# Patient Record
Sex: Male | Born: 1965 | Race: Black or African American | Hispanic: No | Smoking: Never smoker
Health system: Southern US, Community
[De-identification: ages and names within clinical notes are randomized; demographics above are authoritative.]

## PROBLEM LIST (undated history)

## (undated) DIAGNOSIS — E0590 Thyrotoxicosis, unspecified without thyrotoxic crisis or storm: Secondary | ICD-10-CM

## (undated) DIAGNOSIS — G63 Polyneuropathy in diseases classified elsewhere: Secondary | ICD-10-CM

## (undated) DIAGNOSIS — Z992 Dependence on renal dialysis: Secondary | ICD-10-CM

## (undated) DIAGNOSIS — E785 Hyperlipidemia, unspecified: Secondary | ICD-10-CM

## (undated) DIAGNOSIS — G4730 Sleep apnea, unspecified: Secondary | ICD-10-CM

## (undated) DIAGNOSIS — N186 End stage renal disease: Secondary | ICD-10-CM

## (undated) DIAGNOSIS — Z9884 Bariatric surgery status: Secondary | ICD-10-CM

## (undated) DIAGNOSIS — I10 Essential (primary) hypertension: Secondary | ICD-10-CM

## (undated) DIAGNOSIS — E349 Endocrine disorder, unspecified: Secondary | ICD-10-CM

## (undated) DIAGNOSIS — E079 Disorder of thyroid, unspecified: Secondary | ICD-10-CM

## (undated) HISTORY — PX: OTHER SURGICAL HISTORY: SHX169

---

## 2007-04-18 ENCOUNTER — Encounter: Admission: RE | Admit: 2007-04-18 | Discharge: 2007-04-18 | Payer: Self-pay | Admitting: Nephrology

## 2007-11-21 ENCOUNTER — Encounter: Admission: RE | Admit: 2007-11-21 | Discharge: 2007-11-21 | Payer: Self-pay | Admitting: Surgery

## 2009-03-12 HISTORY — PX: DIALYSIS FISTULA CREATION: SHX611

## 2009-05-30 ENCOUNTER — Ambulatory Visit: Payer: Self-pay | Admitting: Surgery

## 2009-06-10 ENCOUNTER — Ambulatory Visit (HOSPITAL_COMMUNITY): Admission: RE | Admit: 2009-06-10 | Discharge: 2009-06-10 | Payer: Self-pay | Admitting: Surgery

## 2009-06-10 ENCOUNTER — Ambulatory Visit: Payer: Self-pay | Admitting: Surgery

## 2009-07-11 ENCOUNTER — Ambulatory Visit: Payer: Self-pay | Admitting: Surgery

## 2009-10-10 ENCOUNTER — Ambulatory Visit: Payer: Self-pay | Admitting: Surgery

## 2009-10-29 ENCOUNTER — Encounter: Admission: RE | Admit: 2009-10-29 | Discharge: 2010-01-27 | Payer: Self-pay | Admitting: Surgery

## 2009-12-06 ENCOUNTER — Inpatient Hospital Stay (HOSPITAL_COMMUNITY)
Admission: RE | Admit: 2009-12-06 | Discharge: 2009-12-08 | Payer: Self-pay | Source: Home / Self Care | Admitting: Surgery

## 2009-12-06 HISTORY — PX: GASTRIC RESTRICTION SURGERY: SHX653

## 2009-12-07 ENCOUNTER — Encounter (INDEPENDENT_AMBULATORY_CARE_PROVIDER_SITE_OTHER): Payer: Self-pay | Admitting: Surgery

## 2009-12-07 ENCOUNTER — Ambulatory Visit: Payer: Self-pay | Admitting: Vascular Surgery

## 2009-12-20 ENCOUNTER — Encounter
Admission: RE | Admit: 2009-12-20 | Discharge: 2010-03-20 | Payer: Self-pay | Source: Home / Self Care | Attending: Surgery | Admitting: Surgery

## 2010-01-09 ENCOUNTER — Encounter: Payer: Self-pay | Admitting: Emergency Medicine

## 2010-01-09 ENCOUNTER — Inpatient Hospital Stay (HOSPITAL_COMMUNITY): Admission: EM | Admit: 2010-01-09 | Discharge: 2010-01-12 | Payer: Self-pay | Admitting: Internal Medicine

## 2010-01-23 ENCOUNTER — Emergency Department (HOSPITAL_COMMUNITY): Admission: EM | Admit: 2010-01-23 | Discharge: 2010-01-23 | Payer: Self-pay | Admitting: Emergency Medicine

## 2010-01-30 ENCOUNTER — Encounter
Admission: RE | Admit: 2010-01-30 | Discharge: 2010-04-11 | Payer: Self-pay | Source: Home / Self Care | Attending: Surgery | Admitting: Surgery

## 2010-02-09 ENCOUNTER — Encounter (HOSPITAL_COMMUNITY)
Admission: RE | Admit: 2010-02-09 | Discharge: 2010-04-11 | Payer: Self-pay | Source: Home / Self Care | Attending: Nephrology | Admitting: Nephrology

## 2010-02-16 ENCOUNTER — Encounter
Admission: RE | Admit: 2010-02-16 | Discharge: 2010-02-16 | Payer: Self-pay | Source: Home / Self Care | Attending: General Surgery | Admitting: General Surgery

## 2010-04-02 ENCOUNTER — Encounter: Payer: Self-pay | Admitting: Nephrology

## 2010-05-11 ENCOUNTER — Other Ambulatory Visit: Payer: Self-pay

## 2010-05-11 ENCOUNTER — Encounter (HOSPITAL_COMMUNITY): Payer: 59 | Attending: Nephrology

## 2010-05-11 DIAGNOSIS — D638 Anemia in other chronic diseases classified elsewhere: Secondary | ICD-10-CM | POA: Insufficient documentation

## 2010-05-11 DIAGNOSIS — N186 End stage renal disease: Secondary | ICD-10-CM

## 2010-05-11 DIAGNOSIS — N184 Chronic kidney disease, stage 4 (severe): Secondary | ICD-10-CM | POA: Insufficient documentation

## 2010-05-11 HISTORY — DX: Dependence on renal dialysis: N18.6

## 2010-05-23 LAB — CBC
MCH: 28.7 pg (ref 26.0–34.0)
MCHC: 33.7 g/dL (ref 30.0–36.0)
MCV: 85.2 fL (ref 78.0–100.0)
RBC: 3.48 MIL/uL — ABNORMAL LOW (ref 4.22–5.81)

## 2010-05-23 LAB — RENAL FUNCTION PANEL
Albumin: 2.9 g/dL — ABNORMAL LOW (ref 3.5–5.2)
Albumin: 3.2 g/dL — ABNORMAL LOW (ref 3.5–5.2)
BUN: 37 mg/dL — ABNORMAL HIGH (ref 6–23)
BUN: 39 mg/dL — ABNORMAL HIGH (ref 6–23)
CO2: 23 mEq/L (ref 19–32)
Creatinine, Ser: 5.27 mg/dL — ABNORMAL HIGH (ref 0.4–1.5)
GFR calc Af Amer: 14 mL/min — ABNORMAL LOW (ref >60)
GFR calc non Af Amer: 12 mL/min — ABNORMAL LOW (ref >60)
Glucose, Bld: 80 mg/dL (ref 70–99)
Phosphorus: 4.3 mg/dL (ref 2.3–4.6)
Potassium: 4 mEq/L (ref 3.5–5.1)
Potassium: 4.9 mEq/L (ref 3.5–5.1)

## 2010-05-23 LAB — COMPREHENSIVE METABOLIC PANEL
Alkaline Phosphatase: 68 U/L (ref 39–117)
Calcium: 8.1 mg/dL — ABNORMAL LOW (ref 8.4–10.5)
Chloride: 109 mEq/L (ref 96–112)
Creatinine, Ser: 5.48 mg/dL — ABNORMAL HIGH (ref 0.4–1.5)
GFR calc non Af Amer: 11 mL/min — ABNORMAL LOW (ref >60)
Glucose, Bld: 141 mg/dL — ABNORMAL HIGH (ref 70–99)
Total Bilirubin: 0.6 mg/dL (ref 0.3–1.2)
Total Protein: 8.3 g/dL (ref 6.0–8.3)

## 2010-05-23 LAB — DIFFERENTIAL
Eosinophils Absolute: 0 10*3/uL (ref 0.0–0.7)
Lymphs Abs: 0.6 10*3/uL — ABNORMAL LOW (ref 0.7–4.0)
Monocytes Relative: 3 % (ref 3–12)
Neutrophils Relative %: 91 % — ABNORMAL HIGH (ref 43–77)

## 2010-05-23 LAB — LIPASE, BLOOD
Lipase: 37 U/L (ref 11–59)
Lipase: 46 U/L (ref 11–59)

## 2010-05-24 LAB — BASIC METABOLIC PANEL
CO2: 19 mEq/L (ref 19–32)
Calcium: 7.9 mg/dL — ABNORMAL LOW (ref 8.4–10.5)
Creatinine, Ser: 5.78 mg/dL — ABNORMAL HIGH (ref 0.4–1.5)
GFR calc non Af Amer: 11 mL/min — ABNORMAL LOW (ref >60)

## 2010-05-24 LAB — CBC
MCHC: 33.9 g/dL (ref 30.0–36.0)
Platelets: 151 10*3/uL (ref 150–400)
RBC: 4.06 MIL/uL — ABNORMAL LOW (ref 4.22–5.81)
WBC: 7.7 10*3/uL (ref 4.0–10.5)

## 2010-05-24 LAB — GLUCOSE, CAPILLARY: Glucose-Capillary: 147 mg/dL — ABNORMAL HIGH (ref 70–99)

## 2010-05-24 LAB — COMPREHENSIVE METABOLIC PANEL
AST: 24 U/L (ref 0–37)
Alkaline Phosphatase: 89 U/L (ref 39–117)
BUN: 55 mg/dL — ABNORMAL HIGH (ref 6–23)
CO2: 17 mEq/L — ABNORMAL LOW (ref 19–32)
Chloride: 114 mEq/L — ABNORMAL HIGH (ref 96–112)
GFR calc non Af Amer: 9 mL/min — ABNORMAL LOW (ref >60)
Glucose, Bld: 151 mg/dL — ABNORMAL HIGH (ref 70–99)
Sodium: 142 mEq/L (ref 135–145)
Total Bilirubin: 0.6 mg/dL (ref 0.3–1.2)
Total Protein: 8.3 g/dL (ref 6.0–8.3)

## 2010-05-24 LAB — DIFFERENTIAL
Eosinophils Relative: 0 % (ref 0–5)
Monocytes Absolute: 0.1 10*3/uL (ref 0.1–1.0)
Monocytes Relative: 2 % — ABNORMAL LOW (ref 3–12)
Neutrophils Relative %: 93 % — ABNORMAL HIGH (ref 43–77)

## 2010-05-25 LAB — BASIC METABOLIC PANEL
BUN: 90 mg/dL — ABNORMAL HIGH (ref 6–23)
CO2: 15 mEq/L — ABNORMAL LOW (ref 19–32)
CO2: 17 mEq/L — ABNORMAL LOW (ref 19–32)
Calcium: 7.1 mg/dL — ABNORMAL LOW (ref 8.4–10.5)
Chloride: 114 mEq/L — ABNORMAL HIGH (ref 96–112)
GFR calc Af Amer: 11 mL/min — ABNORMAL LOW (ref >60)
GFR calc non Af Amer: 8 mL/min — ABNORMAL LOW (ref >60)
GFR calc non Af Amer: 9 mL/min — ABNORMAL LOW (ref >60)
Glucose, Bld: 185 mg/dL — ABNORMAL HIGH (ref 70–99)
Potassium: 5.3 mEq/L — ABNORMAL HIGH (ref 3.5–5.1)
Potassium: 5.8 mEq/L — ABNORMAL HIGH (ref 3.5–5.1)
Sodium: 138 mEq/L (ref 135–145)
Sodium: 141 mEq/L (ref 135–145)

## 2010-05-25 LAB — CBC
HCT: 31.1 % — ABNORMAL LOW (ref 39.0–52.0)
Hemoglobin: 10.3 g/dL — ABNORMAL LOW (ref 13.0–17.0)
MCH: 28.1 pg (ref 26.0–34.0)
MCH: 28.5 pg (ref 26.0–34.0)
MCHC: 34.4 g/dL (ref 30.0–36.0)
MCV: 82.9 fL (ref 78.0–100.0)
RBC: 3.67 MIL/uL — ABNORMAL LOW (ref 4.22–5.81)
WBC: 8.9 10*3/uL (ref 4.0–10.5)

## 2010-05-25 LAB — COMPREHENSIVE METABOLIC PANEL
BUN: 89 mg/dL — ABNORMAL HIGH (ref 6–23)
CO2: 19 mEq/L (ref 19–32)
Calcium: 7.6 mg/dL — ABNORMAL LOW (ref 8.4–10.5)
Creatinine, Ser: 6.94 mg/dL — ABNORMAL HIGH (ref 0.4–1.5)
GFR calc Af Amer: 11 mL/min — ABNORMAL LOW (ref >60)
Glucose, Bld: 91 mg/dL (ref 70–99)
Sodium: 139 mEq/L (ref 135–145)
Total Bilirubin: 0.8 mg/dL (ref 0.3–1.2)
Total Protein: 8.2 g/dL (ref 6.0–8.3)

## 2010-05-25 LAB — DIFFERENTIAL
Eosinophils Absolute: 0.1 10*3/uL (ref 0.0–0.7)
Eosinophils Relative: 1 % (ref 0–5)
Lymphocytes Relative: 20 % (ref 12–46)
Lymphs Abs: 1.4 10*3/uL (ref 0.7–4.0)
Lymphs Abs: 1.7 10*3/uL (ref 0.7–4.0)
Monocytes Absolute: 0.6 10*3/uL (ref 0.1–1.0)
Monocytes Absolute: 0.6 10*3/uL (ref 0.1–1.0)
Monocytes Relative: 7 % (ref 3–12)
Monocytes Relative: 7 % (ref 3–12)
Neutro Abs: 7.5 10*3/uL (ref 1.7–7.7)
Neutrophils Relative %: 77 % (ref 43–77)

## 2010-05-25 LAB — SURGICAL PCR SCREEN: Staphylococcus aureus: NEGATIVE

## 2010-05-26 ENCOUNTER — Other Ambulatory Visit: Payer: Self-pay | Admitting: Nephrology

## 2010-05-26 ENCOUNTER — Other Ambulatory Visit: Payer: Self-pay

## 2010-05-26 ENCOUNTER — Encounter (HOSPITAL_COMMUNITY): Payer: 59

## 2010-05-26 LAB — PHOSPHORUS: Phosphorus: 7.5 mg/dL — ABNORMAL HIGH (ref 2.3–4.6)

## 2010-05-26 LAB — COMPREHENSIVE METABOLIC PANEL
ALT: 25 U/L (ref 0–53)
Alkaline Phosphatase: 76 U/L (ref 39–117)
CO2: 21 mEq/L (ref 19–32)
GFR calc non Af Amer: 6 mL/min — ABNORMAL LOW (ref >60)
Glucose, Bld: 92 mg/dL (ref 70–99)
Potassium: 5.4 mEq/L — ABNORMAL HIGH (ref 3.5–5.1)
Sodium: 140 mEq/L (ref 135–145)
Total Bilirubin: 0.6 mg/dL (ref 0.3–1.2)

## 2010-05-31 LAB — POCT I-STAT 4, (NA,K, GLUC, HGB,HCT)
Glucose, Bld: 91 mg/dL (ref 70–99)
HCT: 37 % — ABNORMAL LOW (ref 39.0–52.0)
Hemoglobin: 12.6 g/dL — ABNORMAL LOW (ref 13.0–17.0)

## 2010-06-09 ENCOUNTER — Encounter (HOSPITAL_COMMUNITY): Payer: 59

## 2010-07-25 NOTE — Procedures (Signed)
CEPHALIC VEIN MAPPING   INDICATION:  Evaluation for AVF placement.   HISTORY:  Renal failure.   EXAM:   The right cephalic vein is compressible.   Diameter measurements range from 0.25 to 0.62 cm.   The right basilic vein is compressible.   Diameter measurements range from 0.30 to 0.69 cm.   The left cephalic vein is compressible.   Diameter measurements range from 0.26 to 0.41 cm.   The left basilic vein is compressible.   Diameter measurements range from 0.28 to 0.32 cm in the forearm only.   See attached worksheet for all measurements.   IMPRESSION:  Patient's bilateral cephalic and right basilic veins are of  acceptable diameter for use as a dialysis access site.   ___________________________________________  V. Charlena Cross, MD   CJ/MEDQ  D:  05/30/2009  T:  05/31/2009  Job:  [id number]

## 2010-07-25 NOTE — Procedures (Signed)
VASCULAR LAB EXAM   INDICATION:  Status post left arm AV fistula.   HISTORY:  Diabetes:  Cardiac:  Hypertension:   EXAM:  Left upper extremity AV fistula Duplex.   IMPRESSION:  1. Patent left radiocephalic arteriovenous graft fistula with an      increased velocity of 673 cm/sec noted at the anastomosis with no      focal internal narrowing noted.  2. Chronic partially occlusive fibrous thrombus noted in the left      proximal antecubital fossa level outflow vein with an increased      velocity of 415 cm/sec noted.  3. Patent branches noted in the left arm outflow veins, as described      on the attached worksheet.  4. Doppler velocities and waveforms of the left radial artery distal      to the anastomosis, with and without fistular compression, suggests      a possible significant fistular steal.  5. Additional depth, diameter, and velocity measurements are noted on      the attached worksheet.   ___________________________________________  V. Charlena Cross, MD   CH/MEDQ  D:  10/11/2009  T:  10/11/2009  Job:  [id number]

## 2010-07-25 NOTE — Assessment & Plan Note (Signed)
OFFICE VISIT   Randall [name], Randall [name]  DOB:  05/03/1965                                       07/11/2009  CHART#:[id number]   The patient comes back in today.  He is status post left radiocephalic  fistula placed on June 10, 2009.  He is doing well at this time.  He has  no complaints.  He has no complaints of steal syndrome.  He has no  numbness.  On examination the cephalic vein is readily palpable in the  forearm.  It has not quite fully matured.  He is due to see Dr. Caryn Section back  in 4 months.  I told that I would like to see him back in 2 months with  an ultrasound to evaluate his fistula to see if there is anything that  may need to be done for his fistula to reach full maturation.  Again I  will see him back in 2 months.     Jorge Ny, MD  Electronically Signed   VWB/MEDQ  D:  07/11/2009  T:  07/12/2009  Job:  [id number]   cc:   Dr. Caryn Section

## 2010-07-25 NOTE — Assessment & Plan Note (Signed)
OFFICE VISIT   ESPN, ZEMAN  DOB:  03-02-1966                                       05/30/2009  CHART#:[id number]   REASON FOR CONSULT:  Dialysis access.   REFERRING PHYSICIAN:  Dr. Caryn Section.   HISTORY:  This is a very pleasant 45 year old gentleman with chronic  kidney disease secondary to hypertension.  He comes in today to discuss  permanent access.  He is right-handed.   The patient is status post Roux-en-Y laparoscopic gastric bypass in 2002  and cholecystectomy in 2009.  He has been made inactive on the  transplant list secondary to his weight; therefore, he comes in today  for his first access.   REVIEW OF SYSTEMS:  Negative for chest pain, negative for shortness of  breath.  Positive for weight loss.  Current weight is 317.  All other  review of systems are negative.   PAST MEDICAL HISTORY:  Hypertension, hypercholesterolemia, chronic  kidney disease.   FAMILY HISTORY:  Negative for cardiovascular disease at an early age.   SOCIAL HISTORY:  He is married with 3 children.  He works as an Therapist, music.  Does not drink or smoke.   CURRENT MEDICATIONS:  Please see medical record.   ALLERGIES:  None.   PHYSICAL EXAMINATION:  Heart rate 90, blood pressure 164/107,  temperature is 97.4.  general:  Well-appearing in no distress.  HEENT:  Within normal limits.  Respirations are nonlabored.  Cardiovascular:  Regular rate and rhythm.  Abdomen:  Soft, obese. Musculoskeletal:  No  major deformities.  Neuro:  No focal weaknesses.  Skin:  Without rash.  Extremities:  He has palpable left radial pulse.   DIAGNOSTIC STUDIES:  Vein mapping was performed today.  He has an  adequate left cephalic vein.   PLAN:  The patient will be scheduled for a left radiocephalic fistula on  Friday, April 1.  The risks and benefits were discussed with the  patient, including the need for revisions, risk of steal syndrome, and  nonmaturity.  All of his  questions were answered today.     Jorge Ny, MD  Electronically Signed   VWB/MEDQ  D:  05/30/2009  T:  05/31/2009  Job:  [id number]   cc:   Dr. Caryn Section

## 2010-07-25 NOTE — Assessment & Plan Note (Signed)
OFFICE VISIT   KONSTANTINOS, CORDOBA  DOB:  Dec 10, 1965                                       10/10/2009  CHART#:[id number]   The patient comes back today for follow-up.  He is status post left  radiocephalic fistula placed on June 10, 2009.  He is not yet on  dialysis.  He had no complaints at this time.  He continues to be  medically managed for his hypertension and hypercholesterolemia.   REVIEW OF SYSTEMS:  GENERAL:  Positive for weight gain.  GI:  Positive for diarrhea.  GU:  Positive for kidney disease.  All other review systems are negative as documented in the encounter  form.   PHYSICAL EXAMINATION:  Vital signs:  Heart rate 82, blood pressure  131/83, temperature is 97.9.  General;  Well-appearing, in no distress.  Cardiovascular:  He has a palpable thrill in his left radiocephalic  fistula up to the antecubital crease.  No evidence of steal syndrome.  Respirations are nonlabored.  Skin:  Without rash.   DIAGNOSTICS:  I have ordered and reviewed his fistula study. This  reveals elevated velocities at the anastomosis.  However, no focal  narrowing is identified.  There is chronic partially occlusive fibrous  thrombus in the cephalic vein in the antecubital crease and the fistula  is approximately 5-6 mm deep.   ASSESSMENT/PLAN:  Status post left radiocephalic fistula.   PLAN:  By physical exam, I would expect this fistula to be able to be  used once he initiates dialysis.  The only concern I would have would be  its depth.  It is approximately 5-6 mm deep.  I can easily palpate a  thrill and so I do not anticipate having trouble with cannulation.  Should that be an issue we consider transposing it under4 the skin,  however, I would not recommend doing that until we start having access  limitations.  I have not scheduled the patient to come back to see me.  He can call me at any time.     Jorge Ny, MD  Electronically Signed   VWB/MEDQ  D:  10/10/2009  T:  10/11/2009  Job:  [id number]   cc:   Wilber Bihari. Caryn Section, M.D.

## 2010-08-15 ENCOUNTER — Encounter (INDEPENDENT_AMBULATORY_CARE_PROVIDER_SITE_OTHER): Payer: Self-pay | Admitting: Surgery

## 2010-09-15 ENCOUNTER — Encounter (INDEPENDENT_AMBULATORY_CARE_PROVIDER_SITE_OTHER): Payer: 59

## 2010-10-06 ENCOUNTER — Encounter (INDEPENDENT_AMBULATORY_CARE_PROVIDER_SITE_OTHER): Payer: Self-pay

## 2010-10-06 ENCOUNTER — Ambulatory Visit (INDEPENDENT_AMBULATORY_CARE_PROVIDER_SITE_OTHER): Payer: 59 | Admitting: Physician Assistant

## 2010-10-06 VITALS — BP 116/82 | Ht 71.0 in | Wt 248.8 lb

## 2010-10-06 DIAGNOSIS — Z4651 Encounter for fitting and adjustment of gastric lap band: Secondary | ICD-10-CM

## 2010-10-06 NOTE — Patient Instructions (Signed)
Take clear liquids for the next 48 hours. Thin protein shakes are ok to start on Saturday evening. Call us if you have persistent vomiting or regurgitation, night cough or reflux symptoms. Return as scheduled or sooner if you notice no changes in hunger/portion sizes.   

## 2010-10-06 NOTE — Progress Notes (Signed)
  HISTORY: Kort-Patrick Markgraf is a 45 y.o.male who received an AP-Large lap-band over gastric bypass in September 2011 by Dr. Daphine Deutscher. He comes in today having last been seen in early May and has since lost about 8 pounds which he regained over the past couple of weeks. He has noticed that his hunger has increased and portion sizes have increased as well and feels like an adjustment is needed. He denies persistent regurgitation or vomiting symptoms.  VITAL SIGNS: Filed Vitals:   10/06/10 1409  BP: 116/82    PHYSICAL EXAM: Physical exam reveals a very well-appearing 45 y.o.male in no apparent distress Neurologic: Awake, alert, oriented Psych: Bright affect, conversant Respiratory: Breathing even and unlabored. No stridor or wheezing Abdomen: Soft, nontender, nondistended to palpation. Incisions well-healed. No incisional hernias. Port easily palpated. Extremities: Atraumatic, good range of motion.  ASSESMENT: 45 y.o.  male  s/p AP-Large lap-band over gastric bypass. He could benefit from an adjustment today.  PLAN: The patient's port was accessed with a 20G Huber needle without difficulty. Clear fluid was aspirated and 1 mL saline was added to the port. The patient was able to swallow water without difficulty following the procedure and was instructed to take clear liquids for the next 24-48 hours and advance slowly as tolerated.

## 2010-11-02 ENCOUNTER — Encounter (INDEPENDENT_AMBULATORY_CARE_PROVIDER_SITE_OTHER): Payer: Self-pay | Admitting: Surgery

## 2010-11-03 ENCOUNTER — Ambulatory Visit (INDEPENDENT_AMBULATORY_CARE_PROVIDER_SITE_OTHER): Payer: 59

## 2010-11-19 ENCOUNTER — Emergency Department (HOSPITAL_COMMUNITY): Payer: 59

## 2010-11-19 ENCOUNTER — Inpatient Hospital Stay (HOSPITAL_COMMUNITY)
Admission: EM | Admit: 2010-11-19 | Discharge: 2010-11-22 | DRG: 391 | Disposition: A | Discharge status: Home / Self Care | Payer: 59 | Attending: General Surgery | Admitting: General Surgery

## 2010-11-19 DIAGNOSIS — D649 Anemia, unspecified: Secondary | ICD-10-CM | POA: Diagnosis present

## 2010-11-19 DIAGNOSIS — K219 Gastro-esophageal reflux disease without esophagitis: Secondary | ICD-10-CM | POA: Diagnosis present

## 2010-11-19 DIAGNOSIS — R1013 Epigastric pain: Principal | ICD-10-CM | POA: Diagnosis present

## 2010-11-19 DIAGNOSIS — Z9884 Bariatric surgery status: Secondary | ICD-10-CM

## 2010-11-19 DIAGNOSIS — R109 Unspecified abdominal pain: Secondary | ICD-10-CM

## 2010-11-19 DIAGNOSIS — N2581 Secondary hyperparathyroidism of renal origin: Secondary | ICD-10-CM | POA: Diagnosis present

## 2010-11-19 DIAGNOSIS — E669 Obesity, unspecified: Secondary | ICD-10-CM | POA: Diagnosis present

## 2010-11-19 DIAGNOSIS — I120 Hypertensive chronic kidney disease with stage 5 chronic kidney disease or end stage renal disease: Secondary | ICD-10-CM | POA: Diagnosis present

## 2010-11-19 DIAGNOSIS — N186 End stage renal disease: Secondary | ICD-10-CM | POA: Diagnosis present

## 2010-11-19 DIAGNOSIS — T503X5A Adverse effect of electrolytic, caloric and water-balance agents, initial encounter: Secondary | ICD-10-CM | POA: Diagnosis present

## 2010-11-19 LAB — BASIC METABOLIC PANEL
BUN: 61 mg/dL — ABNORMAL HIGH (ref 6–23)
Creatinine, Ser: 13.62 mg/dL — ABNORMAL HIGH (ref 0.50–1.35)
GFR calc Af Amer: 5 mL/min — ABNORMAL LOW (ref >60)
GFR calc non Af Amer: 4 mL/min — ABNORMAL LOW (ref >60)

## 2010-11-19 LAB — COMPREHENSIVE METABOLIC PANEL
Alkaline Phosphatase: 86 U/L (ref 39–117)
BUN: 52 mg/dL — ABNORMAL HIGH (ref 6–23)
Chloride: 92 mEq/L — ABNORMAL LOW (ref 96–112)
GFR calc Af Amer: 5 mL/min — ABNORMAL LOW (ref >60)
GFR calc non Af Amer: 4 mL/min — ABNORMAL LOW (ref >60)
Glucose, Bld: 141 mg/dL — ABNORMAL HIGH (ref 70–99)
Potassium: 4.5 mEq/L (ref 3.5–5.1)
Total Bilirubin: 0.3 mg/dL (ref 0.3–1.2)
Total Protein: 8.5 g/dL — ABNORMAL HIGH (ref 6.0–8.3)

## 2010-11-19 LAB — CBC
Hemoglobin: 13.6 g/dL (ref 13.0–17.0)
MCH: 31.6 pg (ref 26.0–34.0)
MCHC: 33.6 g/dL (ref 30.0–36.0)
MCHC: 33.9 g/dL (ref 30.0–36.0)
MCV: 93.1 fL (ref 78.0–100.0)
Platelets: 191 10*3/uL (ref 150–400)
RBC: 4.33 MIL/uL (ref 4.22–5.81)
RDW: 15.3 % (ref 11.5–15.5)

## 2010-11-19 LAB — LIPASE, BLOOD: Lipase: 31 U/L (ref 11–59)

## 2010-11-19 LAB — RENAL FUNCTION PANEL
Albumin: 4.1 g/dL (ref 3.5–5.2)
Calcium: 9.8 mg/dL (ref 8.4–10.5)
Creatinine, Ser: 13.39 mg/dL — ABNORMAL HIGH (ref 0.50–1.35)
GFR calc non Af Amer: 4 mL/min — ABNORMAL LOW (ref >60)

## 2010-11-19 LAB — POCT I-STAT TROPONIN I: Troponin i, poc: 0 ng/mL (ref 0.00–0.08)

## 2010-11-19 LAB — DIFFERENTIAL
Basophils Absolute: 0 10*3/uL (ref 0.0–0.1)
Basophils Relative: 0 % (ref 0–1)
Monocytes Absolute: 0.7 10*3/uL (ref 0.1–1.0)
Neutro Abs: 9.3 10*3/uL — ABNORMAL HIGH (ref 1.7–7.7)
Neutrophils Relative %: 72 % (ref 43–77)

## 2010-11-19 LAB — CK TOTAL AND CKMB (NOT AT ARMC): Total CK: 627 U/L — ABNORMAL HIGH (ref 7–232)

## 2010-11-19 MED ORDER — IOHEXOL 300 MG/ML  SOLN
100.0000 mL | Freq: Once | INTRAMUSCULAR | Status: AC | PRN
  Administered 2010-11-19: 100 mL via INTRAVENOUS

## 2010-11-19 NOTE — H&P (Signed)
Randall [name], Randall [name] NO.:  [id number]  MEDICAL RECORD NO.:  [id number]  LOCATION:  [facility]                         FACILITY:  [facility]  PHYSICIAN:  Juanetta Gosling, MDDATE OF BIRTH:  October 15, 1965  DATE OF ADMISSION:  11/19/2010 DATE OF DISCHARGE:                             HISTORY & PHYSICAL   CHIEF COMPLAINT:  Upper abdominal pain, nausea, and vomiting.  HISTORY OF PRESENT ILLNESS:  This is a 45 year old male who has a history of Roux-en-Y gastric bypass in 2001 followed by a laparoscopic adjustable band for failure of weight loss in 2011.  He had an episode about a month ago which he associates with taking his PhosLo where he had some difficulty taking some food and had some epigastric pain and some bloating.  This improved when he stopped his PhosLo.  However, his phosphorus increased and he then began on Wednesday having difficulty with taking any food in at that point.  He is not able to eat right now and the food always comes up.  The liquids are able to stay down, but he has some significant epigastric pain as well as some bloating.  He also has pain at rest at this point as well.  He has no fevers.  He does have some sweats with the episodes.  His last band adjustment he thinks was about 2 months ago.  He has got no diarrhea nor sick contacts.  He is passing flatus and having bowel movements as well.  PAST SURGICAL HISTORY:  Left Cimino fistula, Roux-en-Y gastric bypass, laparoscopic adjustable band, and lap chole.  PAST MEDICAL HISTORY:  End stage renal disease on Monday, Wednesday, and Friday, followed by Dr. Darrick Penna.  He says he has no other medical problems, although he has morbid obesity and hypertension as listed in his old medical problems as well.  SOCIAL HISTORY:  Nonsmoker.  Does not drink alcohol.  DRUG ALLERGIES:  None known.  MEDICATIONS:  PhosLo.  REVIEW OF SYSTEMS:  Otherwise negative.  PHYSICAL EXAMINATION:  VITAL SIGNS:   Temperature 98.4, heart rate 96, respiratory rate 20, and blood pressure 131/105. GENERAL:  He is an ill-appearing male who is uncomfortable. NECK:  Supple without adenopathy. HEART:  Regular rate and rhythm. LUNGS:  Clear bilaterally. ABDOMEN:  Mildly tender in his upper abdomen.  He has not peritonitis. He has well-healed incisions throughout with a palpable port.  LABORATORY EVALUATION:  It shows him to have a normal troponin, lipase is 31.  CMP significant for chloride of 92, glucose of 141, BUN 52, creatinine 12.62.  His liver function tests are otherwise within normal limits.  His PT and INR are normal.  CBC shows a mildly elevated white blood cell count of 12.8 with no left shift, hematocrit 40.5, and platelets of 187.  A CT scan of his abdomen and pelvis today shows some mild prominence of his intrahepatic biliary ducts and some mild soft tissue stranding around this through the mesentery and his iliac trunk with a mildly prominent 0.9-cm node there as well.  He otherwise has evidence of his gastric band in his bypass as well.  ASSESSMENT:  Upper abdominal pain, nausea, and vomiting.  PLAN:  I discussed this with his surgeon, Dr.  Daphine Deutscher.  I am going to plan on extracting some fluid from his band, obtaining an upper GI after that and we will plan on admitting him as well for further evaluation and entirely sure of the etiology of his symptoms.  I have also notified the Renal Service for his dialysis tomorrow.     Juanetta Gosling, MD     MCW/MEDQ  D:  11/19/2010  T:  11/19/2010  Job:  [id number]  cc:   [place] Daphine Deutscher, MD Dr. Darrick Penna  Electronically Signed by Emelia Loron MD on 11/19/2010 02:07:47 PM

## 2010-11-20 ENCOUNTER — Inpatient Hospital Stay (HOSPITAL_COMMUNITY): Payer: 59

## 2010-11-20 LAB — RENAL FUNCTION PANEL
Albumin: 3.8 g/dL (ref 3.5–5.2)
BUN: 68 mg/dL — ABNORMAL HIGH (ref 6–23)
Calcium: 9.2 mg/dL (ref 8.4–10.5)
Phosphorus: 8.4 mg/dL — ABNORMAL HIGH (ref 2.3–4.6)
Potassium: 4.6 mEq/L (ref 3.5–5.1)
Sodium: 136 mEq/L (ref 135–145)

## 2010-11-20 LAB — CBC
HCT: 37.4 % — ABNORMAL LOW (ref 39.0–52.0)
MCH: 30.7 pg (ref 26.0–34.0)
MCHC: 32.6 g/dL (ref 30.0–36.0)
RDW: 15.4 % (ref 11.5–15.5)

## 2010-11-21 ENCOUNTER — Inpatient Hospital Stay (HOSPITAL_COMMUNITY): Payer: 59

## 2010-11-21 LAB — BASIC METABOLIC PANEL
BUN: 40 mg/dL — ABNORMAL HIGH (ref 6–23)
Calcium: 8.9 mg/dL (ref 8.4–10.5)
Chloride: 91 mEq/L — ABNORMAL LOW (ref 96–112)
Creatinine, Ser: 11.37 mg/dL — ABNORMAL HIGH (ref 0.50–1.35)
GFR calc Af Amer: 6 mL/min — ABNORMAL LOW (ref >60)
GFR calc non Af Amer: 5 mL/min — ABNORMAL LOW (ref >60)

## 2010-11-21 LAB — CBC
MCHC: 32.9 g/dL (ref 30.0–36.0)
MCV: 93.6 fL (ref 78.0–100.0)
Platelets: 208 10*3/uL (ref 150–400)
RDW: 15.4 % (ref 11.5–15.5)
WBC: 8.3 10*3/uL (ref 4.0–10.5)

## 2010-11-21 LAB — MAGNESIUM: Magnesium: 2.3 mg/dL (ref 1.5–2.5)

## 2010-11-22 ENCOUNTER — Inpatient Hospital Stay (HOSPITAL_COMMUNITY): Payer: 59

## 2010-11-22 LAB — RENAL FUNCTION PANEL
Calcium: 8.4 mg/dL (ref 8.4–10.5)
Creatinine, Ser: 15.11 mg/dL — ABNORMAL HIGH (ref 0.50–1.35)
GFR calc Af Amer: 4 mL/min — ABNORMAL LOW (ref >60)
GFR calc non Af Amer: 4 mL/min — ABNORMAL LOW (ref >60)
Phosphorus: 7.1 mg/dL — ABNORMAL HIGH (ref 2.3–4.6)
Sodium: 133 mEq/L — ABNORMAL LOW (ref 135–145)

## 2010-11-22 LAB — CBC
MCH: 31.2 pg (ref 26.0–34.0)
MCHC: 33.9 g/dL (ref 30.0–36.0)
MCV: 92 fL (ref 78.0–100.0)
Platelets: 181 10*3/uL (ref 150–400)
RDW: 15.1 % (ref 11.5–15.5)

## 2010-11-27 ENCOUNTER — Other Ambulatory Visit (HOSPITAL_COMMUNITY): Payer: Self-pay | Admitting: Nephrology

## 2010-11-27 DIAGNOSIS — Z01810 Encounter for preprocedural cardiovascular examination: Secondary | ICD-10-CM

## 2010-11-28 ENCOUNTER — Ambulatory Visit (HOSPITAL_COMMUNITY): Payer: 59 | Attending: Nephrology | Admitting: Radiology

## 2010-11-28 VITALS — Ht 71.0 in | Wt 250.0 lb

## 2010-11-28 DIAGNOSIS — Z01810 Encounter for preprocedural cardiovascular examination: Secondary | ICD-10-CM | POA: Insufficient documentation

## 2010-11-28 DIAGNOSIS — R0602 Shortness of breath: Secondary | ICD-10-CM

## 2010-11-28 MED ORDER — TECHNETIUM TC 99M TETROFOSMIN IV KIT
11.0000 | PACK | Freq: Once | INTRAVENOUS | Status: AC | PRN
  Administered 2010-11-28: 11 via INTRAVENOUS

## 2010-11-28 MED ORDER — TECHNETIUM TC 99M TETROFOSMIN IV KIT
33.0000 | PACK | Freq: Once | INTRAVENOUS | Status: AC | PRN
  Administered 2010-11-28: 33 via INTRAVENOUS

## 2010-11-28 NOTE — Progress Notes (Signed)
[facility] SITE 3 NUCLEAR MED [address] [place] [address] [phone]  Cardiology Nuclear Med Study  Randall [name] is a 45 y.o. male [id number] May 03, 1965   Nuclear Med Background Indication for Stress Test:  Evaluation for Ischemia, Surgical Clearance: pending transplant, and Post Hospital: 11/22/10 Nausea, Vomiting, Epigastric pain, and Dysphagia History:  No previous documented CAD and '10 Myocardial Perfusion Study: NL per patient, done at [facility], ESRD with Dialysis,and  Gastric Bypass  Cardiac Risk Factors: Hypertension  Symptoms: DOE, Dizziness and Light-Headedness after dialysis due to hypotension   Nuclear Pre-Procedure Caffeine/Decaff Intake:  None NPO After: 8:30pm   Lungs:  Clear IV 0.9% NS with Angio Cath:  20g  IV Site: R Antecubital  IV Started by:  Stanton Kidney, EMT-P  Chest Size (in):  52 Cup Size: n/a  Height: 5\' 11"  (1.803 m)  Weight:  250 lb (113.399 kg)  BMI:  Body mass index is 34.87 kg/(m^2). Tech Comments:  n/a    Nuclear Med Study 1 or 2 day study: 1 day  Stress Test Type:  Stress  Reading MD: Kristeen Miss, MD  Order Authorizing Provider:  Beryle Lathe, MD  Resting Radionuclide: Technetium 82m Tetrofosmin  Resting Radionuclide Dose: 11.0 mCi   Stress Radionuclide:  Technetium 90m Tetrofosmin  Stress Radionuclide Dose: 33.0 mCi           Stress Protocol Rest HR: 85 Stress HR: 153  Rest BP: 98/73 Stress BP: 158/63  Exercise Time (min): 6:15 METS: 7.4   Predicted Max HR: 175 bpm % Max HR: 87.43 bpm Rate Pressure Product: 52841   Dose of Adenosine (mg):  n/a Dose of Lexiscan: n/a mg  Dose of Atropine (mg): n/a Dose of Dobutamine: n/a mcg/kg/min (at max HR)  Stress Test Technologist: Irean Hong, RN  Nuclear Technologist:  Domenic Polite, CNMT     Rest Procedure:  Myocardial perfusion imaging was performed at rest 45 minutes following the intravenous administration of Technetium 67m Tetrofosmin. Rest ECG:  NSR  Stress Procedure:  The patient exercised for 6 minutes and 15 seconds, RPE=15.  The patient stopped due to DOE and Fatigue and denied any chest pain.  There were no significant ST-T wave changes. There was a drop in BP to 119/61 immediately post exercise. There were rare PAC,PVC, and Fusion PVC. Technetium 52m Tetrofosmin was injected at peak exercise and myocardial perfusion imaging was performed after a brief delay. Stress ECG: No significant change from baseline ECG  QPS Raw Data Images:  Normal; no motion artifact; normal heart/lung ratio. Stress Images:  Normal homogeneous uptake in all areas of the myocardium. Rest Images:  Normal homogeneous uptake in all areas of the myocardium. Subtraction (SDS):  No evidence of ischemia. Transient Ischemic Dilatation (Normal <1.22):  0.94 Lung/Heart Ratio (Normal <0.45):  0.34  Quantitative Gated Spect Images QGS EDV:  90 ml QGS ESV:  41 ml QGS cine images:  NL LV Function; NL Wall Motion QGS EF: 54%  Impression Exercise Capacity:  Fair exercise capacity. BP Response:  Normal blood pressure response. Clinical Symptoms:  No chest pain. ECG Impression:  No significant ST segment change suggestive of ischemia. Comparison with Prior Nuclear Study: No images to compare  Overall Impression:  Normal stress nuclear study.  No evidence of ischemia.  Normal LV function.    Vesta Mixer, Montez Hageman., MD, [place]

## 2010-11-29 ENCOUNTER — Encounter (INDEPENDENT_AMBULATORY_CARE_PROVIDER_SITE_OTHER): Payer: 59 | Admitting: Surgery

## 2010-11-29 ENCOUNTER — Encounter (INDEPENDENT_AMBULATORY_CARE_PROVIDER_SITE_OTHER): Payer: 59

## 2010-12-11 NOTE — Discharge Summary (Signed)
Randall [name], Randall [name]        ACCOUNT NO.:  [id number]  MEDICAL RECORD NO.:  [id number]  LOCATION:  5011                         FACILITY:  [facility]  PHYSICIAN:  Sharlet Salina T. Anneliese Leblond, M.D.DATE OF BIRTH:  1965/07/31  DATE OF ADMISSION:  11/19/2010 DATE OF DISCHARGE:  11/22/2010                              DISCHARGE SUMMARY   HISTORY OF PRESENT ILLNESS:  Randall [name] is a 45 year old African American gentleman with a history of Roux-en-Y gastric bypass followed by lap adjustable bands in 2011.  He is an end-stage renal disease patient on chronic hemodialysis.  He started having some symptoms of difficulty swallowing solid foods and having some bloating.  He states that this has happened before when taking his PhosLo binder and therefore he discontinued it.  He had actually had been symptom free. However, his lab draws as an outpatient showed that his phosphorus was elevated and therefore he started taking the PhosLo again.  He reported shortly after that point he started having the same symptoms of discomfort in his epigastric region associated with nausea, vomiting and bloating.  Some of the liquids were being able to stay down but he really could not tolerate any significant solid intake.  He denies any fever, chills.  He has had no change in his bowel movements.  Dr. Dwain Sarna saw the patient and felt the patient needed admission for observation.  He also removed approximately 2 mL of fluid from his lap adjustable band.  The patient was subsequently admitted.  SUMMARY OF HOSPITAL COURSE:  The patient was admitted on November 19, 2010.  He started feeling somewhat better in the first 24 hours after the band was removed and his PhosLo was discontinued.  The Nephrology Service was consulted to continue his dialysis management as an inpatient.  An upper GI study was ordered, however, this was delayed due to the  residual CT contrast been present in his colon that precluded them  from being able to visualize what they needed to see.  As we were waiting on this to be completed, the patient's symptoms significantly improved.  He was started on clear liquid diet and has tolerated that quite well.  The Nephrology Team has subsequently recommended discontinuation of his PhosLo and had started him on Tums chewable tablets 200 mg 4 tablets three times daily with meals, hopefully this will not be a factor in his onset of his symptoms.  His upper GI was finally completed and it did show mild-to-moderate restrictive evidence of his gastric band, however, there was only mildly delayed passage of contrast into the stomach.  This gastric pouch was noted to be small, the contrast flowing freely through the anastomosis of his prior gastric J without difficulty.  He does have a little bit of gastroesophageal reflux as well.  Therefore, on November 22, 2010, the patient's diet was advanced showing good toleration of his diet and was determined to be stable for discharge home with plans for followup with Dr. Daphine Deutscher as an outpatient.  DISCHARGE DIAGNOSES: 1. Nausea, vomiting, epigastric pain, possibly multifactorial either     secondary to gastric band being restrictive or his PhosLo binding     agents clumping at his anastomosis causing partial obstruction. 2. Obesity. 3.  End-stage renal disease - stable.  DISCHARGE MEDICATIONS:  The patient will be taking Tums 200 mg 4 tablets three times daily now, Protonix 40 mg daily, multivitamin once daily, and Tylenol p.r.n. pain.  He will discontinue use of his PhosLo at present time.  RECOMMENDATIONS:  The patient is advised to chew his foods well or maintain on a soft renal diet.  He was also asked to follow up with Dr. Daphine Deutscher in the coming weeks for any additional adjustments to his band and follow this.     Brayton El, PA-C   ______________________________ Lorne Skeens. Lisbeth Puller, M.D.    KB/MEDQ  D:  11/22/2010  T:   11/22/2010  Job:  [id number]  Electronically Signed by Brayton El  on 11/29/2010 03:21:21 PM Electronically Signed by Glenna Fellows M.D. on 12/11/2010 01:16:15 PM

## 2010-12-13 ENCOUNTER — Telehealth: Payer: Self-pay | Admitting: Cardiovascular Disease

## 2010-12-13 NOTE — Telephone Encounter (Signed)
Stress faxed to [facility] @ [phone]   12/13/10/km

## 2010-12-22 ENCOUNTER — Encounter (INDEPENDENT_AMBULATORY_CARE_PROVIDER_SITE_OTHER): Payer: 59

## 2010-12-22 ENCOUNTER — Encounter (INDEPENDENT_AMBULATORY_CARE_PROVIDER_SITE_OTHER): Payer: 59 | Admitting: Surgery

## 2010-12-29 ENCOUNTER — Encounter (INDEPENDENT_AMBULATORY_CARE_PROVIDER_SITE_OTHER): Payer: 59

## 2011-01-12 ENCOUNTER — Encounter (INDEPENDENT_AMBULATORY_CARE_PROVIDER_SITE_OTHER): Payer: Self-pay | Admitting: Surgery

## 2011-01-12 ENCOUNTER — Ambulatory Visit (INDEPENDENT_AMBULATORY_CARE_PROVIDER_SITE_OTHER): Payer: 59 | Admitting: Surgery

## 2011-01-12 ENCOUNTER — Emergency Department (HOSPITAL_COMMUNITY): Payer: 59

## 2011-01-12 ENCOUNTER — Emergency Department (HOSPITAL_COMMUNITY)
Admission: EM | Admit: 2011-01-12 | Discharge: 2011-01-12 | Disposition: A | Discharge status: Home / Self Care | Payer: 59 | Attending: Emergency Medicine | Admitting: Emergency Medicine

## 2011-01-12 VITALS — BP 182/118 | HR 72 | Temp 97.2°F | Resp 24 | Ht 71.0 in | Wt 261.2 lb

## 2011-01-12 DIAGNOSIS — R0989 Other specified symptoms and signs involving the circulatory and respiratory systems: Secondary | ICD-10-CM | POA: Insufficient documentation

## 2011-01-12 DIAGNOSIS — R1012 Left upper quadrant pain: Secondary | ICD-10-CM | POA: Insufficient documentation

## 2011-01-12 DIAGNOSIS — R197 Diarrhea, unspecified: Secondary | ICD-10-CM | POA: Insufficient documentation

## 2011-01-12 DIAGNOSIS — R112 Nausea with vomiting, unspecified: Secondary | ICD-10-CM | POA: Insufficient documentation

## 2011-01-12 DIAGNOSIS — I120 Hypertensive chronic kidney disease with stage 5 chronic kidney disease or end stage renal disease: Secondary | ICD-10-CM | POA: Insufficient documentation

## 2011-01-12 DIAGNOSIS — R509 Fever, unspecified: Secondary | ICD-10-CM | POA: Insufficient documentation

## 2011-01-12 DIAGNOSIS — Z992 Dependence on renal dialysis: Secondary | ICD-10-CM | POA: Insufficient documentation

## 2011-01-12 DIAGNOSIS — N186 End stage renal disease: Secondary | ICD-10-CM

## 2011-01-12 DIAGNOSIS — R0609 Other forms of dyspnea: Secondary | ICD-10-CM | POA: Insufficient documentation

## 2011-01-12 DIAGNOSIS — K2970 Gastritis, unspecified, without bleeding: Secondary | ICD-10-CM

## 2011-01-12 DIAGNOSIS — R0602 Shortness of breath: Secondary | ICD-10-CM | POA: Insufficient documentation

## 2011-01-12 LAB — COMPREHENSIVE METABOLIC PANEL
AST: 22 U/L (ref 0–37)
Albumin: 4.4 g/dL (ref 3.5–5.2)
BUN: 70 mg/dL — ABNORMAL HIGH (ref 6–23)
Chloride: 93 mEq/L — ABNORMAL LOW (ref 96–112)
Creatinine, Ser: 12.66 mg/dL — ABNORMAL HIGH (ref 0.50–1.35)
Potassium: 5.2 mEq/L — ABNORMAL HIGH (ref 3.5–5.1)
Total Protein: 8.9 g/dL — ABNORMAL HIGH (ref 6.0–8.3)

## 2011-01-12 LAB — CBC
HCT: 40.5 % (ref 39.0–52.0)
MCHC: 33.8 g/dL (ref 30.0–36.0)
MCV: 93.8 fL (ref 78.0–100.0)
Platelets: 185 10*3/uL (ref 150–400)
RDW: 14.1 % (ref 11.5–15.5)
WBC: 10.6 10*3/uL — ABNORMAL HIGH (ref 4.0–10.5)

## 2011-01-12 LAB — DIFFERENTIAL
Basophils Absolute: 0 10*3/uL (ref 0.0–0.1)
Eosinophils Absolute: 0 10*3/uL (ref 0.0–0.7)
Eosinophils Relative: 0 % (ref 0–5)
Lymphocytes Relative: 9 % — ABNORMAL LOW (ref 12–46)
Monocytes Absolute: 0.2 10*3/uL (ref 0.1–1.0)

## 2011-01-12 LAB — LIPASE, BLOOD: Lipase: 26 U/L (ref 11–59)

## 2011-01-12 MED ORDER — SUCRALFATE 1 GM/10ML PO SUSP: 1.0000 g | Freq: Four times a day (QID) | ORAL | Status: DC

## 2011-01-12 MED ORDER — SUCRALFATE 1 GM/10ML PO SUSP: 1.0000 g | Freq: Four times a day (QID) | ORAL | Status: AC

## 2011-01-12 MED ORDER — IOHEXOL 300 MG/ML  SOLN: 100.0000 mL | Freq: Once | INTRAMUSCULAR | Status: DC | PRN

## 2011-01-12 NOTE — Patient Instructions (Signed)
Go have dialysis this afternoon Take Carafate as directed If pain persists, then proceed to [facility] ER

## 2011-01-12 NOTE — Progress Notes (Signed)
Randall [name] comes in with a two-day history of abdominal pain after eating an AAA. He has done well and has lost enough weight to be a candidate for a kidney transplant. He is on that list now. This pain has had some dry heaves with accompanying it. His bowels have been moving as well.  I have taken to cc out of his band to give him a band vacation. That is all I can get out of the band.I am going to treat him for gastritis with Carafate and see him back in a couple weeks. I think that if his pain does not resolve in its likely he will want up behind back at [facility] hospital.Randall [name] procedure was done in September 2011. He is one year out and has lost 64 pounds since his band over bypass. He has been having some intermittent bouts of abdominal pain and these of the [place] was some nausea and retching. Today a one and took out 2 cc from his band. He is supposed to be having dialysis in about an hour. I think we may send him over for dialysis and they can give him some fluid. The meantime him and give him a prescription for Carafate and the likelihood that he has some element of gastritis. If this persists he'll need to go to come hospital for evaluation.

## 2011-01-23 ENCOUNTER — Encounter (INDEPENDENT_AMBULATORY_CARE_PROVIDER_SITE_OTHER): Payer: 59

## 2011-02-22 ENCOUNTER — Encounter (INDEPENDENT_AMBULATORY_CARE_PROVIDER_SITE_OTHER): Payer: 59 | Admitting: Surgery

## 2011-02-22 ENCOUNTER — Encounter (INDEPENDENT_AMBULATORY_CARE_PROVIDER_SITE_OTHER): Payer: 59

## 2011-03-22 ENCOUNTER — Encounter (INDEPENDENT_AMBULATORY_CARE_PROVIDER_SITE_OTHER): Payer: 59

## 2011-03-30 ENCOUNTER — Encounter (INDEPENDENT_AMBULATORY_CARE_PROVIDER_SITE_OTHER): Payer: 59 | Admitting: Surgery

## 2011-05-24 ENCOUNTER — Encounter (INDEPENDENT_AMBULATORY_CARE_PROVIDER_SITE_OTHER): Payer: Self-pay | Admitting: Surgery

## 2011-05-24 ENCOUNTER — Ambulatory Visit (INDEPENDENT_AMBULATORY_CARE_PROVIDER_SITE_OTHER): Payer: 59 | Admitting: Surgery

## 2011-05-24 ENCOUNTER — Telehealth (INDEPENDENT_AMBULATORY_CARE_PROVIDER_SITE_OTHER): Payer: Self-pay | Admitting: Surgery

## 2011-05-24 VITALS — BP 122/80 | HR 68 | Temp 97.4°F | Resp 18 | Ht 71.0 in | Wt 271.4 lb

## 2011-05-24 DIAGNOSIS — Z9884 Bariatric surgery status: Secondary | ICD-10-CM

## 2011-05-24 DIAGNOSIS — Z4651 Encounter for fitting and adjustment of gastric lap band: Secondary | ICD-10-CM

## 2011-05-24 HISTORY — DX: Bariatric surgery status: Z98.84

## 2011-05-24 NOTE — Progress Notes (Signed)
Haskel Khan 46 y.o.  Body mass index is 37.85 kg/(m^2).  Patient Active Problem List  Diagnoses  . ESRD (end stage renal disease) on dialysis    No Known Allergies  Past Surgical History  Procedure Date  . Gastric restriction surgery 12/06/09    lap band   [name]., MD, MD No diagnosis found.  On active transplant list.  Added 1.5 cc to his band today.  Return 2 months.   Matt B. Daphine Deutscher, MD, [facility] [phone] beeper [phone]  05/24/2011 11:08 AM

## 2011-05-24 NOTE — Patient Instructions (Signed)

## 2011-05-25 NOTE — Telephone Encounter (Signed)
Contacted the patient and scheduled him to see Dr Daphine Deutscher  07/06/11

## 2011-06-26 ENCOUNTER — Encounter (HOSPITAL_COMMUNITY): Payer: Self-pay | Admitting: Pharmacy Technician

## 2011-06-26 ENCOUNTER — Other Ambulatory Visit: Payer: Self-pay | Admitting: *Deleted

## 2011-07-04 ENCOUNTER — Other Ambulatory Visit: Payer: Self-pay | Admitting: *Deleted

## 2011-07-04 ENCOUNTER — Encounter: Payer: Self-pay | Admitting: *Deleted

## 2011-07-05 ENCOUNTER — Encounter (HOSPITAL_COMMUNITY)
Admission: RE | Disposition: A | Discharge status: Home / Self Care | Payer: Self-pay | Source: Ambulatory Visit | Attending: Vascular Surgery

## 2011-07-05 ENCOUNTER — Telehealth: Payer: Self-pay | Admitting: Vascular Surgery

## 2011-07-05 ENCOUNTER — Ambulatory Visit (HOSPITAL_COMMUNITY)
Admission: RE | Admit: 2011-07-05 | Discharge: 2011-07-05 | Disposition: A | Discharge status: Home / Self Care | Payer: 59 | Source: Ambulatory Visit | Attending: Vascular Surgery | Admitting: Vascular Surgery

## 2011-07-05 DIAGNOSIS — N189 Chronic kidney disease, unspecified: Secondary | ICD-10-CM | POA: Insufficient documentation

## 2011-07-05 DIAGNOSIS — T82598A Other mechanical complication of other cardiac and vascular devices and implants, initial encounter: Secondary | ICD-10-CM | POA: Insufficient documentation

## 2011-07-05 DIAGNOSIS — Y832 Surgical operation with anastomosis, bypass or graft as the cause of abnormal reaction of the patient, or of later complication, without mention of misadventure at the time of the procedure: Secondary | ICD-10-CM | POA: Insufficient documentation

## 2011-07-05 DIAGNOSIS — T82898A Other specified complication of vascular prosthetic devices, implants and grafts, initial encounter: Secondary | ICD-10-CM

## 2011-07-05 HISTORY — PX: SHUNTOGRAM: SHX5491

## 2011-07-05 LAB — POCT I-STAT, CHEM 8
Calcium, Ion: 0.91 mmol/L — ABNORMAL LOW (ref 1.12–1.32)
Creatinine, Ser: 8.2 mg/dL — ABNORMAL HIGH (ref 0.50–1.35)
Glucose, Bld: 84 mg/dL (ref 70–99)
HCT: 39 % (ref 39.0–52.0)
Hemoglobin: 13.3 g/dL (ref 13.0–17.0)

## 2011-07-05 SURGERY — ASSESSMENT, SHUNT FUNCTION, WITH CONTRAST RADIOGRAPHIC STUDY
Anesthesia: LOCAL

## 2011-07-05 MED ORDER — LIDOCAINE HCL (PF) 1 % IJ SOLN
INTRAMUSCULAR | Status: AC
  Filled 2011-07-05: qty 30

## 2011-07-05 MED ORDER — SODIUM CHLORIDE 0.9 % IJ SOLN: 3.0000 mL | INTRAMUSCULAR | Status: DC | PRN

## 2011-07-05 MED ORDER — HEPARIN (PORCINE) IN NACL 2-0.9 UNIT/ML-% IJ SOLN
INTRAMUSCULAR | Status: AC
  Filled 2011-07-05: qty 1000

## 2011-07-05 NOTE — Telephone Encounter (Signed)
Message copied by Fredrich Birks on Thu Jul 05, 2011 11:40 AM ------      Message from: Melene Plan      Created: Thu Jul 05, 2011 10:57 AM                   ----- Message -----         From: Fransisco Hertz, MD         Sent: 07/05/2011   9:54 AM           To: Almetta Lovely, RN            Randall [name]      [id number]      Jul 22, 1965            Procedure:       1. L RC AVF cann. W/ ultrasound      2.  L arm fistulogram            Follow-up: 4-6 wk w/ Dr. Darrick Penna

## 2011-07-05 NOTE — Telephone Encounter (Signed)
Patient notified of appointment via telephone call. Mailed letter to home address also regarding scheduled appointment.   

## 2011-07-05 NOTE — Op Note (Signed)
OPERATIVE NOTE   PROCEDURE: 1. left radiocephalic arteriovenous fistula cannulation under ultrasound guidance 2. left arm fistulogram  PRE-OPERATIVE DIAGNOSIS: Malfunctioning left arteriovenous fistula  POST-OPERATIVE DIAGNOSIS: same as above   SURGEON: Leonides Sake, MD  ANESTHESIA: local  ESTIMATED BLOOD LOSS: 5 cc  FINDING(S): 1. Widely patent fistula with pseudoaneurysmal degeneration near arterial anastomosis 2. Multiple branches off cephalic vein 3. Widely patent venous outflow 4. Basilic/brachial vein larger than upper arm cephalic vein  SPECIMEN(S):  None  CONTRAST: 50 cc  INDICATIONS: Randall [name] is a 46 y.o. male who  presents with malfunctioning left radiocephalic arteriovenous fistula.  The patient is scheduled for left arm fistulogram.  The patient is aware the risks include but are not limited to: bleeding, infection, thrombosis of the cannulated access, and possible anaphylactic reaction to the contrast.  The patient is aware of the risks of the procedure and elects to proceed forward.  DESCRIPTION: After full informed written consent was obtained, the patient was brought back to the angiography suite and placed supine upon the angiography table.  The patient was connected to monitoring equipment.  The left forearm was prepped and draped in the standard fashion for a left arm fistulogram.  Under ultrasound guidance, the left arm arteriovenous fistula was cannulated with a micropuncture needle.  The microwire was advanced into the fistula and the needle was exchanged for the a microsheath, which was lodged 2 cm into the access.  The wire was removed and the sheath was connected to the IV extension tubing.  Hand injections were completed to image the access from the antecubitum up to the level of axilla.  The central venous structures were also imaged by hand injections.  Based on the images, this patient will need: possible future ligation of radiocephalic fistula and  placement of brachiocephalic or basilic vein transposition.  Given the patient is already on the renal transplant list, I would continue use of radiocephalic arteriovenous fistula as long as possible.  A 4-0 Monocryl purse-string suture was sewn around the sheath.  The sheath was removed while tying down the suture.  A sterile bandage was applied to the puncture site.  COMPLICATIONS: none  CONDITION: stable  Leonides Sake, MD [facility] [place] Office: [phone] Pager: [phone]  07/05/2011 9:45 AM

## 2011-07-05 NOTE — H&P (Signed)
  [facility] [place]  Brief Access History and Physical  History of Present Illness  Randall [name] is a 46 y.o. male who presents with chief complaint: malfunctioning L arm access.  The patient presents today for L arm fistulogram, possible intervention.    Past Medical History  Diagnosis Date  . Chronic kidney disease     kidney failure  . Nausea   . Vomiting   . Stomach cramps   . Fever and chills     Past Surgical History  Procedure Date  . Gastric restriction surgery 12/06/09    lap band    History   Social History  . Marital Status: Married    Spouse Name: N/A    Number of Children: N/A  . Years of Education: N/A   Occupational History  . Not on file.   Social History Main Topics  . Smoking status: Never Smoker   . Smokeless tobacco: Never Used  . Alcohol Use: No  . Drug Use: No  . Sexually Active: Not on file   Other Topics Concern  . Not on file   Social History Narrative  . No narrative on file    No family history on file.  No current facility-administered medications on file prior to encounter.   Current Outpatient Prescriptions on File Prior to Encounter  Medication Sig Dispense Refill  . ezetimibe (ZETIA) 10 MG tablet Take 10 mg by mouth daily.      Marland Kitchen LEVOTHYROXINE SODIUM PO Take 1 tablet by mouth daily.        No Known Allergies  Review of Systems: Kidney Disease, As listed above, otherwise negative.  Physical Examination  Filed Vitals:   07/05/11 0737 07/05/11 0755  BP:  143/94  Pulse: 83   Temp: 97 F (36.1 C)   TempSrc: Oral   Resp: 20   Height: 5\' 11"  (1.803 m) 5\' 11"  (1.803 m)  Weight: 252 lb (114.306 kg) 252 lb (114.306 kg)  SpO2: 98%    Body mass index is 35.15 kg/(m^2).  General: A&O x 3, WDWN  Pulmonary: Sym exp, good air movt, CTAB, no rales, rhonchi, & wheezing  Cardiac: RRR, Nl S1, S2, no Murmurs, rubs or gallops  Gastrointestinal: soft, NTND, -G/R, - HSM, - masses, - CVAT  B  Musculoskeletal: M/S 5/5 throughout , Extremities without ischemic changes , L RC AVF with thrill and bruit  Laboratory See iStat  Medical Decision Making  Randall [name] is a 46 y.o. male who presents with: malfunctioning L arm access .   The patient is scheduled for: L arm fistulogram, possible intervention. I discussed with the patient the nature of angiographic procedures, especially the limited patencies of any endovascular intervention.  The patient is aware of that the risks of an angiographic procedure include but are not limited to: bleeding, infection, access site complications, renal failure, embolization, rupture of vessel, dissection, possible need for emergent surgical intervention, possible need for surgical procedures to treat the patient's pathology, and stroke and death.    The patient is aware of the risks and agrees to proceed.  Leonides Sake, MD [facility] [place] Office: [phone] Pager: [phone]  07/05/2011, 7:56 AM

## 2011-07-05 NOTE — Telephone Encounter (Signed)
Message copied by Fredrich Birks on Thu Jul 05, 2011 11:36 AM ------      Message from: Melene Plan      Created: Thu Jul 05, 2011 10:57 AM                   ----- Message -----         From: Fransisco Hertz, MD         Sent: 07/05/2011   9:54 AM           To: Almetta Lovely, RN            GALE HULSE      [id number]      January 28, 1966            Procedure:       1. L RC AVF cann. W/ ultrasound      2.  L arm fistulogram            Follow-up: 4-6 wk w/ Dr. Darrick Penna

## 2011-07-06 ENCOUNTER — Encounter (INDEPENDENT_AMBULATORY_CARE_PROVIDER_SITE_OTHER): Payer: Self-pay | Admitting: Surgery

## 2011-07-06 ENCOUNTER — Ambulatory Visit (INDEPENDENT_AMBULATORY_CARE_PROVIDER_SITE_OTHER): Payer: 59 | Admitting: Surgery

## 2011-07-06 VITALS — BP 112/78 | HR 90 | Temp 99.8°F | Resp 20 | Ht 71.0 in | Wt 267.0 lb

## 2011-07-06 DIAGNOSIS — Z4651 Encounter for fitting and adjustment of gastric lap band: Secondary | ICD-10-CM

## 2011-07-06 DIAGNOSIS — Z9884 Bariatric surgery status: Secondary | ICD-10-CM

## 2011-07-06 NOTE — Patient Instructions (Signed)

## 2011-07-06 NOTE — Progress Notes (Signed)
Randall [name] Body mass index is 37.24 kg/(m^2).  Having regurgitation:  no  Nocturnal reflux?  no  Amount of fill  +1 Awaiting kidney transplant.  Will follow every 6 weeks until transplant.

## 2011-08-01 ENCOUNTER — Encounter: Payer: Self-pay | Admitting: Vascular Surgery

## 2011-08-02 ENCOUNTER — Ambulatory Visit (INDEPENDENT_AMBULATORY_CARE_PROVIDER_SITE_OTHER): Payer: 59 | Admitting: Vascular Surgery

## 2011-08-02 ENCOUNTER — Encounter: Payer: Self-pay | Admitting: Vascular Surgery

## 2011-08-02 VITALS — BP 116/75 | HR 86 | Resp 20 | Ht 71.0 in | Wt 266.0 lb

## 2011-08-02 DIAGNOSIS — N186 End stage renal disease: Secondary | ICD-10-CM

## 2011-08-02 NOTE — Progress Notes (Signed)
[facility] [place]  Subjective  - F/U s/p fistulogram.  Patient reports no problems with dialysis.  No signs of steal, numbness,pain, or tingling.    Objective 116/75 86   20  Left arm fistulogram 07-05-2011 by Dr. Imogene Burn was reviewed with Dr. Purcell Nails):  1. Widely patent fistula with pseudoaneurysmal degeneration near arterial anastomosis 2. Multiple branches off cephalic vein 3. Widely patent venous outflow 4. Basilic/brachial vein larger than upper arm cephalic vein PE: Bilateral upper extremities V/V/M intact. Palpable fistula depth and circumference within 6mm rule. Good thrill felt at site of fistula    Assessment/Planning: Working dialysis fistula left forearm. No signs of steal Pending kidney transplant at [facility]. Follow up PRN.    [facility] 08/02/2011 1:58 PM -- Fistulogram reviewed. The patient's fistula is patent.  Currently dialyzing without difficulty.  There is really no way to revise the fistula.  If he continues to have problems will need new access hopefully will get transplant before it fails.  Fabienne Bruns, MD [facility] [place] Office: [phone] Pager: [phone]

## 2011-08-31 ENCOUNTER — Encounter (INDEPENDENT_AMBULATORY_CARE_PROVIDER_SITE_OTHER): Payer: 59 | Admitting: Surgery

## 2011-09-07 ENCOUNTER — Ambulatory Visit (INDEPENDENT_AMBULATORY_CARE_PROVIDER_SITE_OTHER): Payer: 59 | Admitting: Surgery

## 2011-09-07 ENCOUNTER — Encounter (INDEPENDENT_AMBULATORY_CARE_PROVIDER_SITE_OTHER): Payer: Self-pay | Admitting: Surgery

## 2011-09-07 VITALS — BP 70/44 | Ht 71.0 in | Wt 266.0 lb

## 2011-09-07 DIAGNOSIS — Z9884 Bariatric surgery status: Secondary | ICD-10-CM

## 2011-09-07 DIAGNOSIS — Z4651 Encounter for fitting and adjustment of gastric lap band: Secondary | ICD-10-CM

## 2011-09-07 NOTE — Patient Instructions (Signed)
1. Stay on liquids for the next 2 days as you adapt to your new fill volume.  Then resume your previous diet. 2. Decreasing your carbohydrate intake will hasten you weight loss.  Rely more on proteins for your meals.  Avoid condiments that contain sweets such as Honey Mustard and sugary salad dressings.   3. Stay in the "green zone".  If you are regurgitating with meals, having night time reflux, and find yourself eating soft comfort foods (mashed potatoes, potato chips)...realize that you are developing "maladaptive eating".  You will not lose weight this way and may regain weight.  The GREEN ZONE is eating smaller portions and not regurgitating.  Hence we may need to withdraw fluid from your band. 4. Build exercise into your daily routine.  Walking is the best way to start but do something every day if you can.    Thanks for your patience.  If you need further assistance after leaving the office, please call our office and speak with French Ana A.  [phone].  If you want to leave a message for Dr. Daphine Deutscher, please call his office phone at [phone].

## 2011-09-07 NOTE — Progress Notes (Signed)
Randall [name] 46 y.o.  Body mass index is 37.10 kg/(m^2).  Patient Active Problem List  Diagnosis  . ESRD (end stage renal disease) on dialysis  . 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 End stage renal disease    No Known Allergies  Past Surgical History  Procedure Date  . Gastric restriction surgery 12/06/09    lap band  . 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  He had dialysis this morning.  He really wanted to fill today contributing to his way down even more. His weight today is 266. I added 1 cc to his APL over bypass.  I don't think peritoneal dialysis is a good idea with a lap band in place. I would try to get his kidney first and then work on dealing with his antirejection drugs.  Plan to see him again in 2 months. Randall B. Randall Deutscher, MD, [facility] [phone] beeper [phone]  09/07/2011 2:52 PM

## 2011-09-27 ENCOUNTER — Encounter (INDEPENDENT_AMBULATORY_CARE_PROVIDER_SITE_OTHER): Payer: 59 | Admitting: General Surgery

## 2011-10-06 ENCOUNTER — Emergency Department (HOSPITAL_COMMUNITY): Payer: 59

## 2011-10-06 ENCOUNTER — Emergency Department (HOSPITAL_COMMUNITY)
Admission: EM | Admit: 2011-10-06 | Discharge: 2011-10-07 | Disposition: A | Discharge status: Home / Self Care | Payer: 59 | Attending: Emergency Medicine | Admitting: Emergency Medicine

## 2011-10-06 ENCOUNTER — Encounter (HOSPITAL_COMMUNITY): Payer: Self-pay | Admitting: *Deleted

## 2011-10-06 DIAGNOSIS — N189 Chronic kidney disease, unspecified: Secondary | ICD-10-CM | POA: Insufficient documentation

## 2011-10-06 DIAGNOSIS — S92403A Displaced unspecified fracture of unspecified great toe, initial encounter for closed fracture: Secondary | ICD-10-CM

## 2011-10-06 DIAGNOSIS — E119 Type 2 diabetes mellitus without complications: Secondary | ICD-10-CM | POA: Insufficient documentation

## 2011-10-06 DIAGNOSIS — W208XXA Other cause of strike by thrown, projected or falling object, initial encounter: Secondary | ICD-10-CM | POA: Insufficient documentation

## 2011-10-06 DIAGNOSIS — S91209A Unspecified open wound of unspecified toe(s) with damage to nail, initial encounter: Secondary | ICD-10-CM

## 2011-10-06 DIAGNOSIS — S92919A Unspecified fracture of unspecified toe(s), initial encounter for closed fracture: Secondary | ICD-10-CM | POA: Insufficient documentation

## 2011-10-06 NOTE — ED Notes (Signed)
Pt states that he was moving furniture and the dolly that had a table on it fell on his right great tow. Pt states that the nail is off and that he bandaged the foot and it bleed threw. Pt states DM and renal pt and wanted to get checked out. Pt ambulatory.

## 2011-10-07 MED ORDER — CLINDAMYCIN HCL 300 MG PO CAPS: 300.0000 mg | ORAL_CAPSULE | Freq: Four times a day (QID) | ORAL | Status: AC

## 2011-10-07 MED ORDER — HYDROCODONE-ACETAMINOPHEN 5-325 MG PO TABS: 1.0000 | ORAL_TABLET | Freq: Four times a day (QID) | ORAL | Status: AC | PRN

## 2011-10-07 MED ORDER — TETANUS-DIPHTH-ACELL PERTUSSIS 5-2.5-18.5 LF-MCG/0.5 IM SUSP
INTRAMUSCULAR | Status: AC
  Administered 2011-10-07: 0.5 mL via INTRAMUSCULAR
  Filled 2011-10-07: qty 0.5

## 2011-10-07 MED ORDER — HYDROMORPHONE HCL PF 2 MG/ML IJ SOLN
1.0000 mg | Freq: Once | INTRAMUSCULAR | Status: AC
  Administered 2011-10-07: 1 mg via INTRAMUSCULAR
  Filled 2011-10-07: qty 1

## 2011-10-07 MED ORDER — TETANUS-DIPHTH-ACELL PERTUSSIS 5-2.5-18.5 LF-MCG/0.5 IM SUSP
0.5000 mL | Freq: Once | INTRAMUSCULAR | Status: AC
  Administered 2011-10-07: 0.5 mL via INTRAMUSCULAR

## 2011-10-07 NOTE — ED Notes (Signed)
Called ortho for crutches; ortho on way to speak with PA.

## 2011-10-07 NOTE — ED Provider Notes (Signed)
History     CSN: [id number]  Arrival date & time 10/06/11  2200   First MD Initiated Contact with Patient 10/06/11 2207      22:39 PM HPI Patient reports a large object fell onto his right great toe just prior to arrival. Reports nail is partially avulsed reports throbbing pain. Denies numbness, tingling.  Patient is a 46 y.o. male presenting with foot injury.  Foot Injury  The incident occurred 1 to 2 hours ago. The incident occurred at work. Injury mechanism: crush. Pain location: right great toe. The quality of the pain is described as throbbing. The pain is moderate. The pain has been constant since onset. Pertinent negatives include no numbness, no inability to bear weight, no loss of motion, no muscle weakness, no loss of sensation and no tingling. He reports no foreign bodies present. Nothing aggravates the symptoms.    Past Medical History  Diagnosis Date  . Chronic kidney disease     kidney failure  . Nausea   . Vomiting   . Stomach cramps   . Fever and chills   . Diabetes mellitus     Past Surgical History  Procedure Date  . Gastric restriction surgery 12/06/09    lap band  . Dialysis fistula creation 2011    Family History  Problem Relation Age of Onset  . Hypertension Mother   . Diabetes Mother   . Hypertension Father   . Diabetes Father     History  Substance Use Topics  . Smoking status: Never Smoker   . Smokeless tobacco: Never Used  . Alcohol Use: No      Review of Systems  Musculoskeletal:       Toe injury  Neurological: Negative for tingling and numbness.  All other systems reviewed and are negative.    Allergies  Review of patient's allergies indicates no known allergies.  Home Medications   Current Outpatient Rx  Name Route Sig Dispense Refill  . ACETAMINOPHEN 500 MG PO TABS Oral Take 1,000 mg by mouth every 6 (six) hours as needed. For pain    . CALCIUM ACETATE 667 MG PO CAPS Oral Take 1,334 mg by mouth 3 (three) times daily with  meals.    Marland Kitchen CINACALCET HCL 60 MG PO TABS Oral Take 60 mg by mouth at bedtime.     Marland Kitchen EZETIMIBE 10 MG PO TABS Oral Take 10 mg by mouth at bedtime.     Marland Kitchen FOSRENOL 1000 MG PO CHEW Oral Chew 2,000-3,000 mg by mouth 3 (three) times daily with meals.     Marland Kitchen LEVOTHYROXINE SODIUM PO Oral Take 1 tablet by mouth at bedtime. Doesn't remember strength    . NAPROXEN SODIUM 220 MG PO TABS Oral Take 440 mg by mouth once.      BP 127/83  Temp 98.5 F (36.9 C) (Oral)  Resp 20  SpO2 97%  Physical Exam  Constitutional: He is oriented to person, place, and time. He appears well-developed and well-nourished.  HENT:  Head: Normocephalic and atraumatic.  Eyes: Pupils are equal, round, and reactive to light.  Musculoskeletal:       Right great toe: Partially avulsed toenail. Swelling. Tenderness to palpation. Hemostatic  Neurological: He is alert and oriented to person, place, and time.  Skin: Skin is warm and dry. No rash noted. No erythema. No pallor.  Psychiatric: He has a normal mood and affect. His behavior is normal.    ED Course  Procedures   Dg Foot Complete Right  10/06/2011  *RADIOLOGY REPORT*  Clinical Data: Right foot pain status post trauma.  RIGHT FOOT COMPLETE - 3+ VIEW  Comparison: None.  Findings: Comminuted fracture of the distal phalanx first digit. Overlying bandage obscures detailed evaluation however the fracture does appear extend into the interphalangeal joint.  Moderate degenerative changes of the first metatarsal phalangeal joint.  No dislocation.  Calcaneal enthesopathic changes. Loss of Boehler angle.  Moderate midfoot DJD.  IMPRESSION: Comminuted fracture of the distal phalanx first digit with interphalangeal joint extension.  Loss of Boehler angle of the calcaneus may be sequelae of prior trauma. Correlate clinically if concerned for acute calcaneal injury.  Original Report Authenticated By: Waneta Martins, M.D.     MDM   12:10 AM  spoke with Dr. Lestine Box. Recommends a  hard sole shoe and followup in office on Monday. States since laceration is underneath the nail and nailbed is not considered an open fracture. However I will place patient on clindamycin as prophylaxis.   Thomasene Lot, PA-C 10/07/11 [phone]

## 2011-10-07 NOTE — ED Notes (Signed)
Pt. Instructed to complete full dose on antibiotics. Pt. Instructed not to take acetaminophen in combination with Vicodin. Pt instructed he may take ibuprofen or Advil. Pt. Instructed to elevate right foot as much as possible.  Verbalized understanding of d/c teaching. Respirations even and unlabored. Vitals stable. NAD. Pt. D/c with boot in place and crutches in hand. A.O. X 4.

## 2011-10-07 NOTE — ED Provider Notes (Signed)
Medical screening examination/treatment/procedure(s) were performed by non-physician practitioner and as supervising physician I was immediately available for consultation/collaboration.  Olivia Mackie, MD 10/07/11 [phone]

## 2011-10-07 NOTE — ED Notes (Signed)
Post op boot applied and crutches given to patient by ortho tech.

## 2011-10-07 NOTE — ED Notes (Signed)
Ortho and PA at bedside 

## 2011-11-12 ENCOUNTER — Emergency Department (HOSPITAL_COMMUNITY)
Admission: EM | Admit: 2011-11-12 | Discharge: 2011-11-12 | Disposition: A | Discharge status: Home / Self Care | Payer: 59 | Attending: Emergency Medicine | Admitting: Emergency Medicine

## 2011-11-12 DIAGNOSIS — E119 Type 2 diabetes mellitus without complications: Secondary | ICD-10-CM | POA: Insufficient documentation

## 2011-11-12 DIAGNOSIS — N186 End stage renal disease: Secondary | ICD-10-CM | POA: Insufficient documentation

## 2011-11-12 DIAGNOSIS — T82898A Other specified complication of vascular prosthetic devices, implants and grafts, initial encounter: Secondary | ICD-10-CM | POA: Insufficient documentation

## 2011-11-12 DIAGNOSIS — Y849 Medical procedure, unspecified as the cause of abnormal reaction of the patient, or of later complication, without mention of misadventure at the time of the procedure: Secondary | ICD-10-CM | POA: Insufficient documentation

## 2011-11-12 DIAGNOSIS — Z9884 Bariatric surgery status: Secondary | ICD-10-CM | POA: Insufficient documentation

## 2011-11-12 LAB — POCT I-STAT, CHEM 8
BUN: 99 mg/dL — ABNORMAL HIGH (ref 6–23)
Calcium, Ion: 1 mmol/L — ABNORMAL LOW (ref 1.12–1.23)
Chloride: 102 meq/L (ref 96–112)
Creatinine, Ser: 18.1 mg/dL — ABNORMAL HIGH (ref 0.50–1.35)
Glucose, Bld: 127 mg/dL — ABNORMAL HIGH (ref 70–99)
HCT: 38 % — ABNORMAL LOW (ref 39.0–52.0)
Hemoglobin: 12.9 g/dL — ABNORMAL LOW (ref 13.0–17.0)
Potassium: 4.8 meq/L (ref 3.5–5.1)
Sodium: 137 meq/L (ref 135–145)
TCO2: 21 mmol/L (ref 0–100)

## 2011-11-12 NOTE — ED Notes (Addendum)
To ED for for eval after going for dialysis treatment today. Told, after 'they infiltrated my arm' twice, that he has a clot and needed to come to ED. States last dialysis was on Friday. Skin w/d, resp e/u. Appears in NAD. Left arm cap refill WNL.

## 2011-11-12 NOTE — ED Notes (Signed)
Patient with left arm pain from dialysis this am, patient states he could not go through with treatment due to his a/v fistula clogged, patient states he would like to get dialysis due to he not having treatment x 4 days.  Patient in NAD at this time.

## 2011-11-12 NOTE — ED Provider Notes (Signed)
History     CSN: [id number]  Arrival date & time 11/12/11  1105   First MD Initiated Contact with Patient 11/12/11 [phone]      Chief Complaint  Patient presents with  . Arm Pain    (Consider location/radiation/quality/duration/timing/severity/associated sxs/prior treatment) HPI Patient presents after hemodialysis nurse was unable to access his fistula after 2 attempts today. He is asymptomatic other than soreness at left forearm at site of fistula. Patient reports he's been without hemodialysis for 3 days. Denies shortness of breath. Reports that he is above his normal weight, but otherwise asymptomatic. Past Medical History  Diagnosis Date  . Chronic kidney disease     kidney failure  . Nausea   . Vomiting   . Stomach cramps   . Fever and chills   . Diabetes mellitus     Past Surgical History  Procedure Date  . Gastric restriction surgery 12/06/09    lap band  . Dialysis fistula creation 2011    Family History  Problem Relation Age of Onset  . Hypertension Mother   . Diabetes Mother   . Hypertension Father   . Diabetes Father     History  Substance Use Topics  . Smoking status: Never Smoker   . Smokeless tobacco: Never Used  . Alcohol Use: No      Review of Systems  Constitutional: Positive for unexpected weight change.  HENT: Negative.   Respiratory: Negative.   Cardiovascular: Negative.        Pain at left forearm at site of dialysis fistula  Gastrointestinal: Negative.   Musculoskeletal: Negative.   Skin: Negative.   Neurological: Negative.   Hematological: Negative.   Psychiatric/Behavioral: Negative.   All other systems reviewed and are negative.    Allergies  Review of patient's allergies indicates no known allergies.  Home Medications   Current Outpatient Rx  Name Route Sig Dispense Refill  . ACETAMINOPHEN 500 MG PO TABS Oral Take 1,000 mg by mouth every 6 (six) hours as needed. For pain    . CALCIUM ACETATE 667 MG PO CAPS Oral Take  1,334 mg by mouth 3 (three) times daily with meals.    Marland Kitchen CINACALCET HCL 60 MG PO TABS Oral Take 60 mg by mouth at bedtime.     Marland Kitchen EZETIMIBE 10 MG PO TABS Oral Take 10 mg by mouth at bedtime.     Marland Kitchen FOSRENOL 1000 MG PO CHEW Oral Chew 3,000 mg by mouth 3 (three) times daily with meals. 1 tablet with snacks      BP 145/78  Pulse 102  Temp 97.8 F (36.6 C) (Oral)  Resp 18  SpO2 100%  Physical Exam  Nursing note and vitals reviewed. Constitutional: He appears well-developed and well-nourished.  HENT:  Head: Normocephalic and atraumatic.  Eyes: Conjunctivae are normal. Pupils are equal, round, and reactive to light.  Neck: Neck supple. No tracheal deviation present. No thyromegaly present.  Cardiovascular: Normal rate and regular rhythm.   No murmur heard. Pulmonary/Chest: Effort normal and breath sounds normal.  Abdominal: Soft. Bowel sounds are normal. He exhibits no distension. There is no tenderness.  Musculoskeletal: Normal range of motion. He exhibits no edema and no tenderness.       Left forearm with dialysis fistula with good thrill distally, ProMod felt proximally. Proximal aspect of fistula mildly tender, no redness  Neurological: He is alert. Coordination normal.  Skin: Skin is warm and dry. No rash noted.  Psychiatric: He has a normal mood and affect.  ED Course  Procedures (including critical care time)  Labs Reviewed - No data to display No results found.   No diagnosis found.    MDM  Patient is are emergent dialysis as he has normal potassium no dyspnea and is asymptomatic. Spoke with Dr. Caryn Section plan patient is to call kidney Center tomorrow to arrange for dialysis following his fistula being declotted. It has been arranged for interventional radiology to declot his fistula tomorrow Diagnosis #1 dialysis fistula malfunction #2 ESRD      Doug Sou, MD 11/12/11 1743

## 2011-11-13 ENCOUNTER — Encounter (HOSPITAL_COMMUNITY): Payer: Self-pay

## 2011-11-13 ENCOUNTER — Other Ambulatory Visit (HOSPITAL_COMMUNITY): Payer: Self-pay | Admitting: Nephrology

## 2011-11-13 ENCOUNTER — Encounter (HOSPITAL_COMMUNITY): Payer: Self-pay | Admitting: Anesthesiology

## 2011-11-13 ENCOUNTER — Ambulatory Visit (HOSPITAL_COMMUNITY)
Admission: RE | Admit: 2011-11-13 | Discharge: 2011-11-13 | Disposition: A | Discharge status: Home / Self Care | Payer: 59 | Source: Ambulatory Visit | Attending: Nephrology | Admitting: Nephrology

## 2011-11-13 ENCOUNTER — Ambulatory Visit (HOSPITAL_COMMUNITY): Payer: 59

## 2011-11-13 ENCOUNTER — Ambulatory Visit (HOSPITAL_COMMUNITY): Payer: 59 | Admitting: Anesthesiology

## 2011-11-13 ENCOUNTER — Encounter (HOSPITAL_COMMUNITY)
Admission: RE | Disposition: A | Discharge status: Home / Self Care | Payer: Self-pay | Source: Ambulatory Visit | Attending: Vascular Surgery

## 2011-11-13 ENCOUNTER — Ambulatory Visit (HOSPITAL_COMMUNITY)
Admission: RE | Admit: 2011-11-13 | Discharge: 2011-11-13 | Disposition: A | Discharge status: Home / Self Care | Payer: 59 | Source: Ambulatory Visit | Attending: Vascular Surgery | Admitting: Vascular Surgery

## 2011-11-13 DIAGNOSIS — N186 End stage renal disease: Secondary | ICD-10-CM

## 2011-11-13 DIAGNOSIS — I129 Hypertensive chronic kidney disease with stage 1 through stage 4 chronic kidney disease, or unspecified chronic kidney disease: Secondary | ICD-10-CM | POA: Insufficient documentation

## 2011-11-13 DIAGNOSIS — E119 Type 2 diabetes mellitus without complications: Secondary | ICD-10-CM | POA: Insufficient documentation

## 2011-11-13 DIAGNOSIS — Z992 Dependence on renal dialysis: Secondary | ICD-10-CM

## 2011-11-13 DIAGNOSIS — N189 Chronic kidney disease, unspecified: Secondary | ICD-10-CM | POA: Insufficient documentation

## 2011-11-13 DIAGNOSIS — T82898A Other specified complication of vascular prosthetic devices, implants and grafts, initial encounter: Secondary | ICD-10-CM | POA: Insufficient documentation

## 2011-11-13 DIAGNOSIS — Y849 Medical procedure, unspecified as the cause of abnormal reaction of the patient, or of later complication, without mention of misadventure at the time of the procedure: Secondary | ICD-10-CM | POA: Insufficient documentation

## 2011-11-13 HISTORY — PX: INSERTION OF DIALYSIS CATHETER: SHX1324

## 2011-11-13 LAB — GLUCOSE, CAPILLARY
Glucose-Capillary: 61 mg/dL — ABNORMAL LOW (ref 70–99)
Glucose-Capillary: 68 mg/dL — ABNORMAL LOW (ref 70–99)

## 2011-11-13 LAB — POCT I-STAT 4, (NA,K, GLUC, HGB,HCT)
Glucose, Bld: 90 mg/dL (ref 70–99)
HCT: 34 % — ABNORMAL LOW (ref 39.0–52.0)

## 2011-11-13 SURGERY — INSERTION OF DIALYSIS CATHETER
Anesthesia: General | Site: Neck | Laterality: Right | Wound class: Clean

## 2011-11-13 MED ORDER — HEPARIN SODIUM (PORCINE) 1000 UNIT/ML IJ SOLN
INTRAMUSCULAR | Status: DC | PRN
  Administered 2011-11-13: 4.6 mL

## 2011-11-13 MED ORDER — FENTANYL CITRATE 0.05 MG/ML IJ SOLN: 50.0000 ug | INTRAMUSCULAR | Status: DC | PRN

## 2011-11-13 MED ORDER — OXYCODONE-ACETAMINOPHEN 7.5-325 MG PO TABS: 1.0000 | ORAL_TABLET | ORAL | Status: AC | PRN

## 2011-11-13 MED ORDER — PROMETHAZINE HCL 25 MG/ML IJ SOLN: 6.2500 mg | INTRAMUSCULAR | Status: DC | PRN

## 2011-11-13 MED ORDER — MIDAZOLAM HCL 5 MG/5ML IJ SOLN
INTRAMUSCULAR | Status: DC | PRN
  Administered 2011-11-13: 2 mg via INTRAVENOUS

## 2011-11-13 MED ORDER — PHENYLEPHRINE HCL 10 MG/ML IJ SOLN
INTRAMUSCULAR | Status: DC | PRN
  Administered 2011-11-13: 120 ug via INTRAVENOUS

## 2011-11-13 MED ORDER — HYDROMORPHONE HCL PF 1 MG/ML IJ SOLN
INTRAMUSCULAR | Status: AC
  Filled 2011-11-13: qty 1

## 2011-11-13 MED ORDER — LIDOCAINE HCL (CARDIAC) 20 MG/ML IV SOLN
INTRAVENOUS | Status: DC | PRN
  Administered 2011-11-13: 100 mg via INTRAVENOUS

## 2011-11-13 MED ORDER — SODIUM CHLORIDE 0.9 % IV SOLN
INTRAVENOUS | Status: DC
  Administered 2011-11-13: 13:00:00 via INTRAVENOUS

## 2011-11-13 MED ORDER — FENTANYL CITRATE 0.05 MG/ML IJ SOLN
INTRAMUSCULAR | Status: DC | PRN
  Administered 2011-11-13: 100 ug via INTRAVENOUS
  Administered 2011-11-13: 75 ug via INTRAVENOUS
  Administered 2011-11-13: 25 ug via INTRAVENOUS

## 2011-11-13 MED ORDER — HYDROMORPHONE HCL PF 1 MG/ML IJ SOLN
0.2500 mg | INTRAMUSCULAR | Status: DC | PRN
  Administered 2011-11-13 (×4): 0.5 mg via INTRAVENOUS

## 2011-11-13 MED ORDER — SODIUM CHLORIDE 0.9 % IR SOLN
Status: DC | PRN
  Administered 2011-11-13: 14:00:00

## 2011-11-13 MED ORDER — CEFAZOLIN SODIUM-DEXTROSE 2-3 GM-% IV SOLR
INTRAVENOUS | Status: DC | PRN
  Administered 2011-11-13: 2 g via INTRAVENOUS

## 2011-11-13 MED ORDER — IOHEXOL 300 MG/ML  SOLN
100.0000 mL | Freq: Once | INTRAMUSCULAR | Status: AC | PRN
  Administered 2011-11-13: 30 mL via INTRAVENOUS

## 2011-11-13 MED ORDER — ALTEPLASE 2 MG IJ SOLR
2.0000 mg | Freq: Once | INTRAMUSCULAR | Status: DC
  Filled 2011-11-13: qty 2

## 2011-11-13 MED ORDER — SODIUM CHLORIDE 0.9 % IV SOLN
INTRAVENOUS | Status: DC | PRN
  Administered 2011-11-13: 13:00:00 via INTRAVENOUS

## 2011-11-13 MED ORDER — ONDANSETRON HCL 4 MG/2ML IJ SOLN
INTRAMUSCULAR | Status: DC | PRN
  Administered 2011-11-13: 4 mg via INTRAVENOUS

## 2011-11-13 MED ORDER — DEXTROSE 5 % IV SOLN
INTRAVENOUS | Status: DC | PRN
  Administered 2011-11-13: 14:00:00 via INTRAVENOUS

## 2011-11-13 MED ORDER — 0.9 % SODIUM CHLORIDE (POUR BTL) OPTIME
TOPICAL | Status: DC | PRN
  Administered 2011-11-13: 1000 mL

## 2011-11-13 MED ORDER — MIDAZOLAM HCL 2 MG/2ML IJ SOLN: 1.0000 mg | INTRAMUSCULAR | Status: DC | PRN

## 2011-11-13 MED ORDER — PROPOFOL 10 MG/ML IV EMUL
INTRAVENOUS | Status: DC | PRN
  Administered 2011-11-13: 250 mg via INTRAVENOUS

## 2011-11-13 MED ORDER — HEPARIN SODIUM (PORCINE) 1000 UNIT/ML IJ SOLN
INTRAMUSCULAR | Status: AC
  Filled 2011-11-13: qty 1

## 2011-11-13 MED ORDER — CEFAZOLIN SODIUM 1-5 GM-% IV SOLN
INTRAVENOUS | Status: AC
  Filled 2011-11-13: qty 50

## 2011-11-13 SURGICAL SUPPLY — 44 items
BAG BANDED W/RUBBER/TAPE 36X54 (MISCELLANEOUS) ×2 IMPLANT
BAG DECANTER FOR FLEXI CONT (MISCELLANEOUS) ×2 IMPLANT
CATH CANNON HEMO 15F 50CM (CATHETERS) IMPLANT
CATH CANNON HEMO 15FR 19 (HEMODIALYSIS SUPPLIES) IMPLANT
CATH CANNON HEMO 15FR 23CM (HEMODIALYSIS SUPPLIES) ×2 IMPLANT
CATH CANNON HEMO 15FR 31CM (HEMODIALYSIS SUPPLIES) IMPLANT
CATH CANNON HEMO 15FR 32CM (HEMODIALYSIS SUPPLIES) IMPLANT
CHLORAPREP W/TINT 26ML (MISCELLANEOUS) ×2 IMPLANT
CLOTH BEACON ORANGE TIMEOUT ST (SAFETY) ×2 IMPLANT
COVER DOME SNAP 22 D (MISCELLANEOUS) ×2 IMPLANT
COVER PROBE W GEL 5X96 (DRAPES) ×2 IMPLANT
COVER SURGICAL LIGHT HANDLE (MISCELLANEOUS) IMPLANT
DECANTER SPIKE VIAL GLASS SM (MISCELLANEOUS) IMPLANT
DERMABOND ADHESIVE PROPEN (GAUZE/BANDAGES/DRESSINGS) ×1
DERMABOND ADVANCED .7 DNX6 (GAUZE/BANDAGES/DRESSINGS) ×1 IMPLANT
DRAPE C-ARM 42X72 X-RAY (DRAPES) IMPLANT
DRAPE CHEST BREAST 15X10 FENES (DRAPES) ×2 IMPLANT
GAUZE SPONGE 2X2 8PLY STRL LF (GAUZE/BANDAGES/DRESSINGS) ×1 IMPLANT
GAUZE SPONGE 4X4 16PLY XRAY LF (GAUZE/BANDAGES/DRESSINGS) ×2 IMPLANT
GLOVE BIO SURGEON STRL SZ7.5 (GLOVE) ×2 IMPLANT
GLOVE BIOGEL PI IND STRL 7.5 (GLOVE) ×1 IMPLANT
GLOVE BIOGEL PI INDICATOR 7.5 (GLOVE) ×1
GLOVE SURG SS PI 7.5 STRL IVOR (GLOVE) ×2 IMPLANT
GOWN PREVENTION PLUS XLARGE (GOWN DISPOSABLE) ×4 IMPLANT
GOWN STRL NON-REIN LRG LVL3 (GOWN DISPOSABLE) IMPLANT
KIT BASIN OR (CUSTOM PROCEDURE TRAY) ×2 IMPLANT
KIT ROOM TURNOVER OR (KITS) ×2 IMPLANT
NEEDLE 18GX1X1/2 (RX/OR ONLY) (NEEDLE) ×2 IMPLANT
NEEDLE HYPO 25GX1X1/2 BEV (NEEDLE) ×2 IMPLANT
NS IRRIG 1000ML POUR BTL (IV SOLUTION) ×2 IMPLANT
PACK SURGICAL SETUP 50X90 (CUSTOM PROCEDURE TRAY) ×2 IMPLANT
PAD ARMBOARD 7.5X6 YLW CONV (MISCELLANEOUS) ×4 IMPLANT
SPONGE GAUZE 2X2 STER 10/PKG (GAUZE/BANDAGES/DRESSINGS) ×1
SUT ETHILON 3 0 PS 1 (SUTURE) ×2 IMPLANT
SUT VICRYL 4-0 PS2 18IN ABS (SUTURE) ×2 IMPLANT
SYR 20CC LL (SYRINGE) ×4 IMPLANT
SYR 30ML LL (SYRINGE) IMPLANT
SYR 5ML LL (SYRINGE) ×4 IMPLANT
SYR CONTROL 10ML LL (SYRINGE) ×2 IMPLANT
SYRINGE 10CC LL (SYRINGE) ×2 IMPLANT
TAPE CLOTH SURG 4X10 WHT LF (GAUZE/BANDAGES/DRESSINGS) ×2 IMPLANT
TOWEL OR 17X24 6PK STRL BLUE (TOWEL DISPOSABLE) ×2 IMPLANT
TOWEL OR 17X26 10 PK STRL BLUE (TOWEL DISPOSABLE) ×2 IMPLANT
WATER STERILE IRR 1000ML POUR (IV SOLUTION) ×2 IMPLANT

## 2011-11-13 NOTE — Anesthesia Preprocedure Evaluation (Addendum)
Anesthesia Evaluation  Patient identified by MRN, date of birth, ID band Patient awake    Reviewed: Allergy & Precautions, H&P , NPO status , Patient's Chart, lab work & pertinent test results  Airway Mallampati: I TM Distance: >3 FB Neck ROM: Full    Dental  (+) Teeth Intact and Dental Advisory Given   Pulmonary neg pulmonary ROS,  breath sounds clear to auscultation        Cardiovascular Rhythm:Regular Rate:Normal     Neuro/Psych Anxiety negative neurological ROS     GI/Hepatic negative GI ROS, Neg liver ROS,   Endo/Other  Well Controlled, Type 2Since 2003 Pt has lost over 85 lbs. Has not taken any diabetic oral med since.  Glucose 61 @ present Dr. Gypsy Balsam informed.  Pt states "I feel fine "  Renal/GU ESRF and DialysisRenal disease     Musculoskeletal negative musculoskeletal ROS (+)   Abdominal   Peds  Hematology negative hematology ROS (+)   Anesthesia Other Findings   Reproductive/Obstetrics                         Anesthesia Physical Anesthesia Plan  ASA: III  Anesthesia Plan: General   Post-op Pain Management:    Induction: Intravenous  Airway Management Planned: LMA  Additional Equipment:   Intra-op Plan:   Post-operative Plan: Extubation in OR  Informed Consent: I have reviewed the patients History and Physical, chart, labs and discussed the procedure including the risks, benefits and alternatives for the proposed anesthesia with the patient or authorized representative who has indicated his/her understanding and acceptance.   Dental advisory given  Plan Discussed with: CRNA and Surgeon  Anesthesia Plan Comments:        Anesthesia Quick Evaluation

## 2011-11-13 NOTE — Anesthesia Postprocedure Evaluation (Signed)
  Anesthesia Post-op Note  Patient: Randall [name]  Procedure(s) Performed: Procedure(s) (LRB) with comments: INSERTION OF DIALYSIS CATHETER (Right) - Insertion of 23cm dialysis catheter in right IJ  Patient Location: PACU  Anesthesia Type: General  Level of Consciousness: awake  Airway and Oxygen Therapy: Patient Spontanous Breathing  Post-op Pain: mild  Post-op Assessment: Post-op Vital signs reviewed, Patient's Cardiovascular Status Stable, Respiratory Function Stable, Patent Airway, No signs of Nausea or vomiting and Pain level controlled  Post-op Vital Signs: stable  Complications: No apparent anesthesia complications

## 2011-11-13 NOTE — Procedures (Signed)
Interventional Radiology Procedure Note  Procedure: Unsuccessful LUE native Cimino (radiocephlic) declot.  Very tight thrombosed stenosis between two pseudoaneurysmal segments, could not cross.  Additionally, pt's draining cephalic vein branches into multiple collaterals (which are thrombosed) at the elbow by US examination Complications: None Recommendations: - Pt will return to dialysis access center for [facility] cath placement - K is 5.0, pt in no distress currently Signed,  Sterling Big, MD Vascular & Interventional Radiologist [facility]

## 2011-11-13 NOTE — H&P (Signed)
  [facility] SHORT STAY H&P  CC:  Clotted fistula  HPI: Pt with aneurysmal degeneration of AVF.  Fistula is now clotted.  He needs hemodialysis access.  Past Medical History  Diagnosis Date  . Chronic kidney disease     kidney failure  . Nausea   . Vomiting   . Stomach cramps   . Fever and chills   . Diabetes mellitus     FH:  Non-Contributory  Social HX History  Substance Use Topics  . Smoking status: Never Smoker   . Smokeless tobacco: Never Used  . Alcohol Use: No    Allergies No Known Allergies  Medications No current facility-administered medications for this encounter.   Current Outpatient Prescriptions  Medication Sig Dispense Refill  . acetaminophen (TYLENOL) 500 MG tablet Take 1,000 mg by mouth every 6 (six) hours as needed. For pain      . calcium acetate (PHOSLO) 667 MG capsule Take 1,334 mg by mouth 3 (three) times daily with meals.      . cinacalcet (SENSIPAR) 60 MG tablet Take 60 mg by mouth at bedtime.       Marland Kitchen ezetimibe (ZETIA) 10 MG tablet Take 10 mg by mouth at bedtime.       Marland Kitchen FOSRENOL 1000 MG chewable tablet Chew 3,000 mg by mouth 3 (three) times daily with meals. 1 tablet with snacks       Facility-Administered Medications Ordered in Other Encounters  Medication Dose Route Frequency Provider Last Rate Last Dose  . alteplase (CATHFLO ACTIVASE) injection 2 mg  2 mg Intracatheter Once Malachy Moan, MD      . heparin 1000 UNIT/ML injection           . iohexol (OMNIPAQUE) 300 MG/ML solution 100 mL  100 mL Intravenous Once PRN Trevor Iha, MD   30 mL at 11/13/11 1107    Labs  PHYSICAL EXAM  General:  WDWN in NAD HENT: WNL Pulmonary: normal non-labored breathing , without Rales, rhonchi,  wheezing Cardiac: RRR Vascular Exam/Pulses:  Thrombosed AV fistula Neuro A&O x 3; good sensation; motion in all extremities  Impression: needs hemodialysis access  Plan: Diatek catheter today.  Outpt planning for new permanent  access.  Lyle Leisner E @TODAY @ 12:51 PM

## 2011-11-13 NOTE — Transfer of Care (Signed)
Immediate Anesthesia Transfer of Care Note  Patient: Randall [name]  Procedure(s) Performed: Procedure(s) (LRB) with comments: INSERTION OF DIALYSIS CATHETER (Right) - Insertion of 23cm dialysis catheter in right IJ  Patient Location: PACU  Anesthesia Type: General  Level of Consciousness: awake, alert  and oriented  Airway & Oxygen Therapy: Patient Spontanous Breathing and Patient connected to nasal cannula oxygen  Post-op Assessment: Report given to PACU RN and Post -op Vital signs reviewed and stable  Post vital signs: Reviewed and stable  Complications: No apparent anesthesia complications

## 2011-11-13 NOTE — Progress Notes (Signed)
Spoke with Lenny Pastel PA.  He is aware of pt procedure today and k++  Of 5.0.   States pt is to have dialysis tomorrow morning at  His usual HD center.

## 2011-11-13 NOTE — Preoperative (Signed)
Beta Blockers   Reason not to administer Beta Blockers:Not Applicable 

## 2011-11-13 NOTE — ED Notes (Signed)
Last dialysis Friday

## 2011-11-13 NOTE — H&P (Signed)
Randall [name] is an 46 y.o. male.   Chief Complaint: left arm dialysis fistula clotted Last use 11/10/11 without problem No recent imaging Scheduled now for thrombolysis and possible angioplasty/stent placement. Possible dialysis catheter placement if needed HPI: ESRD; DM  Past Medical History  Diagnosis Date  . Chronic kidney disease     kidney failure  . Nausea   . Vomiting   . Stomach cramps   . Fever and chills   . Diabetes mellitus     Past Surgical History  Procedure Date  . Gastric restriction surgery 12/06/09    lap band  . Dialysis fistula creation 2011    Family History  Problem Relation Age of Onset  . Hypertension Mother   . Diabetes Mother   . Hypertension Father   . Diabetes Father    Social History:  reports that he has never smoked. He has never used smokeless tobacco. He reports that he does not drink alcohol or use illicit drugs.  Allergies: No Known Allergies   (Not in a hospital admission)  Results for orders placed during the hospital encounter of 11/12/11 (from the past 48 hour(s))  POCT I-STAT, CHEM 8     Status: Abnormal   Collection Time   11/12/11  3:35 PM      Component Value Range Comment   Sodium 137  135 - 145 mEq/L    Potassium 4.8  3.5 - 5.1 mEq/L    Chloride 102  96 - 112 mEq/L    BUN 99 (*) 6 - 23 mg/dL    Creatinine, Ser 16.10 (*) 0.50 - 1.35 mg/dL    Glucose, Bld 960 (*) 70 - 99 mg/dL    Calcium, Ion 4.54 (*) 1.12 - 1.23 mmol/L    TCO2 21  0 - 100 mmol/L    Hemoglobin 12.9 (*) 13.0 - 17.0 g/dL    HCT 09.8 (*) 11.9 - 52.0 %    No results found.  Review of Systems  Constitutional: Negative for fever and chills.  Cardiovascular: Negative for chest pain.  Gastrointestinal: Negative for nausea and vomiting.    Blood pressure 124/80, pulse 87, temperature 97.8 F (36.6 C), temperature source Oral, resp. rate 16, SpO2 96.00%. Physical Exam  Constitutional: He is oriented to person, place, and time. He appears well-developed  and well-nourished.  Cardiovascular: Normal rate and regular rhythm.   Respiratory: Effort normal and breath sounds normal. He has no wheezes.  GI: Soft. Bowel sounds are normal.  Musculoskeletal: Normal range of motion.       Left arm dialysis fistula; slight distal thrill  Neurological: He is alert and oriented to person, place, and time.  Skin: Skin is warm and dry.  Psychiatric: He has a normal mood and affect. His behavior is normal. Judgment and thought content normal.     Assessment/Plan ESRD Clotted lt arm dialysis fistula Scheduled now for thrombolysis and poss pta/stent Possible dialysis catheter placement if needed Pt aware pt procedure benefits and agreeable to proceed Consent in chart  Conall Vangorder A 11/13/2011, 8:24 AM

## 2011-11-13 NOTE — Anesthesia Procedure Notes (Signed)
Procedure Name: LMA Insertion Date/Time: 11/13/2011 2:03 PM Performed by: Tyrone Nine Pre-anesthesia Checklist: Patient identified, Timeout performed, Emergency Drugs available, Suction available and Patient being monitored Patient Re-evaluated:Patient Re-evaluated prior to inductionOxygen Delivery Method: Circle system utilized Preoxygenation: Pre-oxygenation with 100% oxygen Intubation Type: IV induction Ventilation: Mask ventilation without difficulty LMA: LMA inserted LMA Size: 5.0 Number of attempts: 1 Placement Confirmation: positive ETCO2 and breath sounds checked- equal and bilateral Tube secured with: Tape Dental Injury: Teeth and Oropharynx as per pre-operative assessment

## 2011-11-13 NOTE — H&P (Signed)
Agree with above.  Initial US examination of AVF at the bedside reveals a thrombosed Cimino AVF.  The draining cephalic vein ramifies into several smaller branches just proximal to the elbow and the small branches all appear thrombosed.   This anatomy of several smaller thrombosed branches makes a successful declot procedure unlikely.  It will be difficult to declot all of the branches to provide enough flow to prevent re-thrombosis.  We will proceed and give every effort to salvaging this dialysis access.  If unsuccessful, pt will need a permacath for access until surgical revision or new AVF can be created.  Signed,  Sterling Big, MD Vascular [facility] Interventional Radiologist [facility]

## 2011-11-13 NOTE — Op Note (Signed)
Procedure: Ultrasound-guided insertion of Diatek catheter  Preoperative diagnosis: End-stage renal disease  Postoperative diagnosis: Same  Anesthesia: Local with IV sedation  Operative findings: 23 cm Diatek catheter right internal jugular vein  Operative details: After obtaining informed consent, the patient was taken to the operating room. The patient was placed in supine position on the operating room table. After adequate sedation the patient's entire neck and chest were prepped and draped in usual sterile fashion. The patient was placed in Trendelenburg position. Ultrasound was used to identify the patient's right internal jugular vein. This had normal compressibility and respiratory variation. Local anesthesia was infiltrated over the right jugular vein.  Using ultrasound guidance, the right internal jugular vein was successfully cannulated.  A 0.035 J-tipped guidewire was threaded into the right internal jugular vein and into the superior vena cava followed by the inferior vena cava under fluoroscopic guidance.   Next sequential 12 and 14 dilators were placed over the guidewire into the right atrium.  A 16 French dilator with a peel-away sheath was then placed over the guidewire into the right atrium.   The guidewire and dilator were removed. A 23 cm Diatek catheter was then placed through the peel away sheath into the right atrium.  The catheter was then tunneled subcutaneously, cut to length, and the hub attached. The catheter was noted to flush and draw easily. The catheter was inspected under fluoroscopy and found with its tip to be in the right atrium without any kinks throughout its course. The catheter was sutured to the skin with nylon sutures. The neck insertion site was closed with Vicryl stitch. The catheter was then loaded with concentrated Heparin solution. A dry sterile dressing was applied.  The patient tolerated procedure well and there were no complications. Instrument sponge and  needle counts correct in the case. The patient was taken to the recovery room in stable condition. Chest x-ray will be obtained in the recovery room.  Charles Fields, MD [facility] Office: [phone] Pager: [phone] 

## 2011-11-14 ENCOUNTER — Telehealth: Payer: Self-pay | Admitting: Vascular Surgery

## 2011-11-14 ENCOUNTER — Other Ambulatory Visit: Payer: Self-pay | Admitting: *Deleted

## 2011-11-14 ENCOUNTER — Encounter (HOSPITAL_COMMUNITY): Payer: Self-pay | Admitting: Vascular Surgery

## 2011-11-14 DIAGNOSIS — Z01810 Encounter for preprocedural cardiovascular examination: Secondary | ICD-10-CM

## 2011-11-14 DIAGNOSIS — N186 End stage renal disease: Secondary | ICD-10-CM

## 2011-11-14 MED FILL — Dextrose Inj 50%: INTRAVENOUS | Qty: 50 | Status: AC

## 2011-11-14 NOTE — Telephone Encounter (Addendum)
Message copied by Rosalyn Charters on Wed Nov 14, 2011  9:25 AM ------      Message from: Sherren Kerns      Created: Tue Nov 13, 2011  2:50 PM       Ultrasound neck      Right IJ diatek            Needs office visit 1-2 weeks with vein mapping left and right arms can be for anyone's schedule whoever is available first.            Leonette Most  notified patient of fu appt. on 11-27-11 3pm

## 2011-11-20 ENCOUNTER — Other Ambulatory Visit: Payer: Self-pay | Admitting: *Deleted

## 2011-11-20 DIAGNOSIS — Z01810 Encounter for preprocedural cardiovascular examination: Secondary | ICD-10-CM

## 2011-11-20 DIAGNOSIS — N186 End stage renal disease: Secondary | ICD-10-CM

## 2011-11-26 ENCOUNTER — Encounter: Payer: Self-pay | Admitting: Vascular Surgery

## 2011-11-27 ENCOUNTER — Encounter (INDEPENDENT_AMBULATORY_CARE_PROVIDER_SITE_OTHER): Payer: 59 | Admitting: *Deleted

## 2011-11-27 ENCOUNTER — Ambulatory Visit (INDEPENDENT_AMBULATORY_CARE_PROVIDER_SITE_OTHER): Payer: 59 | Admitting: Vascular Surgery

## 2011-11-27 ENCOUNTER — Encounter: Payer: Self-pay | Admitting: Vascular Surgery

## 2011-11-27 VITALS — BP 110/60 | HR 96 | Resp 18 | Ht 71.0 in | Wt 269.7 lb

## 2011-11-27 DIAGNOSIS — N186 End stage renal disease: Secondary | ICD-10-CM

## 2011-11-27 DIAGNOSIS — Z01810 Encounter for preprocedural cardiovascular examination: Secondary | ICD-10-CM

## 2011-11-27 NOTE — Progress Notes (Signed)
Patient presents today for discussion of AV access for hemodialysis. He had a left forearm fistula created approximately 3 years ago Dr. Myra Gianotti in April 2011. He is a good function of the since that time. Recently he had occlusion of this and had attempted lysis in interventional radiology which was unsuccessful and subsequently had a catheter placed on September 3. He is currently being dialyzed via the temporary catheter. He is here today for discussion of permanent access.  Past Medical History  Diagnosis Date  . Chronic kidney disease     kidney failure  . Nausea   . Vomiting   . Stomach cramps   . Fever and chills   . Diabetes mellitus     History  Substance Use Topics  . Smoking status: Never Smoker   . Smokeless tobacco: Never Used  . Alcohol Use: No    Family History  Problem Relation Age of Onset  . Hypertension Mother   . Diabetes Mother   . Hypertension Father   . Diabetes Father     No Known Allergies  Current outpatient prescriptions:acetaminophen (TYLENOL) 500 MG tablet, Take 1,000 mg by mouth every 6 (six) hours as needed. For pain, Disp: , Rfl: ;  calcium acetate (PHOSLO) 667 MG capsule, Take 1,334 mg by mouth 3 (three) times daily with meals., Disp: , Rfl: ;  cinacalcet (SENSIPAR) 60 MG tablet, Take 60 mg by mouth at bedtime. , Disp: , Rfl: ;  ezetimibe (ZETIA) 10 MG tablet, Take 10 mg by mouth at bedtime. , Disp: , Rfl:  FOSRENOL 1000 MG chewable tablet, Chew 3,000 mg by mouth 3 (three) times daily with meals. 1 tablet with snacks, Disp: , Rfl:   BP 110/60  Pulse 96  Resp 18  Ht 5\' 11"  (1.803 m)  Wt 269 lb 11.2 oz (122.335 kg)  BMI 37.62 kg/m2  Body mass index is 37.62 kg/(m^2).       Physical exam well-developed moderately obese gentleman in no acute distress 2+ radial pulses bilaterally He does have patency of the most proximal portion of the cephalic radial fistula with large collaterals in the forearm keeping this small segment open. The  remainder of his cephalic vein is thrombosed.  Venous duplex today shows that his cephalic vein is patent from the antecubital fossa proximally with a nice size diameter of .49-.36 above this area.   Impression and plan occluded forearm cephalic fistula with patent cephalic vein above this. I have recommended a left upper arm AV fistula creation. We have scheduled this for 925 as an outpatient at the hospital. He typically dialyzes on Monday Wednesday and Friday. We will arrange to the dialysis unit for dialysis on Monday Tuesday surgery on Wednesday and then dialysis on Friday.

## 2011-11-28 ENCOUNTER — Other Ambulatory Visit: Payer: Self-pay | Admitting: *Deleted

## 2011-11-28 ENCOUNTER — Encounter (HOSPITAL_COMMUNITY): Payer: Self-pay | Admitting: Pharmacy Technician

## 2011-12-04 ENCOUNTER — Encounter (HOSPITAL_COMMUNITY): Payer: Self-pay

## 2011-12-04 MED ORDER — DEXTROSE 5 % IV SOLN
1.5000 g | INTRAVENOUS | Status: DC
  Filled 2011-12-04: qty 1.5

## 2011-12-05 ENCOUNTER — Telehealth: Payer: Self-pay | Admitting: Vascular Surgery

## 2011-12-05 ENCOUNTER — Encounter (HOSPITAL_COMMUNITY)
Admission: RE | Disposition: A | Discharge status: Home / Self Care | Payer: Self-pay | Source: Ambulatory Visit | Attending: Vascular Surgery

## 2011-12-05 ENCOUNTER — Other Ambulatory Visit: Payer: Self-pay | Admitting: *Deleted

## 2011-12-05 ENCOUNTER — Encounter (HOSPITAL_COMMUNITY): Payer: Self-pay | Admitting: Certified Registered Nurse Anesthetist

## 2011-12-05 ENCOUNTER — Ambulatory Visit (HOSPITAL_COMMUNITY)
Admission: RE | Admit: 2011-12-05 | Discharge: 2011-12-05 | Disposition: A | Discharge status: Home / Self Care | Payer: 59 | Source: Ambulatory Visit | Attending: Vascular Surgery | Admitting: Vascular Surgery

## 2011-12-05 ENCOUNTER — Ambulatory Visit (HOSPITAL_COMMUNITY): Payer: 59 | Admitting: Certified Registered Nurse Anesthetist

## 2011-12-05 ENCOUNTER — Encounter (HOSPITAL_COMMUNITY): Payer: Self-pay | Admitting: *Deleted

## 2011-12-05 DIAGNOSIS — E119 Type 2 diabetes mellitus without complications: Secondary | ICD-10-CM | POA: Insufficient documentation

## 2011-12-05 DIAGNOSIS — N186 End stage renal disease: Secondary | ICD-10-CM | POA: Insufficient documentation

## 2011-12-05 DIAGNOSIS — Z4931 Encounter for adequacy testing for hemodialysis: Secondary | ICD-10-CM

## 2011-12-05 DIAGNOSIS — Z992 Dependence on renal dialysis: Secondary | ICD-10-CM | POA: Insufficient documentation

## 2011-12-05 HISTORY — PX: AV FISTULA PLACEMENT: SHX1204

## 2011-12-05 HISTORY — DX: Disorder of thyroid, unspecified: E07.9

## 2011-12-05 HISTORY — DX: Sleep apnea, unspecified: G47.30

## 2011-12-05 HISTORY — DX: Hyperlipidemia, unspecified: E78.5

## 2011-12-05 LAB — APTT: aPTT: 29 seconds (ref 24–37)

## 2011-12-05 LAB — SURGICAL PCR SCREEN
MRSA, PCR: NEGATIVE
Staphylococcus aureus: NEGATIVE

## 2011-12-05 LAB — POCT I-STAT 4, (NA,K, GLUC, HGB,HCT): Sodium: 138 mEq/L (ref 135–145)

## 2011-12-05 LAB — PROTIME-INR: INR: 1.21 (ref 0.00–1.49)

## 2011-12-05 SURGERY — ARTERIOVENOUS (AV) FISTULA CREATION
Anesthesia: Monitor Anesthesia Care | Site: Arm Upper | Laterality: Left | Wound class: Clean

## 2011-12-05 MED ORDER — PHENYLEPHRINE HCL 10 MG/ML IJ SOLN
INTRAMUSCULAR | Status: DC | PRN
  Administered 2011-12-05: 80 ug via INTRAVENOUS
  Administered 2011-12-05: 40 ug via INTRAVENOUS
  Administered 2011-12-05: 80 ug via INTRAVENOUS

## 2011-12-05 MED ORDER — MIDAZOLAM HCL 5 MG/5ML IJ SOLN
INTRAMUSCULAR | Status: DC | PRN
  Administered 2011-12-05: 2 mg via INTRAVENOUS

## 2011-12-05 MED ORDER — SODIUM CHLORIDE 0.9 % IV SOLN: INTRAVENOUS | Status: DC

## 2011-12-05 MED ORDER — LIDOCAINE HCL (PF) 0.5 % IJ SOLN
INTRAMUSCULAR | Status: AC
  Filled 2011-12-05: qty 50

## 2011-12-05 MED ORDER — HYDROMORPHONE HCL PF 1 MG/ML IJ SOLN
0.2500 mg | INTRAMUSCULAR | Status: DC | PRN
  Administered 2011-12-05 (×2): 0.5 mg via INTRAVENOUS

## 2011-12-05 MED ORDER — SODIUM CHLORIDE 0.9 % IV SOLN
INTRAVENOUS | Status: DC | PRN
  Administered 2011-12-05: 08:00:00 via INTRAVENOUS

## 2011-12-05 MED ORDER — PROPOFOL INFUSION 10 MG/ML OPTIME
INTRAVENOUS | Status: DC | PRN
  Administered 2011-12-05: 100 ug/kg/min via INTRAVENOUS

## 2011-12-05 MED ORDER — MUPIROCIN 2 % EX OINT
TOPICAL_OINTMENT | Freq: Once | CUTANEOUS | Status: AC
  Administered 2011-12-05: 07:00:00 via NASAL

## 2011-12-05 MED ORDER — LIDOCAINE-EPINEPHRINE 0.5 %-1:200000 IJ SOLN
INTRAMUSCULAR | Status: DC | PRN
  Administered 2011-12-05: 7 mL

## 2011-12-05 MED ORDER — DEXTROSE 5 % IV SOLN
1.5000 g | INTRAVENOUS | Status: DC | PRN
  Administered 2011-12-05: 1.5 g via INTRAVENOUS

## 2011-12-05 MED ORDER — SODIUM CHLORIDE 0.9 % IV SOLN
10.0000 mg | INTRAVENOUS | Status: DC | PRN
  Administered 2011-12-05: 50 ug/min via INTRAVENOUS

## 2011-12-05 MED ORDER — 0.9 % SODIUM CHLORIDE (POUR BTL) OPTIME
TOPICAL | Status: DC | PRN
  Administered 2011-12-05: 1000 mL

## 2011-12-05 MED ORDER — MUPIROCIN 2 % EX OINT
TOPICAL_OINTMENT | CUTANEOUS | Status: AC
  Filled 2011-12-05: qty 22

## 2011-12-05 MED ORDER — OXYCODONE HCL 5 MG PO TABS: 5.0000 mg | ORAL_TABLET | Freq: Four times a day (QID) | ORAL | Status: DC | PRN

## 2011-12-05 MED ORDER — HYDROMORPHONE HCL PF 1 MG/ML IJ SOLN
INTRAMUSCULAR | Status: AC
  Filled 2011-12-05: qty 1

## 2011-12-05 MED ORDER — SODIUM CHLORIDE 0.9 % IR SOLN
Status: DC | PRN
  Administered 2011-12-05: 10:00:00

## 2011-12-05 SURGICAL SUPPLY — 33 items
BENZOIN TINCTURE PRP APPL 2/3 (GAUZE/BANDAGES/DRESSINGS) ×2 IMPLANT
CANISTER SUCTION 2500CC (MISCELLANEOUS) ×2 IMPLANT
CLIP LIGATING EXTRA MED SLVR (CLIP) ×2 IMPLANT
CLIP LIGATING EXTRA SM BLUE (MISCELLANEOUS) ×2 IMPLANT
CLOTH BEACON ORANGE TIMEOUT ST (SAFETY) ×2 IMPLANT
CLSR STERI-STRIP ANTIMIC 1/2X4 (GAUZE/BANDAGES/DRESSINGS) ×2 IMPLANT
COVER SURGICAL LIGHT HANDLE (MISCELLANEOUS) ×2 IMPLANT
DECANTER SPIKE VIAL GLASS SM (MISCELLANEOUS) ×2 IMPLANT
ELECT REM PT RETURN 9FT ADLT (ELECTROSURGICAL)
ELECTRODE REM PT RTRN 9FT ADLT (ELECTROSURGICAL) IMPLANT
GEL ULTRASOUND 20GR AQUASONIC (MISCELLANEOUS) IMPLANT
GLOVE BIOGEL PI IND STRL 6.5 (GLOVE) ×5 IMPLANT
GLOVE BIOGEL PI INDICATOR 6.5 (GLOVE) ×5
GLOVE SS BIOGEL STRL SZ 7.5 (GLOVE) ×1 IMPLANT
GLOVE SUPERSENSE BIOGEL SZ 7.5 (GLOVE) ×1
GLOVE SURG SS PI 6.5 STRL IVOR (GLOVE) ×6 IMPLANT
GOWN STRL NON-REIN LRG LVL3 (GOWN DISPOSABLE) ×10 IMPLANT
KIT BASIN OR (CUSTOM PROCEDURE TRAY) ×2 IMPLANT
KIT ROOM TURNOVER OR (KITS) ×2 IMPLANT
NS IRRIG 1000ML POUR BTL (IV SOLUTION) ×2 IMPLANT
PACK CV ACCESS (CUSTOM PROCEDURE TRAY) ×2 IMPLANT
PAD ARMBOARD 7.5X6 YLW CONV (MISCELLANEOUS) ×4 IMPLANT
SPONGE GAUZE 4X4 12PLY (GAUZE/BANDAGES/DRESSINGS) ×2 IMPLANT
STRIP CLOSURE SKIN 1/2X4 (GAUZE/BANDAGES/DRESSINGS) ×2 IMPLANT
SUT PROLENE 6 0 CC (SUTURE) ×4 IMPLANT
SUT SILK 2 0 FS (SUTURE) IMPLANT
SUT VIC AB 3-0 SH 27 (SUTURE) ×2
SUT VIC AB 3-0 SH 27X BRD (SUTURE) ×2 IMPLANT
TAPE CLOTH SURG 4X10 WHT LF (GAUZE/BANDAGES/DRESSINGS) ×2 IMPLANT
TOWEL OR 17X24 6PK STRL BLUE (TOWEL DISPOSABLE) ×2 IMPLANT
TOWEL OR 17X26 10 PK STRL BLUE (TOWEL DISPOSABLE) ×2 IMPLANT
UNDERPAD 30X30 INCONTINENT (UNDERPADS AND DIAPERS) ×2 IMPLANT
WATER STERILE IRR 1000ML POUR (IV SOLUTION) ×2 IMPLANT

## 2011-12-05 NOTE — Progress Notes (Signed)
Good bruit and thrill let arm . No numbness or tingling in fingers.

## 2011-12-05 NOTE — Telephone Encounter (Signed)
Message copied by Margaretmary Eddy on Wed Dec 05, 2011 11:27 AM ------      Message from: Lorin Mercy K      Created: Wed Dec 05, 2011 11:10 AM      Regarding: schedule                   ----- Message -----         From: Dara Lords, PA         Sent: 12/05/2011   9:56 AM           To: Sharee Pimple, CMA            S/p left brachiocephalic AVF by TFE            F/u with him in 4 weeks.            Thanks,      Lelon Mast

## 2011-12-05 NOTE — Transfer of Care (Signed)
Immediate Anesthesia Transfer of Care Note  Patient: Randall [name]  Procedure(s) Performed: Procedure(s) (LRB) with comments: ARTERIOVENOUS (AV) FISTULA CREATION (Left)  Patient Location: PACU  Anesthesia Type: MAC  Level of Consciousness: awake, alert , oriented and patient cooperative  Airway & Oxygen Therapy: Patient Spontanous Breathing and Patient connected to nasal cannula oxygen  Post-op Assessment: Report given to PACU RN, Post -op Vital signs reviewed and stable and Patient moving all extremities X 4  Post vital signs: Reviewed and stable  Complications: No apparent anesthesia complications

## 2011-12-05 NOTE — H&P (View-Only) (Signed)
Patient presents today for discussion of AV access for hemodialysis. He had a left forearm fistula created approximately 3 years ago Dr. Brabham in April 2011. He is a good function of the since that time. Recently he had occlusion of this and had attempted lysis in interventional radiology which was unsuccessful and subsequently had a catheter placed on September 3. He is currently being dialyzed via the temporary catheter. He is here today for discussion of permanent access.  Past Medical History  Diagnosis Date  . Chronic kidney disease     kidney failure  . Nausea   . Vomiting   . Stomach cramps   . Fever and chills   . Diabetes mellitus     History  Substance Use Topics  . Smoking status: Never Smoker   . Smokeless tobacco: Never Used  . Alcohol Use: No    Family History  Problem Relation Age of Onset  . Hypertension Mother   . Diabetes Mother   . Hypertension Father   . Diabetes Father     No Known Allergies  Current outpatient prescriptions:acetaminophen (TYLENOL) 500 MG tablet, Take 1,000 mg by mouth every 6 (six) hours as needed. For pain, Disp: , Rfl: ;  calcium acetate (PHOSLO) 667 MG capsule, Take 1,334 mg by mouth 3 (three) times daily with meals., Disp: , Rfl: ;  cinacalcet (SENSIPAR) 60 MG tablet, Take 60 mg by mouth at bedtime. , Disp: , Rfl: ;  ezetimibe (ZETIA) 10 MG tablet, Take 10 mg by mouth at bedtime. , Disp: , Rfl:  FOSRENOL 1000 MG chewable tablet, Chew 3,000 mg by mouth 3 (three) times daily with meals. 1 tablet with snacks, Disp: , Rfl:   BP 110/60  Pulse 96  Resp 18  Ht 5' 11" (1.803 m)  Wt 269 lb 11.2 oz (122.335 kg)  BMI 37.62 kg/m2  Body mass index is 37.62 kg/(m^2).       Physical exam well-developed moderately obese gentleman in no acute distress 2+ radial pulses bilaterally He does have patency of the most proximal portion of the cephalic radial fistula with large collaterals in the forearm keeping this small segment open. The  remainder of his cephalic vein is thrombosed.  Venous duplex today shows that his cephalic vein is patent from the antecubital fossa proximally with a nice size diameter of .49-.36 above this area.   Impression and plan occluded forearm cephalic fistula with patent cephalic vein above this. I have recommended a left upper arm AV fistula creation. We have scheduled this for 925 as an outpatient at the hospital. He typically dialyzes on Monday Wednesday and Friday. We will arrange to the dialysis unit for dialysis on Monday Tuesday surgery on Wednesday and then dialysis on Friday.  

## 2011-12-05 NOTE — Op Note (Signed)
OPERATIVE REPORT  DATE OF SURGERY: 12/05/2011  PATIENT: Randall [name], 46 y.o. male MRN: [id number]  DOB: 08/28/1965  PRE-OPERATIVE DIAGNOSIS: End-stage renal disease  POST-OPERATIVE DIAGNOSIS:  Same  PROCEDURE: Left upper arm AV fistula creation  SURGEON:  Gretta Began, M.D.  PHYSICIAN ASSISTANT: Rhyne  ANESTHESIA:  Local with sedation  EBL: Minimal ml  Total I/O In: 350 [I.V.:350] Out: -   BLOOD ADMINISTERED: None  DRAINS: None  SPECIMEN: None  COUNTS CORRECT:  YES  PLAN OF CARE: PACU   PATIENT DISPOSITION:  PACU - hemodynamically stable  PROCEDURE DETAILS: Patient was taken to the operating placed supine position where the area of the left arm was prepped and draped in a sterile fashion. Decision was made using local anesthesia over the antecubital space and carried down to the level of the cephalic vein which was of good caliber and the brachial artery which was of a large caliber. The vein was ligated distally interpreter branches were ligated and divided. The vein was brought into approximation the brachial artery. The brachial artery was occluded proximally and distally was opened 11 blade and extended longitudinally with Potts scissors. The vein was spatulated and sewn end-to-side to the artery with a running 6-0 Prolene suture. Clamps removed and excellent thrill was noted. The wounds were irrigated with saline. Hemostasis obtained with cautery. Wounds were closed with 3-0 Vicryl the subcutaneous and subcuticular tissue. Benzoin Steri-Strips were applied   Gretta Began, M.D. 12/05/2011 10:31 AM

## 2011-12-05 NOTE — Telephone Encounter (Signed)
Message copied by Margaretmary Eddy on Wed Dec 05, 2011  1:34 PM ------      Message from: Melene Plan      Created: Wed Dec 05, 2011 12:34 PM                   ----- Message -----         From: Larina Earthly, MD         Sent: 12/05/2011  10:33 AM           To: Reuel Derby, Melene Plan, RN            Left upper arm AV fistula. Risk manager. I need to see him in the office in one month. Does not need duplex            Sunday Corn was canceled. She did not need revision having had successful treatment in interventional radiology

## 2011-12-05 NOTE — Preoperative (Signed)
Beta Blockers   Reason not to administer Beta Blockers:Not Applicable 

## 2011-12-05 NOTE — Anesthesia Postprocedure Evaluation (Signed)
  Anesthesia Post-op Note  Patient: Randall [name]  Procedure(s) Performed: Procedure(s) (LRB) with comments: ARTERIOVENOUS (AV) FISTULA CREATION (Left)  Patient Location: PACU  Anesthesia Type: MAC  Level of Consciousness: awake  Airway and Oxygen Therapy: Patient Spontanous Breathing  Post-op Pain: mild  Post-op Assessment: Post-op Vital signs reviewed  Post-op Vital Signs: Reviewed  Complications: No apparent anesthesia complications

## 2011-12-05 NOTE — Anesthesia Preprocedure Evaluation (Addendum)
Anesthesia Evaluation  Patient identified by MRN, date of birth, ID band Patient awake    Reviewed: Allergy & Precautions, H&P , NPO status , Patient's Chart, lab work & pertinent test results  Airway Mallampati: II TM Distance: >3 FB Neck ROM: Full    Dental  (+) Dental Advisory Given   Pulmonary sleep apnea and Continuous Positive Airway Pressure Ventilation ,          Cardiovascular     Neuro/Psych    GI/Hepatic negative GI ROS, Neg liver ROS,   Endo/Other  diabetes, Type 2  Renal/GU CRFRenal disease     Musculoskeletal   Abdominal   Peds  Hematology   Anesthesia Other Findings   Reproductive/Obstetrics                          Anesthesia Physical Anesthesia Plan  ASA: III  Anesthesia Plan: MAC   Post-op Pain Management:    Induction: Intravenous  Airway Management Planned: Simple Face Mask  Additional Equipment:   Intra-op Plan:   Post-operative Plan:   Informed Consent: I have reviewed the patients History and Physical, chart, labs and discussed the procedure including the risks, benefits and alternatives for the proposed anesthesia with the patient or authorized representative who has indicated his/her understanding and acceptance.   Dental advisory given  Plan Discussed with: CRNA, Anesthesiologist and Surgeon  Anesthesia Plan Comments:        Anesthesia Quick Evaluation

## 2011-12-05 NOTE — Interval H&P Note (Signed)
History and Physical Interval Note:  12/05/2011 8:22 AM  Randall [name]  has presented today for surgery, with the diagnosis of ESRD  The various methods of treatment have been discussed with the patient and family. After consideration of risks, benefits and other options for treatment, the patient has consented to  Procedure(s) (LRB) with comments: INSERTION OF ARTERIOVENOUS (AV) GORE-TEX GRAFT ARM (Left) as a surgical intervention .  The patient's history has been reviewed, patient examined, no change in status, stable for surgery.  I have reviewed the patient's chart and labs.  Questions were answered to the patient's satisfaction.     [name]

## 2011-12-06 ENCOUNTER — Encounter (HOSPITAL_COMMUNITY): Payer: Self-pay | Admitting: Vascular Surgery

## 2011-12-16 ENCOUNTER — Encounter (HOSPITAL_COMMUNITY): Payer: Self-pay

## 2011-12-16 ENCOUNTER — Emergency Department (HOSPITAL_COMMUNITY): Payer: 59

## 2011-12-16 ENCOUNTER — Emergency Department (HOSPITAL_COMMUNITY)
Admission: EM | Admit: 2011-12-16 | Discharge: 2011-12-16 | Disposition: A | Discharge status: Home / Self Care | Payer: 59 | Attending: Emergency Medicine | Admitting: Emergency Medicine

## 2011-12-16 DIAGNOSIS — R079 Chest pain, unspecified: Secondary | ICD-10-CM | POA: Insufficient documentation

## 2011-12-16 DIAGNOSIS — R109 Unspecified abdominal pain: Secondary | ICD-10-CM | POA: Insufficient documentation

## 2011-12-16 DIAGNOSIS — N189 Chronic kidney disease, unspecified: Secondary | ICD-10-CM | POA: Insufficient documentation

## 2011-12-16 DIAGNOSIS — R0602 Shortness of breath: Secondary | ICD-10-CM | POA: Insufficient documentation

## 2011-12-16 DIAGNOSIS — E119 Type 2 diabetes mellitus without complications: Secondary | ICD-10-CM | POA: Insufficient documentation

## 2011-12-16 LAB — CBC
HCT: 36.2 % — ABNORMAL LOW (ref 39.0–52.0)
Hemoglobin: 12 g/dL — ABNORMAL LOW (ref 13.0–17.0)
MCH: 30.8 pg (ref 26.0–34.0)
MCV: 92.8 fL (ref 78.0–100.0)
RBC: 3.9 MIL/uL — ABNORMAL LOW (ref 4.22–5.81)
WBC: 12.7 10*3/uL — ABNORMAL HIGH (ref 4.0–10.5)

## 2011-12-16 LAB — BASIC METABOLIC PANEL
BUN: 57 mg/dL — ABNORMAL HIGH (ref 6–23)
CO2: 19 mEq/L (ref 19–32)
Calcium: 9.4 mg/dL (ref 8.4–10.5)
Chloride: 96 mEq/L (ref 96–112)
Creatinine, Ser: 15.07 mg/dL — ABNORMAL HIGH (ref 0.50–1.35)
Glucose, Bld: 172 mg/dL — ABNORMAL HIGH (ref 70–99)

## 2011-12-16 LAB — LACTIC ACID, PLASMA: Lactic Acid, Venous: 3.8 mmol/L — ABNORMAL HIGH (ref 0.5–2.2)

## 2011-12-16 LAB — POCT I-STAT TROPONIN I: Troponin i, poc: 0.01 ng/mL (ref 0.00–0.08)

## 2011-12-16 MED ORDER — IOHEXOL 300 MG/ML  SOLN
100.0000 mL | Freq: Once | INTRAMUSCULAR | Status: AC | PRN
  Administered 2011-12-16: 100 mL via INTRAVENOUS

## 2011-12-16 MED ORDER — HYDROMORPHONE HCL PF 1 MG/ML IJ SOLN
1.0000 mg | Freq: Once | INTRAMUSCULAR | Status: AC
  Administered 2011-12-16: 1 mg via INTRAVENOUS
  Filled 2011-12-16: qty 1

## 2011-12-16 MED ORDER — MORPHINE SULFATE 4 MG/ML IJ SOLN
4.0000 mg | Freq: Once | INTRAMUSCULAR | Status: AC
  Administered 2011-12-16: 4 mg via INTRAVENOUS
  Filled 2011-12-16: qty 1

## 2011-12-16 MED ORDER — HYOSCYAMINE SULFATE 0.125 MG PO TABS
0.2500 mg | ORAL_TABLET | Freq: Once | ORAL | Status: AC
  Administered 2011-12-16: 0.25 mg via ORAL
  Filled 2011-12-16: qty 2

## 2011-12-16 MED ORDER — IOHEXOL 300 MG/ML  SOLN: 20.0000 mL | INTRAMUSCULAR | Status: AC

## 2011-12-16 MED ORDER — OXYCODONE-ACETAMINOPHEN 5-325 MG PO TABS: 2.0000 | ORAL_TABLET | ORAL | Status: DC | PRN

## 2011-12-16 MED ORDER — IOHEXOL 300 MG/ML  SOLN
40.0000 mL | Freq: Once | INTRAMUSCULAR | Status: AC | PRN
  Administered 2011-12-16: 40 mL via ORAL

## 2011-12-16 MED ORDER — PANTOPRAZOLE SODIUM 40 MG IV SOLR
40.0000 mg | Freq: Once | INTRAVENOUS | Status: AC
  Administered 2011-12-16: 40 mg via INTRAVENOUS
  Filled 2011-12-16: qty 40

## 2011-12-16 MED ORDER — ONDANSETRON HCL 4 MG/2ML IJ SOLN
4.0000 mg | Freq: Once | INTRAMUSCULAR | Status: AC
  Administered 2011-12-16: 4 mg via INTRAVENOUS
  Filled 2011-12-16: qty 2

## 2011-12-16 NOTE — ED Notes (Signed)
Pt states that he is unable to drink contrast because it will make him sick

## 2011-12-16 NOTE — ED Notes (Signed)
Paged IV team x2 

## 2011-12-16 NOTE — ED Notes (Signed)
Pt son also stated that his dad said he took a medication but the son is unsure of what the pt took

## 2011-12-16 NOTE — ED Provider Notes (Addendum)
History     CSN: [id number]  Arrival date & time 12/16/11  1551   First MD Initiated Contact with Patient 12/16/11 1603      Chief Complaint  Patient presents with  . Chest Pain  . Emesis    (Consider location/radiation/quality/duration/timing/severity/associated sxs/prior treatment) The history is provided by the patient.   46 year old, male, with a history of end-stage renal disease, presents to emergency department complaining of abdominal pain, with vomiting.  For the past 3 days.  He, says he was at a social function, and after eating he developed abdominal pain, and vomiting.  He denied diarrhea.  This morning, developed, chest pain, as well.  The chest.  Pain, is associated with shortness of breath.  His chest.  Pain, started this morning. He still has nausea and vomiting.  He has not had fevers, chills, cough, sweating.  He denies a history of myocardial infarction, or heart failure.  He denies a history of peptic ulcer disease.  He has not had blood in his emesis.  Level V caveat applies for severe pain  Past Medical History  Diagnosis Date  . Chronic kidney disease     kidney failure  . Nausea   . Vomiting   . Stomach cramps   . Fever and chills   . Hyperlipidemia   . Thyroid disorder     takes sensipar  . Sleep apnea     "does not have to use cpap anymore"  . Diabetes mellitus     controlling by weight/diet    Past Surgical History  Procedure Date  . Gastric restriction surgery 12/06/09    lap band  . Dialysis fistula creation 2011  . Insertion of dialysis catheter 11/13/2011    Procedure: INSERTION OF DIALYSIS CATHETER;  Surgeon: Sherren Kerns, MD;  Location: [facility] OR;  Service: Vascular;  Laterality: Right;  Insertion of 23cm dialysis catheter in right IJ  . Av fistula placement 12/05/2011    Procedure: ARTERIOVENOUS (AV) FISTULA CREATION;  Surgeon: Larina Earthly, MD;  Location: [facility] OR;  Service: Vascular;  Laterality: Left;    Family History  Problem Relation  Age of Onset  . Hypertension Mother   . Diabetes Mother   . Hypertension Father   . Diabetes Father     History  Substance Use Topics  . Smoking status: Never Smoker   . Smokeless tobacco: Never Used  . Alcohol Use: No      Review of Systems  Constitutional: Negative for fever, chills and diaphoresis.  HENT: Negative for neck pain.   Respiratory: Positive for shortness of breath. Negative for cough.   Cardiovascular: Positive for chest pain. Negative for leg swelling.  Gastrointestinal: Positive for nausea, vomiting and abdominal pain. Negative for diarrhea.  Musculoskeletal: Negative for back pain.  Neurological: Negative for headaches.  Hematological: Does not bruise/bleed easily.  Psychiatric/Behavioral: Negative for confusion.  All other systems reviewed and are negative.    Allergies  Review of patient's allergies indicates no known allergies.  Home Medications   Current Outpatient Rx  Name Route Sig Dispense Refill  . CALCIUM ACETATE 667 MG PO CAPS Oral Take 1,334-2,001 mg by mouth 3 (three) times daily with meals. Take 3 capsules with each of 3 daily meals, and 2 capsules with each of 2 daily snacks.    Marland Kitchen CINACALCET HCL 60 MG PO TABS Oral Take 60 mg by mouth at bedtime.     Marland Kitchen EZETIMIBE 10 MG PO TABS Oral Take 10 mg by  mouth at bedtime.     Marland Kitchen GABAPENTIN 100 MG PO CAPS Oral Take 100 mg by mouth at bedtime.    Marland Kitchen LANTHANUM CARBONATE 500 MG PO CHEW Oral Chew 2,000 mg by mouth 3 (three) times daily with meals.    . OXYCODONE HCL 5 MG PO TABS Oral Take 5 mg by mouth every 6 (six) hours as needed. For pain      BP 131/70  Pulse 90  Temp 98.2 F (36.8 C) (Oral)  Resp 18  SpO2 100%  Physical Exam  Nursing note and vitals reviewed. Constitutional: He is oriented to person, place, and time. He appears well-developed and well-nourished. No distress.       Uncomfortable appearing  HENT:  Head: Normocephalic and atraumatic.  Eyes: Conjunctivae normal and EOM are  normal.  Neck: Normal range of motion. Neck supple.  Cardiovascular: Normal rate, regular rhythm and intact distal pulses.   No murmur heard. Pulmonary/Chest: Breath sounds normal. No respiratory distress. He exhibits tenderness.  Abdominal: Soft. Bowel sounds are normal. He exhibits no distension. There is tenderness.       Tenderness in the left lower quadrant with no peritoneal signs  Musculoskeletal: Normal range of motion.  Neurological: He is alert and oriented to person, place, and time.  Skin: Skin is warm and dry.  Psychiatric: He has a normal mood and affect. Thought content normal.    ED Course  Procedures (including critical care time)  Labs Reviewed  CBC - Abnormal; Notable for the following:    WBC 12.7 (*)     RBC 3.90 (*)     Hemoglobin 12.0 (*)     HCT 36.2 (*)     All other components within normal limits  POCT I-STAT TROPONIN I  BASIC METABOLIC PANEL  LACTIC ACID, PLASMA  TROPONIN I   No results found.   No diagnosis found.   Date: 12/16/2011  Rate: 93  Rhythm: normal sinus rhythm  QRS Axis: normal  Intervals: normal  ST/T Wave abnormalities: normal  Conduction Disutrbances: none  Narrative Interpretation: unremarkable  7:20 PM Still in pain   MDM  46 , male, with abdominal pain, and vomiting.  For the past 3 days after eating food.  Now.  He also has chest pain, and shortness of breath, which began today.  He has end-stage renal disease, and hypertension.  He does not smoke or have history of prior myocardial infarction.  I doubt that his symptoms are related to ACS.  We'll perform a chest x-ray, laboratory testing, and an abdominal CT because of his lower abdominal tenderness.  I will do an EKG, and one, troponin for evaluation of his chest.  Pain.        Cheri Guppy, MD 12/16/11 1653  Cheri Guppy, MD 12/16/11 1920  Cheri Guppy, MD 12/16/11 2215

## 2011-12-16 NOTE — ED Notes (Signed)
Paged IV team for IV start.

## 2011-12-16 NOTE — ED Notes (Signed)
Spoke to [place] with IV team and he will be here as soon as possible.  Advised pt that as soon as IV is established meds will be given.

## 2011-12-16 NOTE — ED Notes (Signed)
Pt states that it "is time for his son to take over as the head of the house that it is his time to go". Pt when first in the room did not know who he is or where he was at. Pt said that he wants to go home now he is all better because now he is saying that he knows where he is and he knows his family, pt said that he "needs to go home and feed his dog.". Pt is now saying that he has lost his family.

## 2011-12-16 NOTE — ED Notes (Signed)
Pt c/o midsternal CP that started x 3 days ago; pt sts started with N/V and now having pain; pt appears lethargic; pt dialysis pt with last dialysis on Friday

## 2011-12-16 NOTE — ED Notes (Signed)
Advised pt that since nausea med is on board he needs to drink contrast so CT can be completed.  Pt verbalized understanding.

## 2012-01-03 ENCOUNTER — Emergency Department (HOSPITAL_COMMUNITY)
Admission: EM | Admit: 2012-01-03 | Discharge: 2012-01-03 | Disposition: A | Discharge status: Home / Self Care | Payer: 59 | Attending: Emergency Medicine | Admitting: Emergency Medicine

## 2012-01-03 ENCOUNTER — Encounter (HOSPITAL_COMMUNITY): Payer: Self-pay | Admitting: Emergency Medicine

## 2012-01-03 ENCOUNTER — Emergency Department (HOSPITAL_COMMUNITY): Payer: 59

## 2012-01-03 DIAGNOSIS — R109 Unspecified abdominal pain: Secondary | ICD-10-CM | POA: Insufficient documentation

## 2012-01-03 DIAGNOSIS — Z79899 Other long term (current) drug therapy: Secondary | ICD-10-CM | POA: Insufficient documentation

## 2012-01-03 DIAGNOSIS — Z992 Dependence on renal dialysis: Secondary | ICD-10-CM | POA: Insufficient documentation

## 2012-01-03 DIAGNOSIS — E079 Disorder of thyroid, unspecified: Secondary | ICD-10-CM | POA: Insufficient documentation

## 2012-01-03 DIAGNOSIS — Z9884 Bariatric surgery status: Secondary | ICD-10-CM | POA: Insufficient documentation

## 2012-01-03 DIAGNOSIS — N189 Chronic kidney disease, unspecified: Secondary | ICD-10-CM | POA: Insufficient documentation

## 2012-01-03 DIAGNOSIS — E119 Type 2 diabetes mellitus without complications: Secondary | ICD-10-CM | POA: Insufficient documentation

## 2012-01-03 DIAGNOSIS — Z8669 Personal history of other diseases of the nervous system and sense organs: Secondary | ICD-10-CM | POA: Insufficient documentation

## 2012-01-03 DIAGNOSIS — E785 Hyperlipidemia, unspecified: Secondary | ICD-10-CM | POA: Insufficient documentation

## 2012-01-03 LAB — CBC
Hemoglobin: 14.2 g/dL (ref 13.0–17.0)
MCH: 31.5 pg (ref 26.0–34.0)
MCHC: 32.7 g/dL (ref 30.0–36.0)
MCV: 96.2 fL (ref 78.0–100.0)
Platelets: 188 10*3/uL (ref 150–400)
RBC: 4.51 MIL/uL (ref 4.22–5.81)

## 2012-01-03 LAB — COMPREHENSIVE METABOLIC PANEL
CO2: 27 mEq/L (ref 19–32)
Calcium: 10.7 mg/dL — ABNORMAL HIGH (ref 8.4–10.5)
Creatinine, Ser: 8.47 mg/dL — ABNORMAL HIGH (ref 0.50–1.35)
GFR calc Af Amer: 8 mL/min — ABNORMAL LOW (ref >90)
GFR calc non Af Amer: 7 mL/min — ABNORMAL LOW (ref >90)
Glucose, Bld: 113 mg/dL — ABNORMAL HIGH (ref 70–99)
Total Protein: 9.4 g/dL — ABNORMAL HIGH (ref 6.0–8.3)

## 2012-01-03 MED ORDER — HYDROMORPHONE HCL PF 1 MG/ML IJ SOLN
1.0000 mg | Freq: Once | INTRAMUSCULAR | Status: AC
  Administered 2012-01-03: 1 mg via INTRAVENOUS
  Filled 2012-01-03: qty 1

## 2012-01-03 MED ORDER — ONDANSETRON HCL 4 MG/2ML IJ SOLN
4.0000 mg | Freq: Once | INTRAMUSCULAR | Status: AC
  Administered 2012-01-03: 4 mg via INTRAVENOUS
  Filled 2012-01-03: qty 2

## 2012-01-03 MED ORDER — MORPHINE SULFATE 4 MG/ML IJ SOLN
4.0000 mg | Freq: Once | INTRAMUSCULAR | Status: AC
  Administered 2012-01-03: 4 mg via INTRAVENOUS
  Filled 2012-01-03: qty 1

## 2012-01-03 MED ORDER — IOHEXOL 300 MG/ML  SOLN
20.0000 mL | INTRAMUSCULAR | Status: AC
  Administered 2012-01-03 (×2): 20 mL via ORAL

## 2012-01-03 MED ORDER — IOHEXOL 300 MG/ML  SOLN
100.0000 mL | Freq: Once | INTRAMUSCULAR | Status: AC | PRN
  Administered 2012-01-03: 100 mL via INTRAVENOUS

## 2012-01-03 MED ORDER — HYDROCODONE-ACETAMINOPHEN 5-500 MG PO TABS: 1.0000 | ORAL_TABLET | Freq: Four times a day (QID) | ORAL | Status: DC | PRN

## 2012-01-03 NOTE — ED Provider Notes (Signed)
History     CSN: [id number]  Arrival date & time 01/03/12  0218   First MD Initiated Contact with Patient 01/03/12 [phone]      Chief Complaint  Patient presents with  . Abdominal Pain    (Consider location/radiation/quality/duration/timing/severity/associated sxs/prior treatment) Patient is a 46 y.o. male presenting with abdominal pain. The history is provided by the patient.  Abdominal Pain The primary symptoms of the illness include abdominal pain and nausea. The current episode started 2 days ago. The onset of the illness was gradual. The problem has not changed since onset. The patient states that she believes she is currently not pregnant. The patient has not had a change in bowel habit.    Past Medical History  Diagnosis Date  . Chronic kidney disease     kidney failure  . Nausea   . Vomiting   . Stomach cramps   . Fever and chills   . Hyperlipidemia   . Thyroid disorder     takes sensipar  . Sleep apnea     "does not have to use cpap anymore"  . Diabetes mellitus     controlling by weight/diet    Past Surgical History  Procedure Date  . Gastric restriction surgery 12/06/09    lap band  . Dialysis fistula creation 2011  . Insertion of dialysis catheter 11/13/2011    Procedure: INSERTION OF DIALYSIS CATHETER;  Surgeon: Sherren Kerns, MD;  Location: [facility] OR;  Service: Vascular;  Laterality: Right;  Insertion of 23cm dialysis catheter in right IJ  . Av fistula placement 12/05/2011    Procedure: ARTERIOVENOUS (AV) FISTULA CREATION;  Surgeon: Larina Earthly, MD;  Location: [facility] OR;  Service: Vascular;  Laterality: Left;    Family History  Problem Relation Age of Onset  . Hypertension Mother   . Diabetes Mother   . Hypertension Father   . Diabetes Father     History  Substance Use Topics  . Smoking status: Never Smoker   . Smokeless tobacco: Never Used  . Alcohol Use: No      Review of Systems  Gastrointestinal: Positive for nausea and abdominal pain.  All  other systems reviewed and are negative.    Allergies  Review of patient's allergies indicates no known allergies.  Home Medications   Current Outpatient Rx  Name Route Sig Dispense Refill  . CALCIUM ACETATE 667 MG PO CAPS Oral Take 1,334-2,001 mg by mouth 3 (three) times daily with meals. Take 3 capsules with each of 3 daily meals, and 2 capsules with each of 2 daily snacks.    Marland Kitchen CINACALCET HCL 60 MG PO TABS Oral Take 60 mg by mouth at bedtime.     Marland Kitchen EZETIMIBE 10 MG PO TABS Oral Take 10 mg by mouth at bedtime.     Marland Kitchen GABAPENTIN 100 MG PO CAPS Oral Take 100 mg by mouth at bedtime.    Marland Kitchen LANTHANUM CARBONATE 500 MG PO CHEW Oral Chew 2,000 mg by mouth 3 (three) times daily with meals.    . OXYCODONE HCL 5 MG PO TABS Oral Take 5 mg by mouth every 6 (six) hours as needed. For pain    . OXYCODONE-ACETAMINOPHEN 5-325 MG PO TABS Oral Take 2 tablets by mouth every 4 (four) hours as needed for pain. 10 tablet 0    BP 172/132  Pulse 85  Temp 98.5 F (36.9 C) (Oral)  Resp 14  SpO2 98%  Physical Exam  Constitutional: He is oriented to person, place,  and time.       Chronically ill appearing  HENT:  Head: Normocephalic and atraumatic.  Eyes: Conjunctivae normal are normal. Pupils are equal, round, and reactive to light.  Neck: Normal range of motion. Neck supple.  Cardiovascular: Normal rate, regular rhythm, normal heart sounds and intact distal pulses.   Pulmonary/Chest: Effort normal and breath sounds normal.  Abdominal: Soft. Bowel sounds are normal. There is tenderness.    Neurological: He is alert and oriented to person, place, and time.  Skin: Skin is warm and dry.  Psychiatric: He has a normal mood and affect. His behavior is normal. Judgment and thought content normal.    ED Course  Procedures (including critical care time)   Labs Reviewed  CBC  COMPREHENSIVE METABOLIC PANEL  TROPONIN I   No results found.   No diagnosis found.    MDM  Pain improved. Ct neg.  Labs  benign.  Will dc to [facility] outpt,  Ret new/worsening sxs        Bellany Elbaum Lytle Michaels, MD 01/03/12 [phone]

## 2012-01-03 NOTE — ED Notes (Signed)
PT. REPORTS LEFT ABDOMINAL PAIN WITH NAUSEA AND VOMITTING ONSET 2 DAYS AGO , HEMODIALYSIS Q MON/WED/FRI.

## 2012-01-03 NOTE — ED Notes (Signed)
Family at bedside. 

## 2012-01-03 NOTE — ED Notes (Signed)
Assumed patient care @ 0327. Report received from [place], [place]

## 2012-01-07 ENCOUNTER — Encounter: Payer: Self-pay | Admitting: Vascular Surgery

## 2012-01-08 ENCOUNTER — Ambulatory Visit (INDEPENDENT_AMBULATORY_CARE_PROVIDER_SITE_OTHER): Payer: 59 | Admitting: Vascular Surgery

## 2012-01-08 ENCOUNTER — Encounter: Payer: Self-pay | Admitting: Vascular Surgery

## 2012-01-08 VITALS — BP 124/85 | HR 99 | Resp 20 | Ht 71.0 in | Wt 271.0 lb

## 2012-01-08 DIAGNOSIS — N186 End stage renal disease: Secondary | ICD-10-CM

## 2012-01-08 NOTE — Progress Notes (Signed)
The patient presents today for followup of creation of left upper arm AV fistula by myself on 12/05/2011. He has completely healed his antecubital wound. He did have a forearm fistula created in the past and has failed to mature. On physical exam he does have more dilated tributary veins over his left arm. He does have an excellent thrill and excellent size of the cephalic vein from the antecubital space forward. I imaged this with SonoSite ultrasound. This does show very good size maturation of the course of the vein. The vein does run rather deep. I explained to the patient that this is certainly marginal for being able be reliably access do to depth. He wishes to proceed at this time with mobilizing the vein to a more superficial location and ligation of side branches at the same setting. He dialyzes on Monday Wednesday and Friday and wishes to have surgery on Thursday from her work schedule standpoint. I explained that I do not do surgery on Tuesday source Thursdays. I we have scheduled this with Dr. Edilia Bo on November 7 as an outpatient at the hospital

## 2012-01-09 ENCOUNTER — Other Ambulatory Visit: Payer: Self-pay

## 2012-01-14 ENCOUNTER — Encounter (HOSPITAL_COMMUNITY): Payer: Self-pay | Admitting: Emergency Medicine

## 2012-01-14 ENCOUNTER — Emergency Department (HOSPITAL_COMMUNITY): Payer: Medicare Other

## 2012-01-14 ENCOUNTER — Inpatient Hospital Stay (HOSPITAL_COMMUNITY)
Admission: EM | Admit: 2012-01-14 | Discharge: 2012-01-17 | DRG: 391 | Disposition: A | Discharge status: Home / Self Care | Payer: Medicare Other | Attending: Internal Medicine | Admitting: Internal Medicine

## 2012-01-14 DIAGNOSIS — Z9884 Bariatric surgery status: Secondary | ICD-10-CM

## 2012-01-14 DIAGNOSIS — R109 Unspecified abdominal pain: Secondary | ICD-10-CM | POA: Diagnosis present

## 2012-01-14 DIAGNOSIS — E119 Type 2 diabetes mellitus without complications: Secondary | ICD-10-CM | POA: Diagnosis present

## 2012-01-14 DIAGNOSIS — N2581 Secondary hyperparathyroidism of renal origin: Secondary | ICD-10-CM | POA: Diagnosis present

## 2012-01-14 DIAGNOSIS — R112 Nausea with vomiting, unspecified: Principal | ICD-10-CM | POA: Diagnosis present

## 2012-01-14 DIAGNOSIS — I120 Hypertensive chronic kidney disease with stage 5 chronic kidney disease or end stage renal disease: Secondary | ICD-10-CM | POA: Diagnosis present

## 2012-01-14 DIAGNOSIS — N186 End stage renal disease: Secondary | ICD-10-CM | POA: Diagnosis present

## 2012-01-14 DIAGNOSIS — Z79899 Other long term (current) drug therapy: Secondary | ICD-10-CM

## 2012-01-14 DIAGNOSIS — E785 Hyperlipidemia, unspecified: Secondary | ICD-10-CM | POA: Diagnosis present

## 2012-01-14 DIAGNOSIS — I10 Essential (primary) hypertension: Secondary | ICD-10-CM | POA: Diagnosis present

## 2012-01-14 DIAGNOSIS — E079 Disorder of thyroid, unspecified: Secondary | ICD-10-CM | POA: Diagnosis present

## 2012-01-14 DIAGNOSIS — R1110 Vomiting, unspecified: Secondary | ICD-10-CM | POA: Diagnosis present

## 2012-01-14 DIAGNOSIS — G4730 Sleep apnea, unspecified: Secondary | ICD-10-CM | POA: Diagnosis present

## 2012-01-14 DIAGNOSIS — Z992 Dependence on renal dialysis: Secondary | ICD-10-CM

## 2012-01-14 HISTORY — DX: Bariatric surgery status: Z98.84

## 2012-01-14 LAB — BASIC METABOLIC PANEL
CO2: 23 mEq/L (ref 19–32)
Chloride: 99 mEq/L (ref 96–112)
GFR calc non Af Amer: 6 mL/min — ABNORMAL LOW (ref >90)
Glucose, Bld: 150 mg/dL — ABNORMAL HIGH (ref 70–99)
Potassium: 4.3 mEq/L (ref 3.5–5.1)
Sodium: 139 mEq/L (ref 135–145)

## 2012-01-14 LAB — CBC WITH DIFFERENTIAL/PLATELET
Hemoglobin: 12.4 g/dL — ABNORMAL LOW (ref 13.0–17.0)
Lymphocytes Relative: 8 % — ABNORMAL LOW (ref 12–46)
Lymphs Abs: 1 10*3/uL (ref 0.7–4.0)
Neutro Abs: 10.2 10*3/uL — ABNORMAL HIGH (ref 1.7–7.7)
Neutrophils Relative %: 89 % — ABNORMAL HIGH (ref 43–77)
Platelets: 164 10*3/uL (ref 150–400)
RBC: 4.06 MIL/uL — ABNORMAL LOW (ref 4.22–5.81)
WBC: 11.5 10*3/uL — ABNORMAL HIGH (ref 4.0–10.5)

## 2012-01-14 MED ORDER — MORPHINE SULFATE 4 MG/ML IJ SOLN
1.0000 mg | INTRAMUSCULAR | Status: DC | PRN
  Administered 2012-01-14 – 2012-01-15 (×2): 2 mg via INTRAVENOUS
  Filled 2012-01-14 (×2): qty 1

## 2012-01-14 MED ORDER — EZETIMIBE 10 MG PO TABS
10.0000 mg | ORAL_TABLET | Freq: Every day | ORAL | Status: DC
  Administered 2012-01-15 – 2012-01-17 (×3): 10 mg via ORAL
  Filled 2012-01-14 (×5): qty 1

## 2012-01-14 MED ORDER — FENTANYL CITRATE 0.05 MG/ML IJ SOLN: 100.0000 ug | INTRAMUSCULAR | Status: DC | PRN

## 2012-01-14 MED ORDER — FENTANYL CITRATE 0.05 MG/ML IJ SOLN
200.0000 ug | Freq: Once | INTRAMUSCULAR | Status: AC
  Administered 2012-01-14: 200 ug via INTRAVENOUS
  Filled 2012-01-14: qty 4

## 2012-01-14 MED ORDER — ONDANSETRON HCL 4 MG PO TABS
4.0000 mg | ORAL_TABLET | Freq: Four times a day (QID) | ORAL | Status: DC | PRN
  Administered 2012-01-17: 4 mg via ORAL
  Filled 2012-01-14: qty 0.5
  Filled 2012-01-14: qty 1

## 2012-01-14 MED ORDER — PANTOPRAZOLE SODIUM 40 MG IV SOLR
40.0000 mg | INTRAVENOUS | Status: DC
  Administered 2012-01-15 – 2012-01-17 (×3): 40 mg via INTRAVENOUS
  Filled 2012-01-14 (×4): qty 40

## 2012-01-14 MED ORDER — METOPROLOL TARTRATE 25 MG PO TABS
25.0000 mg | ORAL_TABLET | Freq: Two times a day (BID) | ORAL | Status: DC
  Administered 2012-01-15 (×3): 25 mg via ORAL
  Filled 2012-01-14 (×5): qty 1

## 2012-01-14 MED ORDER — ACETAMINOPHEN 325 MG PO TABS: 650.0000 mg | ORAL_TABLET | Freq: Four times a day (QID) | ORAL | Status: DC | PRN

## 2012-01-14 MED ORDER — GABAPENTIN 100 MG PO CAPS
100.0000 mg | ORAL_CAPSULE | Freq: Every day | ORAL | Status: DC
  Administered 2012-01-15 – 2012-01-16 (×3): 100 mg via ORAL
  Filled 2012-01-14 (×4): qty 1

## 2012-01-14 MED ORDER — SODIUM CHLORIDE 0.9 % IV SOLN: INTRAVENOUS | Status: DC

## 2012-01-14 MED ORDER — ACETAMINOPHEN 650 MG RE SUPP: 650.0000 mg | Freq: Four times a day (QID) | RECTAL | Status: DC | PRN

## 2012-01-14 MED ORDER — ONDANSETRON HCL 4 MG/2ML IJ SOLN
4.0000 mg | Freq: Once | INTRAMUSCULAR | Status: AC
  Administered 2012-01-14: 4 mg via INTRAVENOUS
  Filled 2012-01-14: qty 2

## 2012-01-14 MED ORDER — CINACALCET HCL 30 MG PO TABS
60.0000 mg | ORAL_TABLET | Freq: Every day | ORAL | Status: DC
  Administered 2012-01-15 – 2012-01-16 (×2): 60 mg via ORAL
  Filled 2012-01-14 (×4): qty 2

## 2012-01-14 MED ORDER — OXYCODONE-ACETAMINOPHEN 5-325 MG PO TABS
1.0000 | ORAL_TABLET | Freq: Once | ORAL | Status: AC
  Administered 2012-01-14: 1 via ORAL
  Filled 2012-01-14: qty 1

## 2012-01-14 MED ORDER — ONDANSETRON HCL 4 MG/2ML IJ SOLN: 4.0000 mg | INTRAMUSCULAR | Status: DC | PRN

## 2012-01-14 MED ORDER — CALCIUM ACETATE 667 MG PO CAPS
1334.0000 mg | ORAL_CAPSULE | Freq: Three times a day (TID) | ORAL | Status: DC
  Filled 2012-01-14: qty 3

## 2012-01-14 MED ORDER — OXYCODONE HCL 5 MG PO TABS
5.0000 mg | ORAL_TABLET | Freq: Four times a day (QID) | ORAL | Status: DC | PRN
  Administered 2012-01-15 – 2012-01-17 (×3): 5 mg via ORAL
  Filled 2012-01-14 (×3): qty 1

## 2012-01-14 MED ORDER — LANTHANUM CARBONATE 500 MG PO CHEW
2000.0000 mg | CHEWABLE_TABLET | Freq: Three times a day (TID) | ORAL | Status: DC
  Administered 2012-01-15 – 2012-01-17 (×4): 2000 mg via ORAL
  Filled 2012-01-14 (×10): qty 4

## 2012-01-14 MED ORDER — ENOXAPARIN SODIUM 40 MG/0.4ML ~~LOC~~ SOLN: 40.0000 mg | SUBCUTANEOUS | Status: DC

## 2012-01-14 MED ORDER — ONDANSETRON HCL 4 MG/2ML IJ SOLN
4.0000 mg | Freq: Four times a day (QID) | INTRAMUSCULAR | Status: DC | PRN
  Administered 2012-01-15 – 2012-01-17 (×2): 4 mg via INTRAVENOUS
  Filled 2012-01-14 (×2): qty 2

## 2012-01-14 NOTE — ED Notes (Addendum)
Per EMS: pt c/o mid abd pain with N/V starting last night; pt went to dialysis today and only did half treatment and went home; pt seen here two weeks ago for same; pt given 4mg  zofran IM without relief

## 2012-01-14 NOTE — ED Provider Notes (Signed)
History     CSN: [id number]  Arrival date & time 01/14/12  1522   First MD Initiated Contact with Patient 01/14/12 1636      Chief Complaint  Patient presents with  . Emesis  . Abdominal Pain    (Consider location/radiation/quality/duration/timing/severity/associated sxs/prior treatment) HPI Comments: Randall [name] is a 46 y.o. Male who developed abdominal pain, with nausea and vomiting yesterday. He was unable to eat anything since then. He continues to vomit and have pain. He went to dialysis, but could not complete the treatment because of the discomfort. He was sent here without treatment. He recently saw his vascular Dr. And is scheduled for revision of his left arm AV fistula. He is status post lap banding for obesity. He denies fever, or chills, cough, chest pain, weakness or dizziness. No alleviating factors.  Patient is a 46 y.o. male presenting with vomiting and abdominal pain. The history is provided by the patient.  Emesis  Associated symptoms include abdominal pain.  Abdominal Pain The primary symptoms of the illness include abdominal pain and vomiting.    Past Medical History  Diagnosis Date  . Chronic kidney disease     kidney failure  . Nausea   . Vomiting   . Stomach cramps   . Fever and chills   . Hyperlipidemia   . Thyroid disorder     takes sensipar  . Diabetes mellitus     controlling by weight/diet  . Lapband APL over bypass Sept 2011 05/24/2011    Past Surgical History  Procedure Date  . Gastric restriction surgery 12/06/09    lap band  . Dialysis fistula creation 2011  . Insertion of dialysis catheter 11/13/2011    Procedure: INSERTION OF DIALYSIS CATHETER;  Surgeon: Sherren Kerns, MD;  Location: [facility] OR;  Service: Vascular;  Laterality: Right;  Insertion of 23cm dialysis catheter in right IJ  . Av fistula placement 12/05/2011    Procedure: ARTERIOVENOUS (AV) FISTULA CREATION;  Surgeon: Larina Earthly, MD;  Location: [facility] OR;  Service: Vascular;   Laterality: Left;    Family History  Problem Relation Age of Onset  . Hypertension Mother   . Diabetes Mother   . Hypertension Father   . Diabetes Father     History  Substance Use Topics  . Smoking status: Never Smoker   . Smokeless tobacco: Never Used  . Alcohol Use: No      Review of Systems  Gastrointestinal: Positive for vomiting and abdominal pain.  All other systems reviewed and are negative.    Allergies  Review of patient's allergies indicates no known allergies.  Home Medications   No current outpatient prescriptions on file.  BP 101/65  Pulse 64  Temp 98.4 F (36.9 C) (Oral)  Resp 18  Ht 5\' 11"  (1.803 m)  Wt 261 lb 14.5 oz (118.8 kg)  BMI 36.53 kg/m2  SpO2 99%  Physical Exam  Nursing note and vitals reviewed. Constitutional: He is oriented to person, place, and time. He appears well-developed and well-nourished.  HENT:  Head: Normocephalic and atraumatic.  Right Ear: External ear normal.  Left Ear: External ear normal.  Eyes: Conjunctivae normal and EOM are normal. Pupils are equal, round, and reactive to light.  Neck: Normal range of motion and phonation normal. Neck supple.  Cardiovascular: Normal rate, regular rhythm, normal heart sounds and intact distal pulses.   Pulmonary/Chest: Effort normal and breath sounds normal. He exhibits no bony tenderness.  Abdominal: Soft. Normal appearance and bowel  sounds are normal. He exhibits no mass. There is tenderness (Mild upper chest). There is no guarding.       Lap band is palpable in the right upper quadrant, the area is nontender  Musculoskeletal: Normal range of motion.  Neurological: He is alert and oriented to person, place, and time. He has normal strength. No cranial nerve deficit or sensory deficit. He exhibits normal muscle tone. Coordination normal.  Skin: Skin is warm, dry and intact.  Psychiatric: He has a normal mood and affect. His behavior is normal. Judgment and thought content normal.      ED Course  Procedures (including critical care time)  Labs Reviewed  CBC WITH DIFFERENTIAL - Abnormal; Notable for the following:    WBC 11.5 (*)     RBC 4.06 (*)     Hemoglobin 12.4 (*)     HCT 38.5 (*)     Neutrophils Relative 89 (*)     Neutro Abs 10.2 (*)     Lymphocytes Relative 8 (*)     All other components within normal limits  BASIC METABOLIC PANEL - Abnormal; Notable for the following:    Glucose, Bld 150 (*)     Creatinine, Ser 9.87 (*)     GFR calc non Af Amer 6 (*)     GFR calc Af Amer 6 (*)     All other components within normal limits  CBC - Abnormal; Notable for the following:    WBC 13.6 (*)  WHITE COUNT CONFIRMED ON SMEAR   RBC 3.84 (*)     Hemoglobin 12.1 (*)     HCT 36.7 (*)     All other components within normal limits  COMPREHENSIVE METABOLIC PANEL - Abnormal; Notable for the following:    Potassium 5.4 (*)     BUN 31 (*)     Creatinine, Ser 11.72 (*)     GFR calc non Af Amer 5 (*)     GFR calc Af Amer 5 (*)     All other components within normal limits  LIPASE, BLOOD   Dg Abd Acute W/chest  01/14/2012  *RADIOLOGY REPORT*  Clinical Data: Nausea and vomiting since yesterday.  Right-sided abdominal pain.  History of diabetes end-stage renal disease. Prior laparoscopic band gastric surgery.  ACUTE ABDOMEN SERIES (ABDOMEN 2 VIEW & CHEST 1 VIEW)  Comparison: CT abdomen and pelvis 01/03/2012, 12/16/2011.  Portable chest x-ray 11/13/2011.  Findings: Bowel gas pattern unremarkable without evidence of obstruction or significant ileus.  No evidence of free air or air- fluid levels on the lateral decubitus image.  Laparoscopic band appropriately oriented from 2 o'clock to 8 o'clock.  No visible opaque urinary tract calculi.  Right jugular dialysis catheter tips in the lower SVC and upper right atrium.  Cardiomediastinal silhouette unremarkable.  Lungs clear.  Pulmonary vascularity normal.  No visible pleural effusions.  IMPRESSION: No acute abdominal or pulmonary  abnormality.   Original Report Authenticated By: Hulan Saas, M.D.      1. Abdominal pain   2. Nausea and vomiting   3. Vomiting   4. ESRD (end stage renal disease) on dialysis   5. History of laparoscopic adjustable gastric banding   6. Hypertension       MDM  Nonspecific recurrent abdominal pain, with nausea and vomiting. Patient is using narcotics, and has run out of them.  The differential diagnosis for this includes narcotic withdrawal syndrome. Doubt daily. Intra-abdominal infection, obstruction, infectious enteritis. He does not appear fluid overloaded, and the  potassium is normal. He is not in need of urgent dialysis. Patient remained uncomfortable despite multiple doses of narcotics in the emergency department. He'll therefore be admitted for evaluation and treatment.    Plan: Admit- Hospitalist    Flint Melter, MD 01/15/12 [phone]

## 2012-01-14 NOTE — ED Notes (Signed)
Pt given water and asked to drink some of it to see how he can tolerate PO fluids.

## 2012-01-14 NOTE — ED Notes (Addendum)
Pt vomited/spitting saliva up.

## 2012-01-14 NOTE — ED Notes (Signed)
Pt c/o upper left abd pain x 2 days today n/v, pt was at dialysis today when he became very nauseous and unable to finish, reports he got done with half of it today.

## 2012-01-14 NOTE — ED Notes (Signed)
Pt with hx of lap band 2 years ago

## 2012-01-14 NOTE — H&P (Signed)
[facility] History and Physical  Randall [name]:[id number] DOB: [birth year] DOA: 01/14/2012  Referring physician: Dr. Effie Shy PCP: [name]., MD  Specialists: Bariatric Surgeon: Dr. Daphine Deutscher Nephrologist: Dr. Caryn Section  Chief Complaint: vomiting  HPI: Randall [name] is a 46 y.o. male with history of end-stage renal disease on hemodialysis Monday Wednesday and Friday, history of gastric bypass surgery over 10 years ago, history of laparoscopic gastric banding approximately 2 years ago. Patient has had multiple visits to the emergency room the past month for vomiting and abdominal pain. He reports that the fluid in his gastric band was increased approximately 2 months ago. For the past month has been having significant upper abdominal pain, vomiting. His symptoms are worse with solid food. He can tolerate small amounts of liquids. He reports having some difficulty passing stool, but also says that he does his bowels every time he tries it. He felt that he may have been feverish this morning. He's not had any sick contacts, he has not been eating out at restaurants. He reports trying to be more compliant with his diet. He lost approximately 80-90 pounds since banding. Acute abdominal series in the emergency room was found to be unremarkable. Due to the persistence of his symptoms, the hospitalists were asked to assist with management..  Review of Systems: Pertinent positives as per history of present illness, otherwise negative  Past Medical History  Diagnosis Date  . Chronic kidney disease     kidney failure  . Nausea   . Vomiting   . Stomach cramps   . Fever and chills   . Hyperlipidemia   . Thyroid disorder     takes sensipar  . Sleep apnea     "does not have to use cpap anymore"  . Diabetes mellitus     controlling by weight/diet   Past Surgical History  Procedure Date  . Gastric restriction surgery 12/06/09    lap band  . Dialysis fistula creation 2011  . Insertion of  dialysis catheter 11/13/2011    Procedure: INSERTION OF DIALYSIS CATHETER;  Surgeon: Sherren Kerns, MD;  Location: [facility] OR;  Service: Vascular;  Laterality: Right;  Insertion of 23cm dialysis catheter in right IJ  . Av fistula placement 12/05/2011    Procedure: ARTERIOVENOUS (AV) FISTULA CREATION;  Surgeon: Larina Earthly, MD;  Location: [facility] OR;  Service: Vascular;  Laterality: Left;   Social History:  reports that he has never smoked. He has never used smokeless tobacco. He reports that he does not drink alcohol or use illicit drugs. Lives independently.  No Known Allergies  Family History  Problem Relation Age of Onset  . Hypertension Mother   . Diabetes Mother   . Hypertension Father   . Diabetes Father     Prior to Admission medications   Medication Sig Start Date End Date Taking? Authorizing Provider  calcium acetate (PHOSLO) 667 MG capsule Take 1,334-2,001 mg by mouth 3 (three) times daily with meals. Take 3 capsules with each of 3 daily meals, and 2 capsules with each of 2 daily snacks.   Yes Historical Provider, MD  cinacalcet (SENSIPAR) 60 MG tablet Take 60 mg by mouth at bedtime.    Yes Historical Provider, MD  ezetimibe (ZETIA) 10 MG tablet Take 10 mg by mouth at bedtime.    Yes Historical Provider, MD  gabapentin (NEURONTIN) 100 MG capsule Take 100 mg by mouth at bedtime.   Yes Historical Provider, MD  HYDROcodone-acetaminophen (VICODIN) 5-500 MG per tablet Take  1-2 tablets by mouth every 6 (six) hours as needed for pain. 01/03/12  Yes Chionesu Lytle Michaels, MD  lanthanum (FOSRENOL) 500 MG chewable tablet Chew 2,000 mg by mouth 3 (three) times daily with meals.   Yes Historical Provider, MD  oxyCODONE (OXY IR/ROXICODONE) 5 MG immediate release tablet Take 5 mg by mouth every 6 (six) hours as needed. For pain 12/05/11  Yes Ames Coupe Rhyne, PA  oxyCODONE-acetaminophen (PERCOCET) 5-325 MG per tablet Take 2 tablets by mouth every 4 (four) hours as needed for pain. 12/16/11  Yes Cheri Guppy, MD   Physical Exam: Filed Vitals:   01/14/12 1900 01/14/12 1930 01/14/12 2000 01/14/12 2030  BP: 182/93 188/109 183/98 183/97  Pulse: 85 85 87 108  Temp:      TempSrc:      Resp: 20     SpO2: 98% 100% 100% 98%     General:  Does not appear to be in any distress  Eyes: Pupils are equal round react to light  ENT: Mucous membranes are dry  Neck: Supple  Cardiovascular: S1, S2, regular rate and rhythm  Respiratory: Clear to auscultation bilaterally, right chest dialysis catheter  Abdomen: Soft, tender in the upper right and left quadrants, bowel sounds are active  Skin: Deferred  Musculoskeletal: Deferred  Psychiatric: Normal affect, cooperative with exam  Neurologic: Grossly intact, nonfocal  Labs on Admission:  Basic Metabolic Panel:  Lab 01/14/12 1610  NA 139  K 4.3  CL 99  CO2 23  GLUCOSE 150*  BUN 23  CREATININE 9.87*  CALCIUM 10.1  MG --  PHOS --   Liver Function Tests: No results found for this basename: AST:5,ALT:5,ALKPHOS:5,BILITOT:5,PROT:5,ALBUMIN:5 in the last 168 hours  Lab 01/14/12 1504  LIPASE 19  AMYLASE --   No results found for this basename: AMMONIA:5 in the last 168 hours CBC:  Lab 01/14/12 1504  WBC 11.5*  NEUTROABS 10.2*  HGB 12.4*  HCT 38.5*  MCV 94.8  PLT 164   Cardiac Enzymes: No results found for this basename: CKTOTAL:5,CKMB:5,CKMBINDEX:5,TROPONINI:5 in the last 168 hours  BNP (last 3 results) No results found for this basename: PROBNP:3 in the last 8760 hours CBG: No results found for this basename: GLUCAP:5 in the last 168 hours  Radiological Exams on Admission: Dg Abd Acute W/chest  01/14/2012  *RADIOLOGY REPORT*  Clinical Data: Nausea and vomiting since yesterday.  Right-sided abdominal pain.  History of diabetes end-stage renal disease. Prior laparoscopic band gastric surgery.  ACUTE ABDOMEN SERIES (ABDOMEN 2 VIEW & CHEST 1 VIEW)  Comparison: CT abdomen and pelvis 01/03/2012, 12/16/2011.  Portable  chest x-ray 11/13/2011.  Findings: Bowel gas pattern unremarkable without evidence of obstruction or significant ileus.  No evidence of free air or air- fluid levels on the lateral decubitus image.  Laparoscopic band appropriately oriented from 2 o'clock to 8 o'clock.  No visible opaque urinary tract calculi.  Right jugular dialysis catheter tips in the lower SVC and upper right atrium.  Cardiomediastinal silhouette unremarkable.  Lungs clear.  Pulmonary vascularity normal.  No visible pleural effusions.  IMPRESSION: No acute abdominal or pulmonary abnormality.   Original Report Authenticated By: Hulan Saas, M.D.     Assessment/Plan Active Problems:  ESRD (end stage renal disease) on dialysis  Lapband APL over bypass Sept 2011  Vomiting  Abdominal pain  Hypertension   1. Intractable vomiting and abdominal pain. Etiologies include gastritis, viral infection, problem with gastric band. Patient will be admitted to the hospital on observation. He'll be treated  supportively with antiemetics, proton pump inhibitors. We will give him clear liquids and this can be advanced as tolerated. I have spoken to general surgery, Dr. Harlon Flor. They will follow the patient in the morning. Patient may need a barium swallow to ensure that his band is nonobstructing. 2. End-stage renal disease on hemodialysis. I discussed the case with Dr. Kathrene Bongo, the nephrology service will follow in the morning 3. Hypertension. I started the patient on Lopressor, this can be adjusted as tolerated    Code Status: Full code Family Communication: Discussed with patient at bedside Disposition Plan: Depending on hospital course  Time spent:  [facility] Pager [phone]  If 7PM-7AM, please contact night-coverage [email or web address] Password [email or web address] 01/14/2012, 10:14 PM

## 2012-01-15 ENCOUNTER — Inpatient Hospital Stay (HOSPITAL_COMMUNITY): Payer: Medicare Other

## 2012-01-15 ENCOUNTER — Encounter (HOSPITAL_COMMUNITY): Payer: Self-pay | Admitting: Surgery

## 2012-01-15 DIAGNOSIS — R112 Nausea with vomiting, unspecified: Principal | ICD-10-CM

## 2012-01-15 DIAGNOSIS — R109 Unspecified abdominal pain: Secondary | ICD-10-CM

## 2012-01-15 DIAGNOSIS — R110 Nausea: Secondary | ICD-10-CM

## 2012-01-15 DIAGNOSIS — I10 Essential (primary) hypertension: Secondary | ICD-10-CM

## 2012-01-15 LAB — COMPREHENSIVE METABOLIC PANEL
ALT: 18 U/L (ref 0–53)
Albumin: 3.5 g/dL (ref 3.5–5.2)
Alkaline Phosphatase: 73 U/L (ref 39–117)
Chloride: 99 mEq/L (ref 96–112)
GFR calc Af Amer: 5 mL/min — ABNORMAL LOW (ref >90)
Glucose, Bld: 97 mg/dL (ref 70–99)
Potassium: 5.4 mEq/L — ABNORMAL HIGH (ref 3.5–5.1)
Sodium: 138 mEq/L (ref 135–145)
Total Bilirubin: 0.4 mg/dL (ref 0.3–1.2)
Total Protein: 7.4 g/dL (ref 6.0–8.3)

## 2012-01-15 LAB — CBC
Hemoglobin: 12.1 g/dL — ABNORMAL LOW (ref 13.0–17.0)
MCHC: 33 g/dL (ref 30.0–36.0)
WBC: 13.6 10*3/uL — ABNORMAL HIGH (ref 4.0–10.5)

## 2012-01-15 MED ORDER — IOHEXOL 300 MG/ML  SOLN
100.0000 mL | Freq: Once | INTRAMUSCULAR | Status: AC | PRN
  Administered 2012-01-15: 100 mL via INTRAVENOUS

## 2012-01-15 MED ORDER — CALCIUM ACETATE 667 MG PO CAPS
1334.0000 mg | ORAL_CAPSULE | ORAL | Status: DC | PRN
  Filled 2012-01-15: qty 2

## 2012-01-15 MED ORDER — IOHEXOL 300 MG/ML  SOLN
20.0000 mL | INTRAMUSCULAR | Status: AC
  Administered 2012-01-15 (×2): 20 mL via ORAL

## 2012-01-15 MED ORDER — HEPARIN SODIUM (PORCINE) 1000 UNIT/ML DIALYSIS
100.0000 [IU]/kg | INTRAMUSCULAR | Status: DC | PRN
  Filled 2012-01-15: qty 12

## 2012-01-15 MED ORDER — PARICALCITOL 5 MCG/ML IV SOLN
18.0000 ug | INTRAVENOUS | Status: DC
  Administered 2012-01-16: 18 ug via INTRAVENOUS
  Filled 2012-01-15 (×2): qty 3.6

## 2012-01-15 MED ORDER — CALCIUM ACETATE 667 MG PO CAPS
2001.0000 mg | ORAL_CAPSULE | Freq: Three times a day (TID) | ORAL | Status: DC
  Administered 2012-01-15 – 2012-01-17 (×4): 2001 mg via ORAL
  Filled 2012-01-15 (×10): qty 3

## 2012-01-15 NOTE — Progress Notes (Signed)
Utilization review complete 

## 2012-01-15 NOTE — Progress Notes (Signed)
[facility] PROGRESS NOTE  Randall [name] WUJ:[id number] DOB: [birth year] DOA: 01/14/2012 PCP: [name]., MD HPI/Subjective: He feels okay, denies any nausea or vomiting now. About 3-4 in a.m. he had severe nausea with dry heaves   Assessment/Plan:  Intractable nausea and vomiting -An unclear etiology, could be secondary to transient gastroenteritis versus problems with his lap band. -He needs to keep his weight under certain level to qualify for a kidney transplant. -Per Dr. Daphine Deutscher, doesn't sound like a band problem. -Evaluation by CT scan of abdomen/pelvis with oral contrast for further evaluation. -He is on clear liquids, likely to advance after CT scan.  End-stage renal disease on hemodialysis -Nephrology was been notified, patient for routine dialysis.  Hypertension -Not on medication as outpatient. -Started on metoprolol, nephrology please advise if you have preference for antihypertensives.  Code Status: Full Family Communication:  Disposition Plan: Continue Observation status   Consultants:  Wenda Low, [facility].  Procedures:  None  Antibiotics:  None   Objective: Filed Vitals:   01/14/12 2000 01/14/12 2030 01/14/12 2309 01/15/12 0509  BP: 183/98 183/97 165/89 159/92  Pulse: 87 108 86 85  Temp:   98.8 F (37.1 C) 98.3 F (36.8 C)  TempSrc:   Oral Oral  Resp:   20 18  Height:   5\' 11"  (1.803 m)   Weight:   119.6 kg (263 lb 10.7 oz) 118.8 kg (261 lb 14.5 oz)  SpO2: 100% 98% 99% 99%   No intake or output data in the 24 hours ending 01/15/12 0950 Filed Weights   01/14/12 2309 01/15/12 0509  Weight: 119.6 kg (263 lb 10.7 oz) 118.8 kg (261 lb 14.5 oz)    Exam:  General: Alert and awake, oriented x3, not in any acute distress. HEENT: anicteric sclera, pupils reactive to light and accommodation, EOMI CVS: S1-S2 clear, no murmur rubs or gallops Chest: clear to auscultation bilaterally, no wheezing, rales or  rhonchi Abdomen: soft nontender, nondistended, normal bowel sounds, no organomegaly Extremities: no cyanosis, clubbing or edema noted bilaterally Neuro: Cranial nerves II-XII intact, no focal neurological deficits  Data Reviewed: Basic Metabolic Panel:  Lab 01/15/12 9562 01/14/12 1504  NA 138 139  K 5.4* 4.3  CL 99 99  CO2 25 23  GLUCOSE 97 150*  BUN 31* 23  CREATININE 11.72* 9.87*  CALCIUM 10.2 10.1  MG -- --  PHOS -- --   Liver Function Tests:  Lab 01/15/12 0630  AST 22  ALT 18  ALKPHOS 73  BILITOT 0.4  PROT 7.4  ALBUMIN 3.5    Lab 01/14/12 1504  LIPASE 19  AMYLASE --   No results found for this basename: AMMONIA:5 in the last 168 hours CBC:  Lab 01/15/12 0630 01/14/12 1504  WBC 13.6* 11.5*  NEUTROABS -- 10.2*  HGB 12.1* 12.4*  HCT 36.7* 38.5*  MCV 95.6 94.8  PLT PLATELET CLUMPS NOTED ON SMEAR, UNABLE TO ESTIMATE 164   Cardiac Enzymes: No results found for this basename: CKTOTAL:5,CKMB:5,CKMBINDEX:5,TROPONINI:5 in the last 168 hours BNP (last 3 results) No results found for this basename: PROBNP:3 in the last 8760 hours CBG: No results found for this basename: GLUCAP:5 in the last 168 hours  No results found for this or any previous visit (from the past 240 hour(s)).   Studies: Dg Abd Acute W/chest  01/14/2012  *RADIOLOGY REPORT*  Clinical Data: Nausea and vomiting since yesterday.  Right-sided abdominal pain.  History of diabetes end-stage renal disease. Prior laparoscopic band gastric surgery.  ACUTE  ABDOMEN SERIES (ABDOMEN 2 VIEW & CHEST 1 VIEW)  Comparison: CT abdomen and pelvis 01/03/2012, 12/16/2011.  Portable chest x-ray 11/13/2011.  Findings: Bowel gas pattern unremarkable without evidence of obstruction or significant ileus.  No evidence of free air or air- fluid levels on the lateral decubitus image.  Laparoscopic band appropriately oriented from 2 o'clock to 8 o'clock.  No visible opaque urinary tract calculi.  Right jugular dialysis catheter tips  in the lower SVC and upper right atrium.  Cardiomediastinal silhouette unremarkable.  Lungs clear.  Pulmonary vascularity normal.  No visible pleural effusions.  IMPRESSION: No acute abdominal or pulmonary abnormality.   Original Report Authenticated By: Hulan Saas, M.D.     Scheduled Meds:   . calcium acetate  2,001 mg Oral TID WC  . cinacalcet  60 mg Oral QHS  . ezetimibe  10 mg Oral QHS  . [COMPLETED] fentaNYL  200 mcg Intravenous Once  . [COMPLETED] fentaNYL  200 mcg Intravenous Once  . gabapentin  100 mg Oral QHS  . lanthanum  2,000 mg Oral TID WC  . metoprolol tartrate  25 mg Oral BID  . [COMPLETED] ondansetron (ZOFRAN) IV  4 mg Intravenous Once  . [COMPLETED] oxyCODONE-acetaminophen  1 tablet Oral Once  . pantoprazole (PROTONIX) IV  40 mg Intravenous Q24H  . [DISCONTINUED] sodium chloride   Intravenous STAT  . [DISCONTINUED] calcium acetate  1,334-2,001 mg Oral TID WC  . [DISCONTINUED] enoxaparin (LOVENOX) injection  40 mg Subcutaneous Q24H   Continuous Infusions:   Active Problems:  ESRD (end stage renal disease) on dialysis  Lapband APL over bypass Sept 2011  Vomiting  Abdominal pain  Hypertension    Time spent: 35 minutes    [facility]  [facility] Pager [phone]. If 8PM-8AM, please contact night-coverage at [email or web address], password [email or web address] 01/15/2012, 9:50 AM  LOS: 1 day

## 2012-01-15 NOTE — Consult Note (Signed)
[facility] Renal Consultation Note  Indication for Consultation:  Management of ESRD/hemodialysis; anemia, hypertension/volume and secondary hyperparathyroidism  HPI: Randall [name] is a 46 y.o. male with ESRD on dialysis on MWF at the [facility] who presented to the ED yesterday with intermittent postprandial lower abdominal pain and nausea, sometimes with vomiting, for the last two months.  He had gastric bypass surgery ten years ago and in 11/2009 lapband APL over bypass by Dr. Daphine Deutscher of [facility] to become active for renal transplant at [facility].  He was seen by Dr. Daphine Deutscher who suggested that the patient's symptoms could be related to an internal hernia with intermittent partial obstruction rather than a problem with the band.  He recommends a CT with oral contrast during an episode to look for swirling within the abdomen to suggest an internal hernia.  Dialysis Orders: Center: [facility] on MWF. EDW 120.5 kg   HD Bath 2K/2.5Ca  Time 4 hrs 15 mins   Heparin 10,000 U. Access right IJ catheter  BFR 450 DFR 800    Zemplar 18 mcg IV/HD   Epogen 0 Units IV/HD  Venofer 0.   Past Medical History  Diagnosis Date  . Chronic kidney disease     kidney failure  . Nausea   . Vomiting   . Stomach cramps   . Fever and chills   . Hyperlipidemia   . Thyroid disorder     takes sensipar  . Diabetes mellitus     controlling by weight/diet  . Lapband APL over bypass Sept 2011 05/24/2011   Past Surgical History  Procedure Date  . Gastric restriction surgery 12/06/09    lap band  . Dialysis fistula creation 2011  . Insertion of dialysis catheter 11/13/2011    Procedure: INSERTION OF DIALYSIS CATHETER;  Surgeon: Sherren Kerns, MD;  Location: [facility] OR;  Service: Vascular;  Laterality: Right;  Insertion of 23cm dialysis catheter in right IJ  . Av fistula placement 12/05/2011    Procedure: ARTERIOVENOUS (AV) FISTULA CREATION;  Surgeon: Larina Earthly, MD;  Location: [facility] OR;  Service: Vascular;   Laterality: Left;   Family History  Problem Relation Age of Onset  . Hypertension Mother   . Diabetes Mother   . Hypertension Father   . Diabetes Father    Social History He denies any history of tobacco, alcohol, or illicit drug use.  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  No Known Allergies Prior to Admission medications   Medication Sig Start Date End Date Taking? Authorizing Provider  calcium acetate (PHOSLO) 667 MG capsule Take 1,334-2,001 mg by mouth 3 (three) times daily with meals. Take 3 capsules with each of 3 daily meals, and 2 capsules with each of 2 daily snacks.   Yes Historical Provider, MD  cinacalcet (SENSIPAR) 60 MG tablet Take 60 mg by mouth at bedtime.    Yes Historical Provider, MD  ezetimibe (ZETIA) 10 MG tablet Take 10 mg by mouth at bedtime.    Yes Historical Provider, MD  gabapentin (NEURONTIN) 100 MG capsule Take 100 mg by mouth at bedtime.   Yes Historical Provider, MD  HYDROcodone-acetaminophen (VICODIN) 5-500 MG per tablet Take 1-2 tablets by mouth every 6 (six) hours as needed for pain. 01/03/12  Yes Chionesu Lytle Michaels, MD  lanthanum (FOSRENOL) 500 MG chewable tablet Chew 2,000 mg by mouth 3 (three) times daily with meals.   Yes Historical Provider, MD  oxyCODONE (OXY IR/ROXICODONE) 5 MG immediate release tablet Take  5 mg by mouth every 6 (six) hours as needed. For pain 12/05/11  Yes Ames Coupe Rhyne, PA  oxyCODONE-acetaminophen (PERCOCET) 5-325 MG per tablet Take 2 tablets by mouth every 4 (four) hours as needed for pain. 12/16/11  Yes Cheri Guppy, MD   Labs:  Results for orders placed during the hospital encounter of 01/14/12 (from the past 48 hour(s))  CBC WITH DIFFERENTIAL     Status: Abnormal   Collection Time   01/14/12  3:04 PM      Component Value Range Comment   WBC 11.5 (*) 4.0 - 10.5 K/uL    RBC 4.06 (*) 4.22 - 5.81 MIL/uL    Hemoglobin 12.4 (*) 13.0 - 17.0 g/dL    HCT 16.1 (*) 09.6 - 52.0 %    MCV 94.8  78.0 - 100.0 fL     MCH 30.5  26.0 - 34.0 pg    MCHC 32.2  30.0 - 36.0 g/dL    RDW 04.5  40.9 - 81.1 %    Platelets 164  150 - 400 K/uL    Neutrophils Relative 89 (*) 43 - 77 %    Neutro Abs 10.2 (*) 1.7 - 7.7 K/uL    Lymphocytes Relative 8 (*) 12 - 46 %    Lymphs Abs 1.0  0.7 - 4.0 K/uL    Monocytes Relative 3  3 - 12 %    Monocytes Absolute 0.3  0.1 - 1.0 K/uL    Eosinophils Relative 0  0 - 5 %    Eosinophils Absolute 0.0  0.0 - 0.7 K/uL    Basophils Relative 0  0 - 1 %    Basophils Absolute 0.0  0.0 - 0.1 K/uL   BASIC METABOLIC PANEL     Status: Abnormal   Collection Time   01/14/12  3:04 PM      Component Value Range Comment   Sodium 139  135 - 145 mEq/L    Potassium 4.3  3.5 - 5.1 mEq/L    Chloride 99  96 - 112 mEq/L    CO2 23  19 - 32 mEq/L    Glucose, Bld 150 (*) 70 - 99 mg/dL    BUN 23  6 - 23 mg/dL    Creatinine, Ser 9.14 (*) 0.50 - 1.35 mg/dL    Calcium 78.2  8.4 - 10.5 mg/dL    GFR calc non Af Amer 6 (*) >90 mL/min    GFR calc Af Amer 6 (*) >90 mL/min   LIPASE, BLOOD     Status: Normal   Collection Time   01/14/12  3:04 PM      Component Value Range Comment   Lipase 19  11 - 59 U/L   CBC     Status: Abnormal   Collection Time   01/15/12  6:30 AM      Component Value Range Comment   WBC 13.6 (*) 4.0 - 10.5 K/uL WHITE COUNT CONFIRMED ON SMEAR   RBC 3.84 (*) 4.22 - 5.81 MIL/uL    Hemoglobin 12.1 (*) 13.0 - 17.0 g/dL    HCT 95.6 (*) 21.3 - 52.0 %    MCV 95.6  78.0 - 100.0 fL    MCH 31.5  26.0 - 34.0 pg    MCHC 33.0  30.0 - 36.0 g/dL    RDW 08.6  57.8 - 46.9 %    Platelets PLATELET CLUMPS NOTED ON SMEAR, UNABLE TO ESTIMATE  150 - 400 K/uL   COMPREHENSIVE METABOLIC PANEL  Status: Abnormal   Collection Time   01/15/12  6:30 AM      Component Value Range Comment   Sodium 138  135 - 145 mEq/L    Potassium 5.4 (*) 3.5 - 5.1 mEq/L    Chloride 99  96 - 112 mEq/L    CO2 25  19 - 32 mEq/L    Glucose, Bld 97  70 - 99 mg/dL    BUN 31 (*) 6 - 23 mg/dL    Creatinine, Ser 40.98 (*) 0.50 -  1.35 mg/dL    Calcium 11.9  8.4 - 10.5 mg/dL    Total Protein 7.4  6.0 - 8.3 g/dL    Albumin 3.5  3.5 - 5.2 g/dL    AST 22  0 - 37 U/L    ALT 18  0 - 53 U/L    Alkaline Phosphatase 73  39 - 117 U/L    Total Bilirubin 0.4  0.3 - 1.2 mg/dL    GFR calc non Af Amer 5 (*) >90 mL/min    GFR calc Af Amer 5 (*) >90 mL/min    Constitutional: negative for chills, fatigue, fevers and sweats Ears, nose, mouth, throat, and face: negative for hearing loss, hoarseness, nasal congestion, sore mouth and sore throat Respiratory: negative for cough, dyspnea on exertion, hemoptysis, sputum and wheezing Cardiovascular: negative for chest pain, dyspnea, orthopnea and palpitations Gastrointestinal: positive for abdominal pain, nausea and dry heaves, negative for change in bowel habits Genitourinary:negative, oliguric Musculoskeletal:negative for arthralgias, back pain, myalgias and neck pain Neurological: negative for dizziness, gait problems, headaches, speech problems and weakness  Physical Exam: Filed Vitals:   01/15/12 1522  BP: 101/65  Pulse: 64  Temp: 98.4 F (36.9 C)  Resp: 18     General appearance: alert, cooperative and no distress Head: Normocephalic, without obvious abnormality, atraumatic Neck: no adenopathy, no carotid bruit, no JVD and supple, symmetrical, trachea midline Resp: clear to auscultation bilaterally Cardio: regular rate and rhythm, S1, S2 normal, no murmur, click, rub or gallop GI: + BS, soft with lower abdominal tenderness Extremities: extremities normal, atraumatic, no cyanosis or edema Neurologic: Grossly normal Dialysis Access: Right IJ catheter, also AVF @ [facility]   Assessment/Plan: 1. Abdominal pain - recurrent over last 2 months, especially postprandial; s/p lapband APL over bypass 11/2009 per Dr. Daphine Deutscher of [facility]; possibly related to an internal hernia per Dr. Daphine Deutscher.  Surgery following. 2. ESRD -  HD on MWF @ [facility]; K 5.4.  HD tomorrow. 3. Dialysis access - AVF @ [facility]  created 9/25 by Dr. Arbie Cookey, superficialization with ligation of side branches scheduled on 11/7 per Dr. Arbie Cookey. 4. Hypertension/volume  - BP 101/65, no outpatient meds, but on Metoprolol 25 mg bid; current wt 118.9 kg with EDW 120.5 kg. 5. Anemia  - Hgb 12.1, no outpatient Epogen or Fe. 6. Metabolic bone disease -  Ca 10.2, last P 8.6, last iPTH 368 on 10/16; on Zemplar 18 mcg, Sensipar 120 mg qd, Fosrenol 1000 mg 2 with meals. 7. Nutrition - Last Alb 4.1. 8. S/p Gastric bypass in 2000, lapband in 11/2009. 9. S/p cholecystectomy in 2009.  Randall [name],Randall [name] 01/15/2012, 4:42 PM   Attending Nephrologist: Delano Metz, MD  Patient seen and examined and agree with assessment and plan as above. 46 yo HD patient with hx of obesity, HTN s/p surgical procedures for obesity admitted with N/V and abd pain. Stable from a renal standpoint, no gross vol excess. Plan HD tomorrow.  Vinson Moselle  MD [facility] [phone] pgr    [phone] cell 01/15/2012, 5:05 PM

## 2012-01-15 NOTE — Progress Notes (Signed)
Clinical Social Work Department BRIEF PSYCHOSOCIAL ASSESSMENT 01/15/2012  Patient:  Randall [name], Randall [name]     Account Number:  [id number]     Admit date:  01/14/2012  Clinical Social Worker:  Dennison Bulla  Date/Time:  01/15/2012 11:00 AM  Referred by:  Physician  Date Referred:  01/15/2012 Referred for  Advanced Directives   Other Referral:   Interview type:  Patient Other interview type:   Son in room    PSYCHOSOCIAL DATA Living Status:  FAMILY Admitted from facility:   Level of care:   Primary support name:  Robin Primary support relationship to patient:  SPOUSE Degree of support available:   Strong    CURRENT CONCERNS Current Concerns  Other - See comment   Other Concerns:   Advanced directives    SOCIAL WORK ASSESSMENT / PLAN CSW received referral to assist with advanced directives information. CSW reviewed chart and met with patient at bedside. Son present but patient agreeable to son involvement during assessment.    CSW introduced myself and explained role. Patient reports that he had previously completed HCPOA paperwork but never got information notarized. CSW provided patient with packet and explained information. CSW assisted patient in completing information and notarized packet. CSW placed copy in chart and gave original copy to patient.    Patient reports no further needs at this time. CSW is signing off but available if needed.   Assessment/plan status:  No Further Intervention Required Other assessment/ plan:   Information/referral to community resources:   Advanced directives packet    PATIENT'S/FAMILY'S RESPONSE TO PLAN OF CARE: Patient alert and oriented. Patient agreeable to CSW consult and appreciative of assistance. Patient understanding of advanced directives documents.

## 2012-01-15 NOTE — Progress Notes (Addendum)
INITIAL ADULT NUTRITION ASSESSMENT Date: 01/15/2012   Time: 9:09 AM Reason for Assessment: MST (Malnutrition Screening Tool)   INTERVENTION: 1. When diet is advanced, small frequent meals and snacks 2. RD will continue to follow    DOCUMENTATION CODES Per approved criteria  -Obesity Unspecified    ASSESSMENT: Male 46 y.o.  Dx: abd pain   Hx:  Past Medical History  Diagnosis Date  . Chronic kidney disease     kidney failure  . Nausea   . Vomiting   . Stomach cramps   . Fever and chills   . Hyperlipidemia   . Thyroid disorder     takes sensipar  . Diabetes mellitus     controlling by weight/diet    Past Surgical History  Procedure Date  . Gastric restriction surgery 12/06/09    lap band  . Dialysis fistula creation 2011  . Insertion of dialysis catheter 11/13/2011    Procedure: INSERTION OF DIALYSIS CATHETER;  Surgeon: Sherren Kerns, MD;  Location: [facility] OR;  Service: Vascular;  Laterality: Right;  Insertion of 23cm dialysis catheter in right IJ  . Av fistula placement 12/05/2011    Procedure: ARTERIOVENOUS (AV) FISTULA CREATION;  Surgeon: Larina Earthly, MD;  Location: [facility] OR;  Service: Vascular;  Laterality: Left;    Related Meds:     . calcium acetate  2,001 mg Oral TID WC  . cinacalcet  60 mg Oral QHS  . ezetimibe  10 mg Oral QHS  . [COMPLETED] fentaNYL  200 mcg Intravenous Once  . [COMPLETED] fentaNYL  200 mcg Intravenous Once  . gabapentin  100 mg Oral QHS  . lanthanum  2,000 mg Oral TID WC  . metoprolol tartrate  25 mg Oral BID  . [COMPLETED] ondansetron (ZOFRAN) IV  4 mg Intravenous Once  . [COMPLETED] oxyCODONE-acetaminophen  1 tablet Oral Once  . pantoprazole (PROTONIX) IV  40 mg Intravenous Q24H  . [DISCONTINUED] sodium chloride   Intravenous STAT  . [DISCONTINUED] calcium acetate  1,334-2,001 mg Oral TID WC  . [DISCONTINUED] enoxaparin (LOVENOX) injection  40 mg Subcutaneous Q24H     Ht: 5\' 11"  (180.3 cm)  Wt: 261 lb 14.5 oz (118.8 kg)  Ideal Wt:  78 kg % Ideal Wt: 151%  Usual Wt:  Wt Readings from Last 10 Encounters:  01/15/12 261 lb 14.5 oz (118.8 kg)  01/08/12 271 lb (122.925 kg)  12/04/11 245 lb (111.131 kg)  12/04/11 245 lb (111.131 kg)  11/27/11 269 lb 11.2 oz (122.335 kg)  09/07/11 266 lb (120.657 kg)  08/02/11 266 lb (120.657 kg)  07/06/11 267 lb (121.11 kg)  07/05/11 252 lb (114.306 kg)  07/05/11 252 lb (114.306 kg)    % Usual Wt: ~100%  Body mass index is 36.53 kg/(m^2). Pt is obese class 2 per current BMI   Food/Nutrition Related Hx: Pt with lap band, hx of weight loss     Labs:  CMP     Component Value Date/Time   NA 138 01/15/2012 0630   K 5.4* 01/15/2012 0630   CL 99 01/15/2012 0630   CO2 25 01/15/2012 0630   GLUCOSE 97 01/15/2012 0630   BUN 31* 01/15/2012 0630   CREATININE 11.72* 01/15/2012 0630   CALCIUM 10.2 01/15/2012 0630   CALCIUM 7.4* 05/26/2010 0921   PROT 7.4 01/15/2012 0630   ALBUMIN 3.5 01/15/2012 0630   AST 22 01/15/2012 0630   ALT 18 01/15/2012 0630   ALKPHOS 73 01/15/2012 0630   BILITOT 0.4 01/15/2012 0630  GFRNONAA 5* 01/15/2012 0630   GFRAA 5* 01/15/2012 0630     Sodium  Date/Time Value Range Status  01/15/2012  6:30 AM 138  135 - 145 mEq/L Final  01/14/2012  3:04 PM 139  135 - 145 mEq/L Final  01/03/2012  2:44 AM 136  135 - 145 mEq/L Final    Potassium  Date/Time Value Range Status  01/15/2012  6:30 AM 5.4* 3.5 - 5.1 mEq/L Final  01/14/2012  3:04 PM 4.3  3.5 - 5.1 mEq/L Final  01/03/2012  2:44 AM 4.5  3.5 - 5.1 mEq/L Final    Phosphorus  Date/Time Value Range Status  11/22/2010 12:38 PM 7.1* 2.3 - 4.6 mg/dL Final  9/62/9528  4:13 AM 7.2* 2.3 - 4.6 mg/dL Final  2/44/0102  7:25 AM 8.4* 2.3 - 4.6 mg/dL Final    Magnesium  Date/Time Value Range Status  11/21/2010  5:00 AM 2.3  1.5 - 2.5 mg/dL Final    No intake or output data in the 24 hours ending 01/15/12 0912    Diet Order: Clear Liquid  Supplements/Tube Feeding: none   IVF:    Estimated Nutritional Needs:   Kcal:  2400-2600 Protein: 95-105 gm  Fluid:  2.4-2.6 L   Pt admitted with abd pain. Pt is s/p lap band surgery. Pt has been able to eat several small meals most days. Trying to follow a renal diet and eat increased protein. Has not been able to take phos binders r/t pain in abdomin.  Pt states abdominal pain in low in the abdomin.  Weight hx shows variable weights. Expect that weight loss is appropriate for lap band and fluid losses with HD.   NUTRITION DIAGNOSIS: -Altered GI function (NI-1.4).  Status: Ongoing  RELATED TO: surgery  AS EVIDENCE BY: lap band   MONITORING/EVALUATION(Goals): Goal: PO intake of small frequent meals to meet >/=90% protein needs  Monitor: PO intake, weight trend, labs, I/O's  EDUCATION NEEDS: -No education needs identified at this time Pt follows with RD at HD center for additional education     Clarene Duke RD, LDN Pager [phone] After Hours pager [phone]  01/15/2012, 9:09 AM

## 2012-01-15 NOTE — Progress Notes (Signed)
Patient ID: Randall [name], male   DOB: 1965/08/19, 46 y.o.   MRN: [id number] [facility] Surgery Progress Note:   * No surgery found *  Subjective: Mental status is clear.  Patient experience no abdominal pain at present.   Objective: Vital signs in last 24 hours: Temp:  [98 F (36.7 C)-98.8 F (37.1 C)] 98.3 F (36.8 C) (11/05 0509) Pulse Rate:  [85-108] 85  (11/05 0509) Resp:  [17-26] 18  (11/05 0509) BP: (159-188)/(89-109) 159/92 mmHg (11/05 0509) SpO2:  [95 %-100 %] 99 % (11/05 0509) Weight:  [261 lb 14.5 oz (118.8 kg)-263 lb 10.7 oz (119.6 kg)] 261 lb 14.5 oz (118.8 kg) (11/05 0509)  Intake/Output from previous day:   Intake/Output this shift:    Physical Exam: Work of breathing is normal.  Abdomen is soft.  Port site [place].    Lab Results:  Results for orders placed during the hospital encounter of 01/14/12 (from the past 48 hour(s))  CBC WITH DIFFERENTIAL     Status: Abnormal   Collection Time   01/14/12  3:04 PM      Component Value Range Comment   WBC 11.5 (*) 4.0 - 10.5 K/uL    RBC 4.06 (*) 4.22 - 5.81 MIL/uL    Hemoglobin 12.4 (*) 13.0 - 17.0 g/dL    HCT 41.3 (*) 24.4 - 52.0 %    MCV 94.8  78.0 - 100.0 fL    MCH 30.5  26.0 - 34.0 pg    MCHC 32.2  30.0 - 36.0 g/dL    RDW 01.0  27.2 - 53.6 %    Platelets 164  150 - 400 K/uL    Neutrophils Relative 89 (*) 43 - 77 %    Neutro Abs 10.2 (*) 1.7 - 7.7 K/uL    Lymphocytes Relative 8 (*) 12 - 46 %    Lymphs Abs 1.0  0.7 - 4.0 K/uL    Monocytes Relative 3  3 - 12 %    Monocytes Absolute 0.3  0.1 - 1.0 K/uL    Eosinophils Relative 0  0 - 5 %    Eosinophils Absolute 0.0  0.0 - 0.7 K/uL    Basophils Relative 0  0 - 1 %    Basophils Absolute 0.0  0.0 - 0.1 K/uL   BASIC METABOLIC PANEL     Status: Abnormal   Collection Time   01/14/12  3:04 PM      Component Value Range Comment   Sodium 139  135 - 145 mEq/L    Potassium 4.3  3.5 - 5.1 mEq/L    Chloride 99  96 - 112 mEq/L    CO2 23  19 - 32 mEq/L    Glucose, Bld 150  (*) 70 - 99 mg/dL    BUN 23  6 - 23 mg/dL    Creatinine, Ser 6.44 (*) 0.50 - 1.35 mg/dL    Calcium 03.4  8.4 - 10.5 mg/dL    GFR calc non Af Amer 6 (*) >90 mL/min    GFR calc Af Amer 6 (*) >90 mL/min   LIPASE, BLOOD     Status: Normal   Collection Time   01/14/12  3:04 PM      Component Value Range Comment   Lipase 19  11 - 59 U/L   CBC     Status: Abnormal   Collection Time   01/15/12  6:30 AM      Component Value Range Comment   WBC 13.6 (*)  4.0 - 10.5 K/uL WHITE COUNT CONFIRMED ON SMEAR   RBC 3.84 (*) 4.22 - 5.81 MIL/uL    Hemoglobin 12.1 (*) 13.0 - 17.0 g/dL    HCT 16.1 (*) 09.6 - 52.0 %    MCV 95.6  78.0 - 100.0 fL    MCH 31.5  26.0 - 34.0 pg    MCHC 33.0  30.0 - 36.0 g/dL    RDW 04.5  40.9 - 81.1 %    Platelets PLATELET CLUMPS NOTED ON SMEAR, UNABLE TO ESTIMATE  150 - 400 K/uL   COMPREHENSIVE METABOLIC PANEL     Status: Abnormal   Collection Time   01/15/12  6:30 AM      Component Value Range Comment   Sodium 138  135 - 145 mEq/L    Potassium 5.4 (*) 3.5 - 5.1 mEq/L    Chloride 99  96 - 112 mEq/L    CO2 25  19 - 32 mEq/L    Glucose, Bld 97  70 - 99 mg/dL    BUN 31 (*) 6 - 23 mg/dL    Creatinine, Ser 91.47 (*) 0.50 - 1.35 mg/dL    Calcium 82.9  8.4 - 10.5 mg/dL    Total Protein 7.4  6.0 - 8.3 g/dL    Albumin 3.5  3.5 - 5.2 g/dL    AST 22  0 - 37 U/L    ALT 18  0 - 53 U/L    Alkaline Phosphatase 73  39 - 117 U/L    Total Bilirubin 0.4  0.3 - 1.2 mg/dL    GFR calc non Af Amer 5 (*) >90 mL/min    GFR calc Af Amer 5 (*) >90 mL/min     Radiology/Results: Dg Abd Acute W/chest  01/14/2012  *RADIOLOGY REPORT*  Clinical Data: Nausea and vomiting since yesterday.  Right-sided abdominal pain.  History of diabetes end-stage renal disease. Prior laparoscopic band gastric surgery.  ACUTE ABDOMEN SERIES (ABDOMEN 2 VIEW & CHEST 1 VIEW)  Comparison: CT abdomen and pelvis 01/03/2012, 12/16/2011.  Portable chest x-ray 11/13/2011.  Findings: Bowel gas pattern unremarkable without  evidence of obstruction or significant ileus.  No evidence of free air or air- fluid levels on the lateral decubitus image.  Laparoscopic band appropriately oriented from 2 o'clock to 8 o'clock.  No visible opaque urinary tract calculi.  Right jugular dialysis catheter tips in the lower SVC and upper right atrium.  Cardiomediastinal silhouette unremarkable.  Lungs clear.  Pulmonary vascularity normal.  No visible pleural effusions.  IMPRESSION: No acute abdominal or pulmonary abnormality.   Original Report Authenticated By: Hulan Saas, M.D.     Anti-infectives: Anti-infectives    None      Assessment/Plan: Problem List: Patient Active Problem List  Diagnosis  . ESRD (end stage renal disease) on dialysis  . 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 End stage renal disease  . Vomiting  . Abdominal pain  . Hypertension    Patient has had some recurrent episodes of band like abdominal pain.  That have come and gone intermittently.  This could represent an internal hernia with intermittent partial obstruction.  It doesn't sound like a band problem to me.  He is on the active transplant list at [facility].  He has lost weight to qualify for this transplant with the band.  He has had a cholecystectomy.  Would observe for now and get CT with oral contrast during episode to look for swirling within his abdomen to suggest internal hernia.   *  No surgery found *    LOS: 1 day   Matt B. Daphine Deutscher, MD, [facility] [phone] beeper [phone]  01/15/2012 9:36 AM

## 2012-01-15 NOTE — H&P (Signed)
Randall [name] Dec 16, 1965  [id number].   Primary Care MD: [name]., MD  Requesting MD: Erick Blinks  Chief Complaint/Reason for Consult: Intractable n/v and abdominal pain.  HPI: 46 y/o male with history of morbid obesit awaiting for renal transplant that was treated with gastric bypass surgery 10 years ago. He did not change eating habits and even with surgery he was continuing to increase weight. Lapband was placed in 2011 and has been managed by Dr. Norva Riffle at [facility] every 4 months. Pt noticed that since last visit in June/28 after 1 cc added to his APL, he has been intermittently with nausea and vomiting and diffuse abdominal pain. For the past month he has been evaluated multiple times in ED for similar symptoms. Now admitted for worsening n/v and colicky diffuse abdominal pain. He reports mild constipation, last BM yesterday am of normal consistency.  Review of Systems - General ROS: positive for  - weight loss Respiratory ROS: no cough, shortness of breath, or wheezing Cardiovascular ROS: no chest pain or dyspnea on exertion Gastrointestinal ROS: positive for - abdominal pain, appetite loss and nausea/vomiting Genito-Urinary ROS: no dysuria, trouble voiding, or hematuria Neurological ROS: negative   Family History  Problem Relation Age of Onset  . Hypertension Mother   . Diabetes Mother   . Hypertension Father   . Diabetes Father     Past Medical History  Diagnosis Date  . Chronic kidney disease     kidney failure  . Nausea   . Vomiting   . Stomach cramps   . Fever and chills   . Hyperlipidemia   . Thyroid disorder     takes sensipar  . Diabetes mellitus     controlling by weight/diet    Past Surgical History  Procedure Date  . Gastric restriction surgery 12/06/09    lap band  . Dialysis fistula creation 2011  . Insertion of dialysis catheter 11/13/2011    Procedure: INSERTION OF DIALYSIS CATHETER;  Surgeon: Sherren Kerns, MD;  Location: [facility] OR;  Service:  Vascular;  Laterality: Right;  Insertion of 23cm dialysis catheter in right IJ  . Av fistula placement 12/05/2011    Procedure: ARTERIOVENOUS (AV) FISTULA CREATION;  Surgeon: Larina Earthly, MD;  Location: [facility] OR;  Service: Vascular;  Laterality: Left;    Social History:  reports that he has never smoked. He has never used smokeless tobacco. He reports that he does not drink alcohol or use illicit drugs.  Allergies: No Known Allergies  Medications Prior to Admission  Medication Sig Dispense Refill  . calcium acetate (PHOSLO) 667 MG capsule Take 1,334-2,001 mg by mouth 3 (three) times daily with meals. Take 3 capsules with each of 3 daily meals, and 2 capsules with each of 2 daily snacks.      . cinacalcet (SENSIPAR) 60 MG tablet Take 60 mg by mouth at bedtime.       Marland Kitchen ezetimibe (ZETIA) 10 MG tablet Take 10 mg by mouth at bedtime.       . gabapentin (NEURONTIN) 100 MG capsule Take 100 mg by mouth at bedtime.      Marland Kitchen HYDROcodone-acetaminophen (VICODIN) 5-500 MG per tablet Take 1-2 tablets by mouth every 6 (six) hours as needed for pain.  15 tablet  0  . lanthanum (FOSRENOL) 500 MG chewable tablet Chew 2,000 mg by mouth 3 (three) times daily with meals.      Marland Kitchen oxyCODONE (OXY IR/ROXICODONE) 5 MG immediate release tablet Take 5 mg by mouth every 6 (  six) hours as needed. For pain      . oxyCODONE-acetaminophen (PERCOCET) 5-325 MG per tablet Take 2 tablets by mouth every 4 (four) hours as needed for pain.  10 tablet  0    Blood pressure 159/92, pulse 85, temperature 98.3 F (36.8 C), temperature source Oral, resp. rate 18, height 5\' 11"  (1.803 m), weight 261 lb 14.5 oz (118.8 kg), SpO2 99.00%. Physical Exam:Gen:  NAD, laying in bed. HEENT: Moist mucous membranes  CV: Regular rate and rhythm, no murmurs rubs or gallops PULM: Clear to auscultation bilaterally. No wheezes/rales/rhonchi ABD: Obese, soft, non tender to palpation, not guarding, port palpable, normal bowel sounds.  EXT: No edema Neuro:  Alert and oriented x3. No focalization   Results for orders placed during the hospital encounter of 01/14/12 (from the past 48 hour(s))  CBC WITH DIFFERENTIAL     Status: Abnormal   Collection Time   01/14/12  3:04 PM      Component Value Range Comment   WBC 11.5 (*) 4.0 - 10.5 K/uL    RBC 4.06 (*) 4.22 - 5.81 MIL/uL    Hemoglobin 12.4 (*) 13.0 - 17.0 g/dL    HCT 40.9 (*) 81.1 - 52.0 %    MCV 94.8  78.0 - 100.0 fL    MCH 30.5  26.0 - 34.0 pg    MCHC 32.2  30.0 - 36.0 g/dL    RDW 91.4  78.2 - 95.6 %    Platelets 164  150 - 400 K/uL    Neutrophils Relative 89 (*) 43 - 77 %    Neutro Abs 10.2 (*) 1.7 - 7.7 K/uL    Lymphocytes Relative 8 (*) 12 - 46 %    Lymphs Abs 1.0  0.7 - 4.0 K/uL    Monocytes Relative 3  3 - 12 %    Monocytes Absolute 0.3  0.1 - 1.0 K/uL    Eosinophils Relative 0  0 - 5 %    Eosinophils Absolute 0.0  0.0 - 0.7 K/uL    Basophils Relative 0  0 - 1 %    Basophils Absolute 0.0  0.0 - 0.1 K/uL   BASIC METABOLIC PANEL     Status: Abnormal   Collection Time   01/14/12  3:04 PM      Component Value Range Comment   Sodium 139  135 - 145 mEq/L    Potassium 4.3  3.5 - 5.1 mEq/L    Chloride 99  96 - 112 mEq/L    CO2 23  19 - 32 mEq/L    Glucose, Bld 150 (*) 70 - 99 mg/dL    BUN 23  6 - 23 mg/dL    Creatinine, Ser 2.13 (*) 0.50 - 1.35 mg/dL    Calcium 08.6  8.4 - 10.5 mg/dL    GFR calc non Af Amer 6 (*) >90 mL/min    GFR calc Af Amer 6 (*) >90 mL/min   LIPASE, BLOOD     Status: Normal   Collection Time   01/14/12  3:04 PM      Component Value Range Comment   Lipase 19  11 - 59 U/L   CBC     Status: Abnormal   Collection Time   01/15/12  6:30 AM      Component Value Range Comment   WBC 13.6 (*) 4.0 - 10.5 K/uL WHITE COUNT CONFIRMED ON SMEAR   RBC 3.84 (*) 4.22 - 5.81 MIL/uL    Hemoglobin 12.1 (*) 13.0 -  17.0 g/dL    HCT 16.1 (*) 09.6 - 52.0 %    MCV 95.6  78.0 - 100.0 fL    MCH 31.5  26.0 - 34.0 pg    MCHC 33.0  30.0 - 36.0 g/dL    RDW 04.5  40.9 - 81.1 %     Platelets PLATELET CLUMPS NOTED ON SMEAR, UNABLE TO ESTIMATE  150 - 400 K/uL   COMPREHENSIVE METABOLIC PANEL     Status: Abnormal   Collection Time   01/15/12  6:30 AM      Component Value Range Comment   Sodium 138  135 - 145 mEq/L    Potassium 5.4 (*) 3.5 - 5.1 mEq/L    Chloride 99  96 - 112 mEq/L    CO2 25  19 - 32 mEq/L    Glucose, Bld 97  70 - 99 mg/dL    BUN 31 (*) 6 - 23 mg/dL    Creatinine, Ser 91.47 (*) 0.50 - 1.35 mg/dL    Calcium 82.9  8.4 - 10.5 mg/dL    Total Protein 7.4  6.0 - 8.3 g/dL    Albumin 3.5  3.5 - 5.2 g/dL    AST 22  0 - 37 U/L    ALT 18  0 - 53 U/L    Alkaline Phosphatase 73  39 - 117 U/L    Total Bilirubin 0.4  0.3 - 1.2 mg/dL    GFR calc non Af Amer 5 (*) >90 mL/min    GFR calc Af Amer 5 (*) >90 mL/min    Dg Abd Acute W/chest  01/14/2012 IMPRESSION: No acute abdominal or pulmonary abnormality.   Original Report Authenticated By: Hulan Saas, M.D.     Assessment/Plan 46 y/o M morbidly obese on dialysis admitted for intractable N/V and abdominal pain. - No signs of obstruction on KUB. Agree with the possibility of banding been too tight and causing partial obstruction.  - Fluid restriction due to kidney failure. Nephrology team involved on pt's care.   PILOTO, Kaeleen Odom 01/15/2012, 8:15 AM

## 2012-01-15 NOTE — H&P (Signed)
Hydrate No evid of slipped band Last 2 CT scans in past month _ for internal hernia, SBO, Inguinal hernia, diverticulitis, band port flip, etc - may need to repeat vs UGI Dr Daphine Deutscher to see & offer advice as well

## 2012-01-16 ENCOUNTER — Inpatient Hospital Stay (HOSPITAL_COMMUNITY): Payer: Medicare Other

## 2012-01-16 DIAGNOSIS — R109 Unspecified abdominal pain: Secondary | ICD-10-CM

## 2012-01-16 DIAGNOSIS — N186 End stage renal disease: Secondary | ICD-10-CM

## 2012-01-16 DIAGNOSIS — I10 Essential (primary) hypertension: Secondary | ICD-10-CM

## 2012-01-16 DIAGNOSIS — R112 Nausea with vomiting, unspecified: Secondary | ICD-10-CM

## 2012-01-16 LAB — CBC
HCT: 34 % — ABNORMAL LOW (ref 39.0–52.0)
MCHC: 32.4 g/dL (ref 30.0–36.0)
MCV: 95 fL (ref 78.0–100.0)
RDW: 14.5 % (ref 11.5–15.5)

## 2012-01-16 LAB — RENAL FUNCTION PANEL
Albumin: 3.1 g/dL — ABNORMAL LOW (ref 3.5–5.2)
BUN: 46 mg/dL — ABNORMAL HIGH (ref 6–23)
Creatinine, Ser: 14.08 mg/dL — ABNORMAL HIGH (ref 0.50–1.35)
Phosphorus: 6.9 mg/dL — ABNORMAL HIGH (ref 2.3–4.6)

## 2012-01-16 MED ORDER — PARICALCITOL 5 MCG/ML IV SOLN
INTRAVENOUS | Status: AC
  Administered 2012-01-16: 18 ug via INTRAVENOUS
  Filled 2012-01-16: qty 4

## 2012-01-16 MED ORDER — HEPARIN SODIUM (PORCINE) 5000 UNIT/ML IJ SOLN
5000.0000 [IU] | Freq: Three times a day (TID) | INTRAMUSCULAR | Status: DC
  Administered 2012-01-16 – 2012-01-17 (×3): 5000 [IU] via SUBCUTANEOUS
  Filled 2012-01-16 (×7): qty 1

## 2012-01-16 NOTE — Progress Notes (Signed)
Subjective: Taking liquids, had a muffin this am. No n/v.  Objective Vital signs in last 24 hours: Filed Vitals:   01/16/12 0746 01/16/12 0800 01/16/12 0830 01/16/12 0900  BP: 143/93 146/81 142/83 113/55  Pulse: 70 68 74 81  Temp: 97.8 F (36.6 C)     TempSrc: Oral     Resp: 18 18 18 16   Height:      Weight:      SpO2:       Weight change:   Intake/Output Summary (Last 24 hours) at 01/16/12 0935 Last data filed at 01/15/12 1500  Gross per 24 hour  Intake    240 ml  Output   1000 ml  Net   -760 ml   Labs: Basic Metabolic Panel:  Lab 01/16/12 9629 01/15/12 0630 01/14/12 1504  NA 136 138 139  K 4.8 5.4* 4.3  CL 97 99 99  CO2 25 25 23   GLUCOSE 83 97 150*  BUN 46* 31* 23  CREATININE 14.08* 11.72* 9.87*  ALB -- -- --  CALCIUM 9.3 10.2 10.1  PHOS 6.9* -- --   Liver Function Tests:  Lab 01/16/12 0731 01/15/12 0630  AST -- 22  ALT -- 18  ALKPHOS -- 73  BILITOT -- 0.4  PROT -- 7.4  ALBUMIN 3.1* 3.5    Lab 01/14/12 1504  LIPASE 19  AMYLASE --   No results found for this basename: AMMONIA:3 in the last 168 hours CBC:  Lab 01/16/12 0731 01/15/12 0630 01/14/12 1504  WBC 9.7 13.6* 11.5*  NEUTROABS -- -- 10.2*  HGB 11.0* 12.1* 12.4*  HCT 34.0* 36.7* 38.5*  MCV 95.0 95.6 94.8  PLT 165 PLATELET CLUMPS NOTED ON SMEAR, UNABLE TO ESTIMATE 164   PT/INR: @labrcntip (inr:5) Cardiac Enzymes: No results found for this basename: CKTOTAL:5,CKMB:5,CKMBINDEX:5,TROPONINI:5 in the last 168 hours CBG: No results found for this basename: GLUCAP:5 in the last 168 hours  Iron Studies: No results found for this basename: IRON:30,TIBC:30,TRANSFERRIN:30,FERRITIN:30 in the last 168 hours  Physical Exam:  Blood pressure 113/55, pulse 81, temperature 97.8 F (36.6 C), temperature source Oral, resp. rate 16, height 5\' 11"  (1.803 m), weight 121.4 kg (267 lb 10.2 oz), SpO2 94.00%.  General appearance: alert, cooperative and no distress  Head: Normocephalic, without obvious  abnormality, atraumatic  Neck: no adenopathy, no carotid bruit, no JVD and supple, symmetrical, trachea midline  Resp: clear to auscultation bilaterally  Cardio: regular rate and rhythm, S1, S2 normal, no murmur, click, rub or gallop  GI: + BS, soft with lower abdominal tenderness  Extremities: extremities normal, atraumatic, no cyanosis or edema  Neurologic: Grossly normal  Dialysis Access: Right IJ catheter, also AVF @ LUA   Dialysis Orders: Center: [facility] on MWF.  EDW 120.5 kg HD Bath 2K/2.5Ca Time 4 hrs 15 mins Heparin 10,000 U. Access right IJ catheter BFR 450 DFR 800  Zemplar 18 mcg IV/HD Epogen 0 Units IV/HD Venofer 0.   Assessment/Plan:  1. Abdominal pain/N/V- hx lap band and prior gastric bypass surgery. Improving symptoms.  2. ESRD- cont hd mwf 3. Dialysis access - failed left forearm AVF, using R TDC now. Had LUA AVF 1st stage 9/25 by Dr. Arbie Cookey; 2nd stage surgery was supposed to be tomorrow 11/7. Will d/w VVS, I think he is stable to undergo procedure.  4. Hypertension/volume - at dry weight, will stop metoprolol (started on admission) and treat HTN with volume control.  5. Anemia - Hgb 12.1, no outpatient Epogen or Fe. 6. Metabolic bone disease -  Ca 10.2, last P 8.6, last iPTH 368 on 10/16; on Zemplar 18 mcg, Sensipar 120 mg qd, Fosrenol 1000 mg 2 with meals. 7. Nutrition - Last Alb 4.1. 8. S/p cholecystectomy in 2009.   Randall Moselle  MD [facility] [phone] pgr    [phone] cell 01/16/2012, 9:35 AM

## 2012-01-16 NOTE — Plan of Care (Signed)
Problem: Limited Adherence to Nutrition-Related Recommendations (NB-1.6) Goal: Nutrition education Formal process to instruct or train a patient/client in a skill or to impart knowledge to help patients/clients voluntarily manage or modify food choices and eating behavior to maintain or improve health.  Outcome: Completed/Met Date Met:  01/16/12 RD consulted for diet education for pt s/p lap band placement who is also HD dependant.  Pt has had extensive education in the past for both lap band and HD, but is struggling to follow both diets.   RD provided pt with hand out for bariatric surgery nutrition therapy from the [employer]. Explained importance of high protein foods for both meeting kcal needs and protein losses with HD. Pt states he often does not eat regularly. Worked with pt to find snack ideas to increase the frequency of intake.  Pt's goal is to lose another 30 lbs. Encouraged pt to continue to talk with RD and HD center for support. Pt thankful for help from RD. No additional questions at this time.   RD will continue to follow.   Clarene Duke RD, LDN Pager [phone] After Hours pager [phone]

## 2012-01-16 NOTE — Progress Notes (Signed)
PATIENT DETAILS Name: Randall [name] Age: 46 y.o. Sex: male Date of Birth: [birth year] Admit Date: 01/14/2012 Admitting Physician Erick Blinks, MD YNW:[id number] R., MD  Subjective: No vomiting-wants to eat regular food  Assessment/Plan: Principal Problem: Intractable nausea and vomiting  -An unclear etiology, could be secondary to transient gastroenteritis versus problems with his lap band-but CT Abdomen did not show any major abnormalities -Tolerating liquids -Advance diet and continue supportive care -Potential d/c in am .  End-stage renal disease on hemodialysis  -Per Nephrology  Hypertension  -Metoprolol stopped by Renal-plan to treat with volume control with HD.  Disposition: Remain inpatient  DVT Prophylaxis: Prophylactic Heparin  Code Status: Full code  Procedures:  None  CONSULTS:  nephrology and general surgery  PHYSICAL EXAM: Vital signs in last 24 hours: Filed Vitals:   01/16/12 1100 01/16/12 1130 01/16/12 1200 01/16/12 1217  BP: 118/63 115/66 107/55 108/65  Pulse: 82 81 81 81  Temp:    97.1 F (36.2 C)  TempSrc:    Oral  Resp: 16 18 18 18   Height:      Weight:    119.5 kg (263 lb 7.2 oz)  SpO2:    97%    Weight change:  Body mass index is 36.74 kg/(m^2).   Gen Exam: Awake and alert with clear speech.  Neck: Supple, No JVD.   Chest: B/L Clear.   CVS: S1 S2 Regular, no murmurs.  Abdomen: soft, BS +, non tender, non distended.  Extremities: no edema, lower extremities warm to touch. Neurologic: Non Focal.   Skin: No Rash.   Wounds: N/A.   Intake/Output from previous day:  Intake/Output Summary (Last 24 hours) at 01/16/12 1415 Last data filed at 01/16/12 1217  Gross per 24 hour  Intake      0 ml  Output   3300 ml  Net  -3300 ml     LAB RESULTS: CBC  Lab 01/16/12 0731 01/15/12 0630 01/14/12 1504  WBC 9.7 13.6* 11.5*  HGB 11.0* 12.1* 12.4*  HCT 34.0* 36.7* 38.5*  PLT 165 PLATELET CLUMPS NOTED ON SMEAR, UNABLE TO  ESTIMATE 164  MCV 95.0 95.6 94.8  MCH 30.7 31.5 30.5  MCHC 32.4 33.0 32.2  RDW 14.5 14.8 14.5  LYMPHSABS -- -- 1.0  MONOABS -- -- 0.3  EOSABS -- -- 0.0  BASOSABS -- -- 0.0  BANDABS -- -- --    Chemistries   Lab 01/16/12 0731 01/15/12 0630 01/14/12 1504  NA 136 138 139  K 4.8 5.4* 4.3  CL 97 99 99  CO2 25 25 23   GLUCOSE 83 97 150*  BUN 46* 31* 23  CREATININE 14.08* 11.72* 9.87*  CALCIUM 9.3 10.2 10.1  MG -- -- --    CBG: No results found for this basename: GLUCAP:5 in the last 168 hours  GFR Estimated Creatinine Clearance: 8.6 ml/min (by C-G formula based on Cr of 14.08).  Coagulation profile No results found for this basename: INR:5,PROTIME:5 in the last 168 hours  Cardiac Enzymes No results found for this basename: CK:3,CKMB:3,TROPONINI:3,MYOGLOBIN:3 in the last 168 hours  No components found with this basename: POCBNP:3 No results found for this basename: DDIMER:2 in the last 72 hours No results found for this basename: HGBA1C:2 in the last 72 hours No results found for this basename: CHOL:2,HDL:2,LDLCALC:2,TRIG:2,CHOLHDL:2,LDLDIRECT:2 in the last 72 hours No results found for this basename: TSH,T4TOTAL,FREET3,T3FREE,THYROIDAB in the last 72 hours No results found for this basename: VITAMINB12:2,FOLATE:2,FERRITIN:2,TIBC:2,IRON:2,RETICCTPCT:2 in the last 72 hours  Basename 01/14/12 1504  LIPASE 19  AMYLASE --    Urine Studies No results found for this basename: UACOL:2,UAPR:2,USPG:2,UPH:2,UTP:2,UGL:2,UKET:2,UBIL:2,UHGB:2,UNIT:2,UROB:2,ULEU:2,UEPI:2,UWBC:2,URBC:2,UBAC:2,CAST:2,CRYS:2,UCOM:2,BILUA:2 in the last 72 hours  MICROBIOLOGY: No results found for this or any previous visit (from the past 240 hour(s)).  RADIOLOGY STUDIES/RESULTS: Ct Abdomen Pelvis W Contrast  01/15/2012  *RADIOLOGY REPORT*  Clinical Data:, history of laparoscopic gastric band surgery, chronic kidney disease on dialysis, diabetes  CT ABDOMEN AND PELVIS WITH CONTRAST  Technique:   Multidetector CT imaging of the abdomen and pelvis was performed following the standard protocol during bolus administration of intravenous contrast. Sagittal and coronal MPR images reconstructed from axial data set.  Contrast: OMNIPAQUE IOHEXOL 300 MG/ML  SOLN Dilute oral contrast.  Comparison: 01/03/2012  Findings: Minimal dependent atelectasis at lung bases. Catheter tip right atrium. Gallbladder surgically absent. Laparoscopic band at proximal stomach with gastrojejunostomy appears unchanged. Distal stomach decompressed. Mild fatty infiltration of liver. Mild biliary dilatation, predominately extrahepatic, unchanged. Liver, spleen, pancreas, atrophic kidneys, and adrenal glands otherwise normal.  Small amount of fluid adjacent to upper pole of right kidney appears unchanged. Small umbilical hernia containing fat. Bladder decompressed. Large and small bowel loops normal appearance. Normal appendix. Minimal scattered atherosclerotic calcification aorta and coronary arteries. No mass, adenopathy, free fluid or inflammatory process. No acute osseous findings.  IMPRESSION: Small umbilical hernia containing fat. Mild fatty infiltration of liver is stable mild biliary dilatation post cholecystectomy. Stable appearance of laparoscopic gastric band with gastrojejunostomy. No acute abnormalities.   Original Report Authenticated By: Ulyses Southward, M.D.    Ct Abdomen Pelvis W Contrast  01/03/2012  *RADIOLOGY REPORT*  Clinical Data: Left abdominal pain  CT ABDOMEN AND PELVIS WITH CONTRAST  Technique:  Multidetector CT imaging of the abdomen and pelvis was performed following the standard protocol during bolus administration of intravenous contrast.  Contrast: OMNIPAQUE IOHEXOL 300 MG/ML  SOLN  Comparison: 12/16/2011  Findings: Coronary artery calcification.  Normal heart size.  No pleural or pericardial effusion.  Low attenuation of the liver suggests fatty infiltration.  Mild biliary ductal dilatation.   Unremarkable spleen, pancreas, adrenal glands.  Atrophic kidneys with heterogeneous enhancement.  No perinephric fat stranding.  No hydronephrosis or hydroureter.  No bowel obstruction.  Colonic diverticulosis.  Normal appendix. Status post gastric banding with right lower quadrant subcutaneous reservoir. Phi angle within normal range.  In addition, there is evidence of gastric bypass with gastrojejunostomy.  No free intraperitoneal air or fluid.  No lymphadenopathy.  There is scattered atherosclerotic calcification of the aorta and its branches. No aneurysmal dilatation.  Decompressed bladder.  No pelvic adenopathy.  No acute osseous finding.  IMPRESSION: Gastric banding and gastrojejunostomy without evidence of obstruction.  Hepatic steatosis.  Mild biliary ductal prominence.  Correlate with LFTs and ERCP or MRCP if clinically warranted.  Sequelae of end-stage renal disease again noted.  Coronary artery calcification.   Original Report Authenticated By: Waneta Martins, M.D.    Dg Abd Acute W/chest  01/14/2012  *RADIOLOGY REPORT*  Clinical Data: Nausea and vomiting since yesterday.  Right-sided abdominal pain.  History of diabetes end-stage renal disease. Prior laparoscopic band gastric surgery.  ACUTE ABDOMEN SERIES (ABDOMEN 2 VIEW & CHEST 1 VIEW)  Comparison: CT abdomen and pelvis 01/03/2012, 12/16/2011.  Portable chest x-ray 11/13/2011.  Findings: Bowel gas pattern unremarkable without evidence of obstruction or significant ileus.  No evidence of free air or air- fluid levels on the lateral decubitus image.  Laparoscopic band appropriately oriented from 2 o'clock to 8 o'clock.  No visible opaque urinary  tract calculi.  Right jugular dialysis catheter tips in the lower SVC and upper right atrium.  Cardiomediastinal silhouette unremarkable.  Lungs clear.  Pulmonary vascularity normal.  No visible pleural effusions.  IMPRESSION: No acute abdominal or pulmonary abnormality.   Original Report Authenticated By:  Hulan Saas, M.D.     MEDICATIONS: Scheduled Meds:   . calcium acetate  2,001 mg Oral TID WC  . cinacalcet  60 mg Oral QHS  . ezetimibe  10 mg Oral QHS  . gabapentin  100 mg Oral QHS  . lanthanum  2,000 mg Oral TID WC  . pantoprazole (PROTONIX) IV  40 mg Intravenous Q24H  . paricalcitol  18 mcg Intravenous 3 times weekly  . [DISCONTINUED] metoprolol tartrate  25 mg Oral BID   Continuous Infusions:  PRN Meds:.acetaminophen, acetaminophen, calcium acetate, fentaNYL, heparin, [COMPLETED] iohexol, morphine injection, ondansetron (ZOFRAN) IV, ondansetron, oxyCODONE  Antibiotics: Anti-infectives    None       Jeoffrey Massed, MD  [facility] Pager:[phone]  If 7PM-7AM, please contact night-coverage [email or web address] Password [email or web address] 01/16/2012, 2:15 PM   LOS: 2 days

## 2012-01-16 NOTE — Procedures (Signed)
I was present at this dialysis session. I have reviewed the session itself and made appropriate changes.   Vinson Moselle, MD [employer] 01/16/2012, 9:46 AM

## 2012-01-16 NOTE — Progress Notes (Signed)
Subjective: Pt reports feeling better. He is tolerating current diet (clears) and denies symptoms of nausea,vomiting or abdominal pain.  Objective: Vital signs in last 24 hours: Temp:  [97.8 F (36.6 C)-98.4 F (36.9 C)] 97.8 F (36.6 C) (11/06 0746) Pulse Rate:  [64-85] 82  (11/06 1100) Resp:  [16-18] 16  (11/06 1100) BP: (101-146)/(55-93) 118/63 mmHg (11/06 1100) SpO2:  [94 %-99 %] 94 % (11/06 0616) Weight:  [267 lb 10.2 oz (121.4 kg)] 267 lb 10.2 oz (121.4 kg) (11/06 0724) Last BM Date: 01/13/12  Intake/Output from previous day: 11/05 0701 - 11/06 0700 In: 480 [P.O.:480] Out: 1000 [Urine:1000] Intake/Output this shift:    PE: Abd: obese, soft,  no tender to palpation, normal BS.  Lab Results:   Basename 01/16/12 0731 01/15/12 0630  WBC 9.7 13.6*  HGB 11.0* 12.1*  HCT 34.0* 36.7*  PLT 165 PLATELET CLUMPS NOTED ON SMEAR, UNABLE TO ESTIMATE   BMET  Basename 01/16/12 0731 01/15/12 0630  NA 136 138  K 4.8 5.4*  CL 97 99  CO2 25 25  GLUCOSE 83 97  BUN 46* 31*  CREATININE 14.08* 11.72*  CALCIUM 9.3 10.2   PT/INR No results found for this basename: LABPROT:2,INR:2 in the last 72 hours CMP     Component Value Date/Time   NA 136 01/16/2012 0731   K 4.8 01/16/2012 0731   CL 97 01/16/2012 0731   CO2 25 01/16/2012 0731   GLUCOSE 83 01/16/2012 0731   BUN 46* 01/16/2012 0731   CREATININE 14.08* 01/16/2012 0731   CALCIUM 9.3 01/16/2012 0731   CALCIUM 7.4* 05/26/2010 0921   PROT 7.4 01/15/2012 0630   ALBUMIN 3.1* 01/16/2012 0731   AST 22 01/15/2012 0630   ALT 18 01/15/2012 0630   ALKPHOS 73 01/15/2012 0630   BILITOT 0.4 01/15/2012 0630   GFRNONAA 4* 01/16/2012 0731   GFRAA 4* 01/16/2012 0731   Lipase     Component Value Date/Time   LIPASE 19 01/14/2012 1504    Studies/Results: Ct Abdomen Pelvis W Contrast 01/15/2012 IMPRESSION: Small umbilical hernia containing fat. Mild fatty infiltration of liver is stable mild biliary dilatation post cholecystectomy. Stable  appearance of laparoscopic gastric band with gastrojejunostomy. No acute abnormalities.   Original Report Authenticated By: Ulyses Southward, M.D.    Dg Abd Acute W/chest 01/14/2012  IMPRESSION: No acute abdominal or pulmonary abnormality.   Original Report Authenticated By: Hulan Saas, M.D.    Assessment/Plan 46 y/o M morbidly obese on dialysis admitted for intractable N/V and abdominal pain. Clinically improved with only intervention of diet restriction. CT with no evidence of internal hernia or acute abnormality. - Pt needs bariatric diet education. Nutrition consult placed. - Advance to full liquids bariatric diet. - DVT prophylaxis: Heparin.    LOS: 2 days   PILOTO, DAYARMYS 01/16/2012, 11:41 AM  No symptoms since admission.  No nausea or vomiting.  No abdominal pain since admission.  CT with small umbilical hernia but no other obvious cause such as internal hernia.  I do not think that this was from the band.  However, I am not sure what caused his symptoms either.  I would go ahead and feed him regular bariatric diet (high protein and low carb, low fat ) if okay from renal standpoint.  If he tolerates this then he should be able to go home when done with other procedures.  I would check an UGI and have him follow up with his surgeon soon.  If still in the hospital he  can get the UGI or he can have this as outpatient prior to his follow up with Dr. Daphine Deutscher.  If symptoms return, then I would check UGI and remove fluid from band.

## 2012-01-17 ENCOUNTER — Inpatient Hospital Stay (HOSPITAL_COMMUNITY): Payer: Medicare Other

## 2012-01-17 ENCOUNTER — Encounter (HOSPITAL_COMMUNITY): Payer: Self-pay | Admitting: Anesthesiology

## 2012-01-17 DIAGNOSIS — R109 Unspecified abdominal pain: Secondary | ICD-10-CM

## 2012-01-17 DIAGNOSIS — N186 End stage renal disease: Secondary | ICD-10-CM

## 2012-01-17 DIAGNOSIS — I10 Essential (primary) hypertension: Secondary | ICD-10-CM

## 2012-01-17 MED ORDER — PANTOPRAZOLE SODIUM 40 MG PO TBEC: 40.0000 mg | DELAYED_RELEASE_TABLET | Freq: Every day | ORAL | Status: DC

## 2012-01-17 MED ORDER — HYDROCODONE-ACETAMINOPHEN 5-500 MG PO TABS: 1.0000 | ORAL_TABLET | Freq: Four times a day (QID) | ORAL | Status: DC | PRN

## 2012-01-17 MED ORDER — IOHEXOL 300 MG/ML  SOLN
150.0000 mL | Freq: Once | INTRAMUSCULAR | Status: AC | PRN
  Administered 2012-01-17: 25 mL via ORAL

## 2012-01-17 MED ORDER — ONDANSETRON HCL 4 MG PO TABS: 4.0000 mg | ORAL_TABLET | Freq: Four times a day (QID) | ORAL | Status: DC | PRN

## 2012-01-17 MED ORDER — DEXTROSE 5 % IV SOLN
3.0000 g | Freq: Once | INTRAVENOUS | Status: DC
  Filled 2012-01-17: qty 3000

## 2012-01-17 NOTE — Progress Notes (Signed)
MD-please address pt's I & O order. Pt is a renal/HD pt but is currently ordered for routine I & O's.

## 2012-01-17 NOTE — Care Management Note (Signed)
    Page 1 of 1   01/17/2012     2:27:30 PM   CARE MANAGEMENT NOTE 01/17/2012  Patient:  Randall [name], Randall [name]   Account Number:  [id number]  Date Initiated:  01/15/2012  Documentation initiated by:  [facility]  Subjective/Objective Assessment:   n/v, Lap Band 2011, active kidney transplant list, ESRD     Action/Plan:   Anticipated DC Date:  01/17/2012   Anticipated DC Plan:  HOME/SELF CARE      DC Planning Services  CM consult      Choice offered to / List presented to:             Status of service:  Completed, signed off Medicare Important Message given?   (If response is "NO", the following Medicare IM given date fields will be blank) Date Medicare IM given:   Date Additional Medicare IM given:    Discharge Disposition:  HOME/SELF CARE  Per UR Regulation:  Reviewed for med. necessity/level of care/duration of stay  If discussed at Long Length of Stay Meetings, dates discussed:    Comments:  01/17/12 14:26 Letha Cape RN, BSN [phone] patient lives with spouse and son, pta independent.  No needs anticiapated.

## 2012-01-17 NOTE — Progress Notes (Signed)
Patient ID: Randall [name], male   DOB: 03/10/1966, 46 y.o.   MRN: [id number] I reviewed Randall [name] is upper GI series. He has a band over his bypass. I don't think this is slipped. The contrast goes to the band and into a small gastric pouch. I don't see any evidence of obstruction. I think he can start back on a liquid diet and advanced to a bariatric/renal failure.Marland Kitchen

## 2012-01-17 NOTE — Discharge Summary (Addendum)
PATIENT DETAILS Name: Randall [name] Age: 46 y.o. Sex: male Date of Birth: January 31, 1966 MRN: [id number]. Admit Date: 01/14/2012 Admitting Physician: No admitting provider for patient encounter. NWG:NFAOZHY,QMVHQION R., MD  Recommendations for Outpatient Follow-up:  1. 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 Chronic kidney disease     kidney failure  . Nausea   . Vomiting   . Stomach cramps   . Fever and chills   . Hyperlipidemia   . Thyroid disorder     takes sensipar  . Diabetes mellitus     controlling by weight/diet  . Lapband APL over bypass Sept 2011 05/24/2011    DISCHARGE MEDICATIONS:   Medication List     As of 01/17/2012 12:21 PM    STOP taking these medications         oxyCODONE 5 MG immediate release tablet   Commonly known as: Oxy IR/ROXICODONE      oxyCODONE-acetaminophen 5-325 MG per tablet   Commonly known as: PERCOCET/ROXICET      TAKE these medications         calcium acetate 667 MG capsule   Commonly known as: PHOSLO   Take 1,334-2,001 mg by mouth 3 (three) times daily with meals. Take 3 capsules with each of 3 daily meals, and 2 capsules with each of 2 daily snacks.      cinacalcet 60 MG tablet   Commonly known as: SENSIPAR   Take 60 mg by mouth at bedtime.      ezetimibe 10 MG tablet   Commonly known as: ZETIA   Take 10 mg by mouth at bedtime.      gabapentin 100 MG capsule   Commonly known as: NEURONTIN   Take 100 mg by mouth at bedtime.      HYDROcodone-acetaminophen 5-500 MG per tablet   Commonly known as: VICODIN   Take 1 tablet by mouth every 6 (six) hours as needed for pain.      lanthanum 500 MG chewable tablet   Commonly known as: FOSRENOL   Chew 2,000 mg by mouth 3  (three) times daily with meals.      ondansetron 4 MG tablet   Commonly known as: ZOFRAN   Take 1 tablet (4 mg total) by mouth every 6 (six) hours as needed for nausea.      pantoprazole 40 MG tablet   Commonly known as: PROTONIX   Take 1 tablet (40 mg total) by mouth daily.           BRIEF HPI:  See H&P, Labs, Consult and Test reports for all details in brief, Randall [name] is a 46 y.o. male with history of end-stage renal disease on hemodialysis Monday Wednesday and Friday, history of gastric bypass surgery over 10 years ago, history of laparoscopic gastric banding approximately 2 years ago. Patient has had multiple visits to the emergency room the past month for vomiting and abdominal pain. He reports that the fluid in his gastric band was increased approximately 2 months ago. For the past month has been having significant upper abdominal pain, vomiting. His symptoms are worse with solid food. He can tolerate small amounts of liquids. He reports having some difficulty passing stool, but also says  that he does his bowels every time he tries it.  CONSULTATIONS:  General Surgery  PERTINENT RADIOLOGIC STUDIES: Ct Abdomen Pelvis W Contrast  01/15/2012  *RADIOLOGY REPORT*  Clinical Data:, history of laparoscopic gastric band surgery, chronic kidney disease on dialysis, diabetes  CT ABDOMEN AND PELVIS WITH CONTRAST  Technique:  Multidetector CT imaging of the abdomen and pelvis was performed following the standard protocol during bolus administration of intravenous contrast. Sagittal and coronal MPR images reconstructed from axial data set.  Contrast: OMNIPAQUE IOHEXOL 300 MG/ML  SOLN Dilute oral contrast.  Comparison: 01/03/2012  Findings: Minimal dependent atelectasis at lung bases. Catheter tip right atrium. Gallbladder surgically absent. Laparoscopic band at proximal stomach with gastrojejunostomy appears unchanged. Distal stomach decompressed. Mild fatty infiltration of liver. Mild  biliary dilatation, predominately extrahepatic, unchanged. Liver, spleen, pancreas, atrophic kidneys, and adrenal glands otherwise normal.  Small amount of fluid adjacent to upper pole of right kidney appears unchanged. Small umbilical hernia containing fat. Bladder decompressed. Large and small bowel loops normal appearance. Normal appendix. Minimal scattered atherosclerotic calcification aorta and coronary arteries. No mass, adenopathy, free fluid or inflammatory process. No acute osseous findings.  IMPRESSION: Small umbilical hernia containing fat. Mild fatty infiltration of liver is stable mild biliary dilatation post cholecystectomy. Stable appearance of laparoscopic gastric band with gastrojejunostomy. No acute abnormalities.   Original Report Authenticated By: Ulyses Southward, M.D.    Ct Abdomen Pelvis W Contrast  01/03/2012  *RADIOLOGY REPORT*  Clinical Data: Left abdominal pain  CT ABDOMEN AND PELVIS WITH CONTRAST  Technique:  Multidetector CT imaging of the abdomen and pelvis was performed following the standard protocol during bolus administration of intravenous contrast.  Contrast: OMNIPAQUE IOHEXOL 300 MG/ML  SOLN  Comparison: 12/16/2011  Findings: Coronary artery calcification.  Normal heart size.  No pleural or pericardial effusion.  Low attenuation of the liver suggests fatty infiltration.  Mild biliary ductal dilatation.  Unremarkable spleen, pancreas, adrenal glands.  Atrophic kidneys with heterogeneous enhancement.  No perinephric fat stranding.  No hydronephrosis or hydroureter.  No bowel obstruction.  Colonic diverticulosis.  Normal appendix. Status post gastric banding with right lower quadrant subcutaneous reservoir. Phi angle within normal range.  In addition, there is evidence of gastric bypass with gastrojejunostomy.  No free intraperitoneal air or fluid.  No lymphadenopathy.  There is scattered atherosclerotic calcification of the aorta and its branches. No aneurysmal dilatation.   Decompressed bladder.  No pelvic adenopathy.  No acute osseous finding.  IMPRESSION: Gastric banding and gastrojejunostomy without evidence of obstruction.  Hepatic steatosis.  Mild biliary ductal prominence.  Correlate with LFTs and ERCP or MRCP if clinically warranted.  Sequelae of end-stage renal disease again noted.  Coronary artery calcification.   Original Report Authenticated By: Waneta Martins, M.D.    Dg Abd Acute W/chest  01/14/2012  *RADIOLOGY REPORT*  Clinical Data: Nausea and vomiting since yesterday.  Right-sided abdominal pain.  History of diabetes end-stage renal disease. Prior laparoscopic band gastric surgery.  ACUTE ABDOMEN SERIES (ABDOMEN 2 VIEW & CHEST 1 VIEW)  Comparison: CT abdomen and pelvis 01/03/2012, 12/16/2011.  Portable chest x-ray 11/13/2011.  Findings: Bowel gas pattern unremarkable without evidence of obstruction or significant ileus.  No evidence of free air or air- fluid levels on the lateral decubitus image.  Laparoscopic band appropriately oriented from 2 o'clock to 8 o'clock.  No visible opaque urinary tract calculi.  Right jugular dialysis catheter tips in the lower SVC and upper right atrium.  Cardiomediastinal silhouette unremarkable.  Lungs  clear.  Pulmonary vascularity normal.  No visible pleural effusions.  IMPRESSION: No acute abdominal or pulmonary abnormality.   Original Report Authenticated By: Hulan Saas, M.D.    Dg Kayleen Memos W/water Sol Cm  01/17/2012  *RADIOLOGY REPORT*  Clinical Data:  46 year old male with history of gastric bypass procedure and LapBand band procedure.  Intermittent nausea and vomiting.  UPPER GI SERIES WITH KUB  Technique:  Routine upper GI series was performed with Omnipaque- 300.  Fluoroscopy Time: 1.14 minutes  Comparison:  No priors.  Findings: Initial KUB demonstrates a lap band in position oriented from the 2 o'clock to 8 o'clock position.  However, the position of the LapBand appears be high (immediately beneath the left  hemidiaphragm).  After administration of water soluble contrast material, contrast readily traversed and the distal esophagus extending through the LapBand into the gastric remnant.  The contrast then immediately proceeded into the proximal small bowel.  IMPRESSION: 1.  Although the orientation of the lap band is normal (2 o'clock to 8 o'clock), the position is high (at the gastroesophageal junction) above the gastric remnant.  These results were called by telephone on 01/17/2012 at 09:10 a.m. to Dr. Michaell Cowing, who verbally acknowledged these results.   Original Report Authenticated By: Trudie Reed, M.D.      PERTINENT LAB RESULTS: CBC:  Basename 01/16/12 0731 01/15/12 0630  WBC 9.7 13.6*  HGB 11.0* 12.1*  HCT 34.0* 36.7*  PLT 165 PLATELET CLUMPS NOTED ON SMEAR, UNABLE TO ESTIMATE   CMET CMP     Component Value Date/Time   NA 136 01/16/2012 0731   K 4.8 01/16/2012 0731   CL 97 01/16/2012 0731   CO2 25 01/16/2012 0731   GLUCOSE 83 01/16/2012 0731   BUN 46* 01/16/2012 0731   CREATININE 14.08* 01/16/2012 0731   CALCIUM 9.3 01/16/2012 0731   CALCIUM 7.4* 05/26/2010 0921   PROT 7.4 01/15/2012 0630   ALBUMIN 3.1* 01/16/2012 0731   AST 22 01/15/2012 0630   ALT 18 01/15/2012 0630   ALKPHOS 73 01/15/2012 0630   BILITOT 0.4 01/15/2012 0630   GFRNONAA 4* 01/16/2012 0731   GFRAA 4* 01/16/2012 0731    GFR Estimated Creatinine Clearance: 8.6 ml/min (by C-G formula based on Cr of 14.08).  Basename 01/14/12 1504  LIPASE 19  AMYLASE --   No results found for this basename: CKTOTAL:3,CKMB:3,CKMBINDEX:3,TROPONINI:3 in the last 72 hours No components found with this basename: POCBNP:3 No results found for this basename: DDIMER:2 in the last 72 hours No results found for this basename: HGBA1C:2 in the last 72 hours No results found for this basename: CHOL:2,HDL:2,LDLCALC:2,TRIG:2,CHOLHDL:2,LDLDIRECT:2 in the last 72 hours No results found for this basename: TSH,T4TOTAL,FREET3,T3FREE,THYROIDAB in the last  72 hours No results found for this basename: VITAMINB12:2,FOLATE:2,FERRITIN:2,TIBC:2,IRON:2,RETICCTPCT:2 in the last 72 hours Coags: No results found for this basename: PT:2,INR:2 in the last 72 hours Microbiology: No results found for this or any previous visit (from the past 240 hour(s)).   BRIEF HOSPITAL COURSE:   Principal Problem:  *Vomiting -Intractable nausea and vomiting  -An unclear etiology, could be secondary to transient gastroenteritis versus problems with his lap band-but CT Abdomen did not show any major abnormalities.A UGI series was also done, which showed the band to be slightly high, this finding was discussed with the surgical team-and they did not have further recommendations. They suggested to d/c patient home and follow up with Dr Daphine Deutscher as outpatient. Patient is tolerating a regular bariatric diet now. On admission he was briefly kept NPO,  and diet was slowly advanced. He was provided with supportive care. He will be discharged with oral protonix and as need Zofran. On exam belly is soft and non tender. Will defer further w/u of this issue, if it recurrs-to the outpatient setting. Bariatric diet counseling was provided by staff dietician.  Active Problems: End-stage renal disease on hemodialysis  -Per Nephrology, did have HD done while inpatient -patient has been asked to resume his HD treatments on his prior schedule upon d/c  Hypertension  -Started on Metoprolol this admit, but stopped by Renal-plan to treat with volume control with HD.   TODAY-DAY OF DISCHARGE:  Subjective:   Randall [name] today has no vomiting, headache,no chest abdominal pain,no new weakness tingling or numbness, feels much better wants to go home today.   Objective:   Blood pressure 93/58, pulse 81, temperature 98.2 F (36.8 C), temperature source Oral, resp. rate 16, height 5\' 11"  (1.803 m), weight 119.5 kg (263 lb 7.2 oz), SpO2 95.00%.  Intake/Output Summary (Last 24 hours) at 01/17/12  1221 Last data filed at 01/17/12 0900  Gross per 24 hour  Intake    120 ml  Output      0 ml  Net    120 ml    Exam Awake Alert, Oriented *3, No new F.N deficits, Normal affect [place].AT,PERRAL Supple Neck,No JVD, No cervical lymphadenopathy appriciated.  Symmetrical Chest wall movement, Good air movement bilaterally, CTAB RRR,No Gallops,Rubs or new Murmurs, No Parasternal Heave +ve B.Sounds, Abd Soft, Non tender, No organomegaly appriciated, No rebound -guarding or rigidity. No Cyanosis, Clubbing or edema, No new Rash or bruise  DISCHARGE CONDITION: Stable  DISPOSITION: HOME  DISCHARGE INSTRUCTIONS:    Activity:  As tolerated  Diet recommendation: Renal Bariatric  Follow-up Information    Follow up with [name]., MD. Schedule an appointment as soon as possible for a visit in 1 week.   Contact information:   [address] [place] [address] [phone]       Follow up with Luretha Murphy B, MD. Schedule an appointment as soon as possible for a visit in 1 week.   Contact information:   [address] [place] [address] [phone]       Follow up with Zada Girt, MD. (follow up at your Dialysis center as scheduled)    Contact information:   [address] [facility] [place] [address] [phone]         Total Time spent on discharge equals 45 minutes.  SignedJeoffrey Massed 01/17/2012 12:21 PM

## 2012-01-17 NOTE — Progress Notes (Signed)
NURSING PROGRESS NOTE  TAEDYN GLASSCOCK [id number] Discharge Data: 01/17/2012 1:28 PM Attending Provider: Maretta Bees, MD WUJ:[id number],WGNFAOZH R., MD     Randall [name] to be D/C'd Home per MD order.  Discussed with the patient the After Visit Summary and all questions fully answered. All IV's discontinued with no bleeding noted. All belongings returned to patient for patient to take home.   Last Vital Signs:  Blood pressure 93/58, pulse 81, temperature 98.2 F (36.8 C), temperature source Oral, resp. rate 16, height 5\' 11"  (1.803 m), weight 119.5 kg (263 lb 7.2 oz), SpO2 95.00%.  Discharge Medication List   Medication List     As of 01/17/2012  1:28 PM    STOP taking these medications         oxyCODONE 5 MG immediate release tablet   Commonly known as: Oxy IR/ROXICODONE      oxyCODONE-acetaminophen 5-325 MG per tablet   Commonly known as: PERCOCET/ROXICET      TAKE these medications         calcium acetate 667 MG capsule   Commonly known as: PHOSLO   Take 1,334-2,001 mg by mouth 3 (three) times daily with meals. Take 3 capsules with each of 3 daily meals, and 2 capsules with each of 2 daily snacks.      cinacalcet 60 MG tablet   Commonly known as: SENSIPAR   Take 60 mg by mouth at bedtime.      ezetimibe 10 MG tablet   Commonly known as: ZETIA   Take 10 mg by mouth at bedtime.      gabapentin 100 MG capsule   Commonly known as: NEURONTIN   Take 100 mg by mouth at bedtime.      HYDROcodone-acetaminophen 5-500 MG per tablet   Commonly known as: VICODIN   Take 1 tablet by mouth every 6 (six) hours as needed for pain.      lanthanum 500 MG chewable tablet   Commonly known as: FOSRENOL   Chew 2,000 mg by mouth 3 (three) times daily with meals.      ondansetron 4 MG tablet   Commonly known as: ZOFRAN   Take 1 tablet (4 mg total) by mouth every 6 (six) hours as needed for nausea.      pantoprazole 40 MG tablet   Commonly known as: PROTONIX   Take 1 tablet (40  mg total) by mouth daily.        Rosalie Doctor, RN

## 2012-01-18 ENCOUNTER — Encounter (HOSPITAL_COMMUNITY): Admission: RE | Payer: Self-pay | Source: Ambulatory Visit

## 2012-01-18 ENCOUNTER — Ambulatory Visit (HOSPITAL_COMMUNITY): Admission: RE | Admit: 2012-01-18 | Payer: 59 | Source: Ambulatory Visit | Admitting: Vascular Surgery

## 2012-01-18 ENCOUNTER — Other Ambulatory Visit: Payer: Self-pay | Admitting: *Deleted

## 2012-01-18 ENCOUNTER — Encounter (HOSPITAL_COMMUNITY): Payer: Self-pay | Admitting: Anesthesiology

## 2012-01-18 ENCOUNTER — Encounter (HOSPITAL_COMMUNITY): Payer: Self-pay | Admitting: Pharmacy Technician

## 2012-01-18 SURGERY — REVISON OF ARTERIOVENOUS FISTULA
Anesthesia: General | Site: Arm Upper | Laterality: Left

## 2012-01-18 NOTE — Anesthesia Preprocedure Evaluation (Deleted)
Anesthesia Evaluation  Patient identified by MRN, date of birth, ID band Patient awake    Reviewed: Allergy & Precautions, H&P , NPO status , Patient's Chart, lab work & pertinent test results, reviewed documented beta blocker date and time   Airway Mallampati: II TM Distance: >3 FB Neck ROM: full    Dental   Pulmonary neg pulmonary ROS,  breath sounds clear to auscultation        Cardiovascular hypertension, On Medications Rhythm:regular     Neuro/Psych negative neurological ROS  negative psych ROS   GI/Hepatic negative GI ROS, Neg liver ROS,   Endo/Other  diabetes  Renal/GU ESRF and DialysisRenal disease  negative genitourinary   Musculoskeletal   Abdominal   Peds  Hematology negative hematology ROS (+)   Anesthesia Other Findings See surgeon's H&P   Reproductive/Obstetrics negative OB ROS                           Anesthesia Physical Anesthesia Plan  ASA: III  Anesthesia Plan: General   Post-op Pain Management:    Induction: Intravenous  Airway Management Planned: LMA  Additional Equipment:   Intra-op Plan:   Post-operative Plan: Extubation in OR  Informed Consent: I have reviewed the patients History and Physical, chart, labs and discussed the procedure including the risks, benefits and alternatives for the proposed anesthesia with the patient or authorized representative who has indicated his/her understanding and acceptance.   Dental Advisory Given  Plan Discussed with: CRNA and Surgeon  Anesthesia Plan Comments:         Anesthesia Quick Evaluation

## 2012-01-22 ENCOUNTER — Encounter (HOSPITAL_COMMUNITY): Payer: Self-pay | Admitting: *Deleted

## 2012-01-22 NOTE — Progress Notes (Signed)
Pt does not remember where he had sleep study done, but says he no longer has it; since weight loss.  I called Dr Bosie Helper office and asked Darel Hong to enter consent orders.

## 2012-01-23 ENCOUNTER — Encounter (HOSPITAL_COMMUNITY)
Admission: RE | Disposition: A | Discharge status: Home / Self Care | Payer: Self-pay | Source: Ambulatory Visit | Attending: Vascular Surgery

## 2012-01-23 ENCOUNTER — Encounter (HOSPITAL_COMMUNITY): Payer: Self-pay | Admitting: Anesthesiology

## 2012-01-23 ENCOUNTER — Ambulatory Visit (HOSPITAL_COMMUNITY)
Admission: RE | Admit: 2012-01-23 | Discharge: 2012-01-23 | Disposition: A | Discharge status: Home / Self Care | Payer: 59 | Source: Ambulatory Visit | Attending: Vascular Surgery | Admitting: Vascular Surgery

## 2012-01-23 ENCOUNTER — Ambulatory Visit (HOSPITAL_COMMUNITY): Payer: 59 | Admitting: Anesthesiology

## 2012-01-23 DIAGNOSIS — Z992 Dependence on renal dialysis: Secondary | ICD-10-CM | POA: Insufficient documentation

## 2012-01-23 DIAGNOSIS — I120 Hypertensive chronic kidney disease with stage 5 chronic kidney disease or end stage renal disease: Secondary | ICD-10-CM | POA: Insufficient documentation

## 2012-01-23 DIAGNOSIS — T82898A Other specified complication of vascular prosthetic devices, implants and grafts, initial encounter: Secondary | ICD-10-CM

## 2012-01-23 DIAGNOSIS — K219 Gastro-esophageal reflux disease without esophagitis: Secondary | ICD-10-CM | POA: Insufficient documentation

## 2012-01-23 DIAGNOSIS — E119 Type 2 diabetes mellitus without complications: Secondary | ICD-10-CM | POA: Insufficient documentation

## 2012-01-23 DIAGNOSIS — E669 Obesity, unspecified: Secondary | ICD-10-CM | POA: Insufficient documentation

## 2012-01-23 DIAGNOSIS — N186 End stage renal disease: Secondary | ICD-10-CM | POA: Insufficient documentation

## 2012-01-23 HISTORY — PX: AV FISTULA PLACEMENT: SHX1204

## 2012-01-23 LAB — POCT I-STAT 4, (NA,K, GLUC, HGB,HCT)
HCT: 36 % — ABNORMAL LOW (ref 39.0–52.0)
Hemoglobin: 12.2 g/dL — ABNORMAL LOW (ref 13.0–17.0)
Potassium: 4.1 mEq/L (ref 3.5–5.1)
Sodium: 140 mEq/L (ref 135–145)

## 2012-01-23 LAB — GLUCOSE, CAPILLARY: Glucose-Capillary: 83 mg/dL (ref 70–99)

## 2012-01-23 SURGERY — FISTULA SUPERFICIALIZATION
Anesthesia: General | Site: Arm Upper | Laterality: Left | Wound class: Clean

## 2012-01-23 MED ORDER — OXYCODONE HCL 5 MG/5ML PO SOLN: 5.0000 mg | Freq: Once | ORAL | Status: DC | PRN

## 2012-01-23 MED ORDER — OXYCODONE HCL 5 MG PO TABS: 5.0000 mg | ORAL_TABLET | Freq: Once | ORAL | Status: DC | PRN

## 2012-01-23 MED ORDER — ONDANSETRON HCL 4 MG/2ML IJ SOLN: 4.0000 mg | Freq: Four times a day (QID) | INTRAMUSCULAR | Status: DC | PRN

## 2012-01-23 MED ORDER — HYDROCODONE-ACETAMINOPHEN 5-500 MG PO TABS: 1.0000 | ORAL_TABLET | Freq: Four times a day (QID) | ORAL | Status: DC | PRN

## 2012-01-23 MED ORDER — SODIUM CHLORIDE 0.9 % IV SOLN: INTRAVENOUS | Status: DC

## 2012-01-23 MED ORDER — CEFAZOLIN SODIUM 1-5 GM-% IV SOLN
3.0000 g | Freq: Once | INTRAVENOUS | Status: AC
  Administered 2012-01-23: 3 g via INTRAVENOUS

## 2012-01-23 MED ORDER — FENTANYL CITRATE 0.05 MG/ML IJ SOLN
25.0000 ug | INTRAMUSCULAR | Status: DC | PRN
  Administered 2012-01-23 (×3): 25 ug via INTRAVENOUS

## 2012-01-23 MED ORDER — MUPIROCIN 2 % EX OINT: TOPICAL_OINTMENT | Freq: Two times a day (BID) | CUTANEOUS | Status: DC

## 2012-01-23 MED ORDER — PROPOFOL 10 MG/ML IV BOLUS
INTRAVENOUS | Status: DC | PRN
  Administered 2012-01-23: 50 mg via INTRAVENOUS
  Administered 2012-01-23: 200 mg via INTRAVENOUS

## 2012-01-23 MED ORDER — SODIUM CHLORIDE 0.9 % IR SOLN
Status: DC | PRN
  Administered 2012-01-23: 1000 mL

## 2012-01-23 MED ORDER — ONDANSETRON HCL 4 MG/2ML IJ SOLN
INTRAMUSCULAR | Status: DC | PRN
  Administered 2012-01-23: 4 mg via INTRAVENOUS

## 2012-01-23 MED ORDER — SODIUM CHLORIDE 0.9 % IV SOLN
INTRAVENOUS | Status: DC | PRN
  Administered 2012-01-23 (×2): via INTRAVENOUS

## 2012-01-23 MED ORDER — LIDOCAINE-EPINEPHRINE 0.5 %-1:200000 IJ SOLN
INTRAMUSCULAR | Status: AC
  Filled 2012-01-23: qty 1

## 2012-01-23 MED ORDER — MIDAZOLAM HCL 5 MG/5ML IJ SOLN
INTRAMUSCULAR | Status: DC | PRN
  Administered 2012-01-23: 2 mg via INTRAVENOUS

## 2012-01-23 MED ORDER — FENTANYL CITRATE 0.05 MG/ML IJ SOLN
INTRAMUSCULAR | Status: AC
  Administered 2012-01-23: 25 ug via INTRAVENOUS
  Filled 2012-01-23: qty 2

## 2012-01-23 MED ORDER — SODIUM CHLORIDE 0.9 % IR SOLN
Status: DC | PRN
  Administered 2012-01-23: 08:00:00

## 2012-01-23 MED ORDER — PHENYLEPHRINE HCL 10 MG/ML IJ SOLN
INTRAMUSCULAR | Status: DC | PRN
  Administered 2012-01-23: 80 ug via INTRAVENOUS

## 2012-01-23 MED ORDER — MUPIROCIN 2 % EX OINT
TOPICAL_OINTMENT | CUTANEOUS | Status: AC
  Filled 2012-01-23: qty 22

## 2012-01-23 MED ORDER — CEFAZOLIN SODIUM 1-5 GM-% IV SOLN
INTRAVENOUS | Status: AC
  Filled 2012-01-23: qty 50

## 2012-01-23 MED ORDER — CEFAZOLIN SODIUM-DEXTROSE 2-3 GM-% IV SOLR
INTRAVENOUS | Status: AC
  Filled 2012-01-23: qty 50

## 2012-01-23 MED ORDER — FENTANYL CITRATE 0.05 MG/ML IJ SOLN
INTRAMUSCULAR | Status: DC | PRN
  Administered 2012-01-23 (×2): 25 ug via INTRAVENOUS
  Administered 2012-01-23: 50 ug via INTRAVENOUS
  Administered 2012-01-23: 25 ug via INTRAVENOUS

## 2012-01-23 SURGICAL SUPPLY — 37 items
BANDAGE ELASTIC 4 VELCRO ST LF (GAUZE/BANDAGES/DRESSINGS) ×4 IMPLANT
BANDAGE GAUZE ELAST BULKY 4 IN (GAUZE/BANDAGES/DRESSINGS) ×4 IMPLANT
BENZOIN TINCTURE PRP APPL 2/3 (GAUZE/BANDAGES/DRESSINGS) ×4 IMPLANT
CANISTER SUCTION 2500CC (MISCELLANEOUS) ×2 IMPLANT
CLIP LIGATING EXTRA MED SLVR (CLIP) ×2 IMPLANT
CLIP LIGATING EXTRA SM BLUE (MISCELLANEOUS) ×2 IMPLANT
CLOTH BEACON ORANGE TIMEOUT ST (SAFETY) ×2 IMPLANT
CLSR STERI-STRIP ANTIMIC 1/2X4 (GAUZE/BANDAGES/DRESSINGS) ×4 IMPLANT
COVER PROBE W GEL 5X96 (DRAPES) ×2 IMPLANT
COVER SURGICAL LIGHT HANDLE (MISCELLANEOUS) ×2 IMPLANT
DECANTER SPIKE VIAL GLASS SM (MISCELLANEOUS) ×2 IMPLANT
ELECT REM PT RETURN 9FT ADLT (ELECTROSURGICAL) ×2
ELECTRODE REM PT RTRN 9FT ADLT (ELECTROSURGICAL) ×1 IMPLANT
GEL ULTRASOUND 20GR AQUASONIC (MISCELLANEOUS) IMPLANT
GLOVE BIO SURGEON STRL SZ 6.5 (GLOVE) ×4 IMPLANT
GLOVE BIOGEL PI IND STRL 6.5 (GLOVE) ×1 IMPLANT
GLOVE BIOGEL PI IND STRL 7.0 (GLOVE) ×3 IMPLANT
GLOVE BIOGEL PI INDICATOR 6.5 (GLOVE) ×1
GLOVE BIOGEL PI INDICATOR 7.0 (GLOVE) ×3
GLOVE SS BIOGEL STRL SZ 7.5 (GLOVE) ×1 IMPLANT
GLOVE SUPERSENSE BIOGEL SZ 7.5 (GLOVE) ×1
GOWN STRL NON-REIN LRG LVL3 (GOWN DISPOSABLE) ×6 IMPLANT
KIT BASIN OR (CUSTOM PROCEDURE TRAY) ×2 IMPLANT
KIT ROOM TURNOVER OR (KITS) ×2 IMPLANT
NS IRRIG 1000ML POUR BTL (IV SOLUTION) ×2 IMPLANT
PACK CV ACCESS (CUSTOM PROCEDURE TRAY) ×2 IMPLANT
PAD ARMBOARD 7.5X6 YLW CONV (MISCELLANEOUS) ×4 IMPLANT
SPONGE GAUZE 4X4 12PLY (GAUZE/BANDAGES/DRESSINGS) ×4 IMPLANT
STRIP CLOSURE SKIN 1/2X4 (GAUZE/BANDAGES/DRESSINGS) ×2 IMPLANT
SUT PROLENE 6 0 CC (SUTURE) ×2 IMPLANT
SUT VIC AB 3-0 SH 27 (SUTURE) ×3
SUT VIC AB 3-0 SH 27X BRD (SUTURE) ×3 IMPLANT
TAPE CLOTH SURG 4X10 WHT LF (GAUZE/BANDAGES/DRESSINGS) ×2 IMPLANT
TOWEL OR 17X24 6PK STRL BLUE (TOWEL DISPOSABLE) ×2 IMPLANT
TOWEL OR 17X26 10 PK STRL BLUE (TOWEL DISPOSABLE) ×2 IMPLANT
UNDERPAD 30X30 INCONTINENT (UNDERPADS AND DIAPERS) ×2 IMPLANT
WATER STERILE IRR 1000ML POUR (IV SOLUTION) ×2 IMPLANT

## 2012-01-23 NOTE — Anesthesia Postprocedure Evaluation (Signed)
Anesthesia Post Note  Patient: Randall [name]  Procedure(s) Performed: Procedure(s) (LRB): FISTULA SUPERFICIALIZATION (Left)  Anesthesia type: General  Patient location: PACU  Post pain: Pain level controlled and Adequate analgesia  Post assessment: Post-op Vital signs reviewed, Patient's Cardiovascular Status Stable, Respiratory Function Stable, Patent Airway and Pain level controlled  Last Vitals:  Filed Vitals:   01/23/12 1044  BP:   Pulse: 84  Temp:   Resp: 13    Post vital signs: Reviewed and stable  Level of consciousness: awake, alert  and oriented  Complications: No apparent anesthesia complications

## 2012-01-23 NOTE — Progress Notes (Signed)
Pt's wife called to come and pick him up.

## 2012-01-23 NOTE — Progress Notes (Signed)
Right chest HD cath blue prot flushed with 10ml NS and 2.4units 1000units/47ml Heparin per protocol for discharge - doen by VSandritter RN/[facility]

## 2012-01-23 NOTE — Progress Notes (Signed)
Pt has positive bruit and thrill

## 2012-01-23 NOTE — Interval H&P Note (Signed)
History and Physical Interval Note:  01/23/2012 8:04 AM  Randall [name]  has presented today for surgery, with the diagnosis of ESRD  The various methods of treatment have been discussed with the patient and family. After consideration of risks, benefits and other options for treatment, the patient has consented to  Procedure(s) (LRB) with comments: REVISON OF ARTERIOVENOUS FISTULA (Left) - SUPERFICIALIZATION as a surgical intervention .  The patient's history has been reviewed, patient examined, no change in status, stable for surgery.  I have reviewed the patient's chart and labs.  Questions were answered to the patient's satisfaction.     EARLY, TODD

## 2012-01-23 NOTE — Op Note (Signed)
OPERATIVE REPORT  DATE OF SURGERY: 01/23/2012  PATIENT: Randall [name], 46 y.o. male MRN: [id number]  DOB: [birth year]  PRE-OPERATIVE DIAGNOSIS: End-stage renal disease with poorly functioning left upper arm AV fistula  POST-OPERATIVE DIAGNOSIS:  Same  PROCEDURE: #1 ligation of 2 competing branches left upper arm AV fistula #2 superficial mobilization of left upper arm AV fistula  SURGEON:  Gretta Began, M.D.  PHYSICIAN ASSISTANT: Roczniak  ANESTHESIA:  Gen.  EBL: Minimal ml  Total I/O In: 600 [I.V.:600] Out: -   BLOOD ADMINISTERED: None  DRAINS: None  SPECIMEN: None  COUNTS CORRECT:  YES  PLAN OF CARE: PACU   PATIENT DISPOSITION:  PACU - hemodynamically stable  PROCEDURE DETAILS: The patient was taken to the operating room and placed supine position where the area of the left arm was prepped and draped in usual sterile fashion. Ultrasound visualize the fistula and marked it on the scan and also to mark the competing branches. 2 incisions were made over the cephalic vein fistula. One near the antecubital space and one closer towards the shoulder. The fat was resected from between the level of the skin and the vein throughout the area from the antecubital space to the shoulder. The cephalic mobilized from the surrounding fascia. The vein was of good size. There were 2 large competing branches arising off the fistula and these were both ligated and divided. The wound was irrigated with saline hemostasis was obtained with electrocautery. Were closed with 3-0 Vicryl in the subcuticular tissue. Benzoin and Steri-Strips were applied. A Kerlix and Ace wrap were applied to the arm and the patient was transferred to the recovery room in stable condition   Gretta Began, M.D. 01/23/2012 10:02 AM

## 2012-01-23 NOTE — Preoperative (Signed)
Beta Blockers   Reason not to administer Beta Blockers:Not Applicable 

## 2012-01-23 NOTE — Anesthesia Procedure Notes (Signed)
Procedure Name: LMA Insertion Date/Time: 01/23/2012 8:31 AM Performed by: Carmela Rima Pre-anesthesia Checklist: Patient identified, Timeout performed, Emergency Drugs available, Suction available and Patient being monitored Patient Re-evaluated:Patient Re-evaluated prior to inductionOxygen Delivery Method: Circle system utilized Preoxygenation: Pre-oxygenation with 100% oxygen Intubation Type: IV induction Ventilation: Mask ventilation without difficulty LMA: LMA inserted LMA Size: 4.0 Number of attempts: 1 Placement Confirmation: positive ETCO2 and breath sounds checked- equal and bilateral Tube secured with: Tape Dental Injury: Teeth and Oropharynx as per pre-operative assessment

## 2012-01-23 NOTE — Anesthesia Preprocedure Evaluation (Addendum)
Anesthesia Evaluation  Patient identified by MRN, date of birth, ID band Patient awake    Reviewed: Allergy & Precautions, H&P , NPO status , Patient's Chart, lab work & pertinent test results  Airway Mallampati: II  Neck ROM: full    Dental  (+) Teeth Intact and Dental Advidsory Given   Pulmonary          Cardiovascular hypertension,     Neuro/Psych    GI/Hepatic GERD-  ,  Endo/Other  diabetes, Type 2obese  Renal/GU ESRF and DialysisRenal disease     Musculoskeletal   Abdominal   Peds  Hematology   Anesthesia Other Findings   Reproductive/Obstetrics                          Anesthesia Physical Anesthesia Plan  ASA: III  Anesthesia Plan: General   Post-op Pain Management:    Induction: Intravenous  Airway Management Planned: LMA  Additional Equipment:   Intra-op Plan:   Post-operative Plan:   Informed Consent: I have reviewed the patients History and Physical, chart, labs and discussed the procedure including the risks, benefits and alternatives for the proposed anesthesia with the patient or authorized representative who has indicated his/her understanding and acceptance.   Dental Advisory Given  Plan Discussed with: CRNA and Surgeon  Anesthesia Plan Comments:        Anesthesia Quick Evaluation

## 2012-01-23 NOTE — Transfer of Care (Signed)
Immediate Anesthesia Transfer of Care Note  Patient: Randall [name]  Procedure(s) Performed: Procedure(s) (LRB) with comments: FISTULA SUPERFICIALIZATION (Left)  Patient Location: PACU  Anesthesia Type:General  Level of Consciousness: awake, alert  and oriented  Airway & Oxygen Therapy: Patient Spontanous Breathing and Patient connected to nasal cannula oxygen  Post-op Assessment: Report given to PACU RN, Post -op Vital signs reviewed and stable and Patient moving all extremities X 4  Post vital signs: Reviewed and stable  Complications: No apparent anesthesia complications

## 2012-01-23 NOTE — H&P (View-Only) (Signed)
The patient presents today for followup of creation of left upper arm AV fistula by myself on 12/05/2011. He has completely healed his antecubital wound. He did have a forearm fistula created in the past and has failed to mature. On physical exam he does have more dilated tributary veins over his left arm. He does have an excellent thrill and excellent size of the cephalic vein from the antecubital space forward. I imaged this with SonoSite ultrasound. This does show very good size maturation of the course of the vein. The vein does run rather deep. I explained to the patient that this is certainly marginal for being able be reliably access do to depth. He wishes to proceed at this time with mobilizing the vein to a more superficial location and ligation of side branches at the same setting. He dialyzes on Monday Wednesday and Friday and wishes to have surgery on Thursday from her work schedule standpoint. I explained that I do not do surgery on Tuesday source Thursdays. I we have scheduled this with Dr. Dickson on November 7 as an outpatient at the hospital 

## 2012-02-15 ENCOUNTER — Encounter (HOSPITAL_COMMUNITY): Payer: Self-pay | Admitting: *Deleted

## 2012-02-15 ENCOUNTER — Emergency Department (HOSPITAL_COMMUNITY): Payer: 59

## 2012-02-15 DIAGNOSIS — N186 End stage renal disease: Secondary | ICD-10-CM | POA: Diagnosis present

## 2012-02-15 DIAGNOSIS — R112 Nausea with vomiting, unspecified: Principal | ICD-10-CM | POA: Diagnosis present

## 2012-02-15 DIAGNOSIS — Z992 Dependence on renal dialysis: Secondary | ICD-10-CM

## 2012-02-15 DIAGNOSIS — Z79899 Other long term (current) drug therapy: Secondary | ICD-10-CM

## 2012-02-15 DIAGNOSIS — N2581 Secondary hyperparathyroidism of renal origin: Secondary | ICD-10-CM | POA: Diagnosis present

## 2012-02-15 DIAGNOSIS — I120 Hypertensive chronic kidney disease with stage 5 chronic kidney disease or end stage renal disease: Secondary | ICD-10-CM | POA: Diagnosis present

## 2012-02-15 DIAGNOSIS — E119 Type 2 diabetes mellitus without complications: Secondary | ICD-10-CM | POA: Diagnosis present

## 2012-02-15 LAB — COMPREHENSIVE METABOLIC PANEL
ALT: 18 U/L (ref 0–53)
AST: 22 U/L (ref 0–37)
Albumin: 4.2 g/dL (ref 3.5–5.2)
Alkaline Phosphatase: 70 U/L (ref 39–117)
CO2: 26 mEq/L (ref 19–32)
Chloride: 97 mEq/L (ref 96–112)
GFR calc non Af Amer: 8 mL/min — ABNORMAL LOW (ref >90)
Potassium: 3.5 mEq/L (ref 3.5–5.1)
Sodium: 141 mEq/L (ref 135–145)
Total Bilirubin: 0.5 mg/dL (ref 0.3–1.2)

## 2012-02-15 LAB — CBC
Platelets: 175 10*3/uL (ref 150–400)
RBC: 4.15 MIL/uL — ABNORMAL LOW (ref 4.22–5.81)
RDW: 14.2 % (ref 11.5–15.5)
WBC: 10.5 10*3/uL (ref 4.0–10.5)

## 2012-02-15 LAB — POCT I-STAT TROPONIN I

## 2012-02-15 MED ORDER — NITROGLYCERIN 0.4 MG SL SUBL
0.4000 mg | SUBLINGUAL_TABLET | SUBLINGUAL | Status: DC | PRN
  Administered 2012-02-15: 0.4 mg via SUBLINGUAL
  Filled 2012-02-15: qty 25

## 2012-02-15 MED ORDER — ONDANSETRON 4 MG PO TBDP
8.0000 mg | ORAL_TABLET | Freq: Once | ORAL | Status: AC
  Administered 2012-02-15: 8 mg via ORAL
  Filled 2012-02-15: qty 2

## 2012-02-15 NOTE — ED Notes (Signed)
Pt c/o central CP and left sided abdominal pain.  N/v since 1 pm this afternoon, Chest and abdominal pain since 7 pm this evening.  Dialysis pt, received dialysis today, MWF pt.

## 2012-02-16 ENCOUNTER — Emergency Department (HOSPITAL_COMMUNITY): Payer: 59

## 2012-02-16 ENCOUNTER — Inpatient Hospital Stay (HOSPITAL_COMMUNITY)
Admission: EM | Admit: 2012-02-16 | Discharge: 2012-02-20 | DRG: 391 | Disposition: A | Discharge status: Home / Self Care | Payer: 59 | Attending: Family Medicine | Admitting: Family Medicine

## 2012-02-16 ENCOUNTER — Encounter (HOSPITAL_COMMUNITY): Payer: Self-pay | Admitting: *Deleted

## 2012-02-16 DIAGNOSIS — N186 End stage renal disease: Secondary | ICD-10-CM

## 2012-02-16 DIAGNOSIS — R1110 Vomiting, unspecified: Secondary | ICD-10-CM

## 2012-02-16 DIAGNOSIS — I10 Essential (primary) hypertension: Secondary | ICD-10-CM

## 2012-02-16 DIAGNOSIS — Z992 Dependence on renal dialysis: Secondary | ICD-10-CM

## 2012-02-16 DIAGNOSIS — Z9884 Bariatric surgery status: Secondary | ICD-10-CM

## 2012-02-16 DIAGNOSIS — R109 Unspecified abdominal pain: Secondary | ICD-10-CM

## 2012-02-16 DIAGNOSIS — R1115 Cyclical vomiting syndrome unrelated to migraine: Secondary | ICD-10-CM

## 2012-02-16 DIAGNOSIS — R112 Nausea with vomiting, unspecified: Secondary | ICD-10-CM

## 2012-02-16 HISTORY — DX: Essential (primary) hypertension: I10

## 2012-02-16 HISTORY — DX: Thyrotoxicosis, unspecified without thyrotoxic crisis or storm: E05.90

## 2012-02-16 HISTORY — DX: Dependence on renal dialysis: Z99.2

## 2012-02-16 HISTORY — DX: End stage renal disease: N18.6

## 2012-02-16 LAB — CREATININE, SERUM
Creatinine, Ser: 10.3 mg/dL — ABNORMAL HIGH (ref 0.50–1.35)
GFR calc Af Amer: 6 mL/min — ABNORMAL LOW (ref >90)
GFR calc non Af Amer: 5 mL/min — ABNORMAL LOW (ref >90)

## 2012-02-16 LAB — CBC
Hemoglobin: 13.3 g/dL (ref 13.0–17.0)
MCH: 31 pg (ref 26.0–34.0)
RBC: 4.29 MIL/uL (ref 4.22–5.81)
WBC: 15.4 10*3/uL — ABNORMAL HIGH (ref 4.0–10.5)

## 2012-02-16 LAB — GLUCOSE, CAPILLARY: Glucose-Capillary: 129 mg/dL — ABNORMAL HIGH (ref 70–99)

## 2012-02-16 MED ORDER — CINACALCET HCL 30 MG PO TABS
60.0000 mg | ORAL_TABLET | Freq: Every day | ORAL | Status: DC
  Administered 2012-02-18 – 2012-02-19 (×2): 60 mg via ORAL
  Filled 2012-02-16 (×5): qty 2

## 2012-02-16 MED ORDER — CALCIUM ACETATE 667 MG PO CAPS
2001.0000 mg | ORAL_CAPSULE | Freq: Three times a day (TID) | ORAL | Status: DC
  Administered 2012-02-17: 13:00:00 via ORAL
  Administered 2012-02-17 – 2012-02-20 (×2): 2001 mg via ORAL
  Filled 2012-02-16 (×13): qty 3

## 2012-02-16 MED ORDER — ONDANSETRON HCL 4 MG PO TABS: 4.0000 mg | ORAL_TABLET | Freq: Four times a day (QID) | ORAL | Status: DC | PRN

## 2012-02-16 MED ORDER — ONDANSETRON HCL 4 MG/2ML IJ SOLN
4.0000 mg | Freq: Four times a day (QID) | INTRAMUSCULAR | Status: DC | PRN
  Administered 2012-02-16 – 2012-02-18 (×4): 4 mg via INTRAVENOUS
  Filled 2012-02-16 (×3): qty 2

## 2012-02-16 MED ORDER — SODIUM CHLORIDE 0.9 % IJ SOLN
3.0000 mL | INTRAMUSCULAR | Status: DC | PRN
  Administered 2012-02-17: 3 mL via INTRAVENOUS

## 2012-02-16 MED ORDER — SENNOSIDES-DOCUSATE SODIUM 8.6-50 MG PO TABS
1.0000 | ORAL_TABLET | Freq: Every evening | ORAL | Status: DC | PRN
  Filled 2012-02-16: qty 1

## 2012-02-16 MED ORDER — INSULIN ASPART 100 UNIT/ML ~~LOC~~ SOLN: 0.0000 [IU] | Freq: Three times a day (TID) | SUBCUTANEOUS | Status: DC

## 2012-02-16 MED ORDER — ACETAMINOPHEN 325 MG PO TABS: 650.0000 mg | ORAL_TABLET | Freq: Four times a day (QID) | ORAL | Status: DC | PRN

## 2012-02-16 MED ORDER — EZETIMIBE 10 MG PO TABS
10.0000 mg | ORAL_TABLET | Freq: Every day | ORAL | Status: DC
  Administered 2012-02-18 – 2012-02-19 (×2): 10 mg via ORAL
  Filled 2012-02-16 (×5): qty 1

## 2012-02-16 MED ORDER — METOCLOPRAMIDE HCL 5 MG/ML IJ SOLN
10.0000 mg | Freq: Once | INTRAMUSCULAR | Status: AC
  Administered 2012-02-16: 10 mg via INTRAVENOUS
  Filled 2012-02-16: qty 2

## 2012-02-16 MED ORDER — LANTHANUM CARBONATE 500 MG PO CHEW
2000.0000 mg | CHEWABLE_TABLET | Freq: Three times a day (TID) | ORAL | Status: DC
  Administered 2012-02-17 – 2012-02-20 (×3): 2000 mg via ORAL
  Filled 2012-02-16 (×13): qty 4

## 2012-02-16 MED ORDER — ONDANSETRON HCL 4 MG/2ML IJ SOLN
4.0000 mg | Freq: Once | INTRAMUSCULAR | Status: AC
  Administered 2012-02-16: 4 mg via INTRAVENOUS
  Filled 2012-02-16: qty 2

## 2012-02-16 MED ORDER — HYDROMORPHONE HCL PF 1 MG/ML IJ SOLN
1.0000 mg | Freq: Once | INTRAMUSCULAR | Status: AC
  Administered 2012-02-16: 1 mg via INTRAVENOUS
  Filled 2012-02-16: qty 1

## 2012-02-16 MED ORDER — SODIUM CHLORIDE 0.9 % IV SOLN
Freq: Once | INTRAVENOUS | Status: AC
  Administered 2012-02-16: 10:00:00 via INTRAVENOUS

## 2012-02-16 MED ORDER — INSULIN ASPART 100 UNIT/ML ~~LOC~~ SOLN: 4.0000 [IU] | Freq: Three times a day (TID) | SUBCUTANEOUS | Status: DC

## 2012-02-16 MED ORDER — GABAPENTIN 100 MG PO CAPS
100.0000 mg | ORAL_CAPSULE | Freq: Every day | ORAL | Status: DC
  Administered 2012-02-18 – 2012-02-19 (×2): 100 mg via ORAL
  Filled 2012-02-16 (×5): qty 1

## 2012-02-16 MED ORDER — ONDANSETRON HCL 4 MG/2ML IJ SOLN: 4.0000 mg | Freq: Three times a day (TID) | INTRAMUSCULAR | Status: AC | PRN

## 2012-02-16 MED ORDER — SODIUM CHLORIDE 0.9 % IV BOLUS (SEPSIS): 1000.0000 mL | Freq: Once | INTRAVENOUS | Status: DC

## 2012-02-16 MED ORDER — HYDROMORPHONE HCL PF 1 MG/ML IJ SOLN
1.0000 mg | INTRAMUSCULAR | Status: AC | PRN
  Administered 2012-02-16: 1 mg via INTRAVENOUS
  Filled 2012-02-16: qty 1

## 2012-02-16 MED ORDER — HYDROMORPHONE HCL PF 1 MG/ML IJ SOLN
1.0000 mg | Freq: Once | INTRAMUSCULAR | Status: DC
  Filled 2012-02-16: qty 1

## 2012-02-16 MED ORDER — ACETAMINOPHEN 650 MG RE SUPP: 650.0000 mg | Freq: Four times a day (QID) | RECTAL | Status: DC | PRN

## 2012-02-16 MED ORDER — IOHEXOL 300 MG/ML  SOLN
80.0000 mL | Freq: Once | INTRAMUSCULAR | Status: AC | PRN
  Administered 2012-02-16: 80 mL via INTRAVENOUS

## 2012-02-16 MED ORDER — FENTANYL CITRATE 0.05 MG/ML IJ SOLN
50.0000 ug | Freq: Once | INTRAMUSCULAR | Status: AC
  Administered 2012-02-16: 50 ug via INTRAVENOUS
  Filled 2012-02-16: qty 2

## 2012-02-16 MED ORDER — IOHEXOL 300 MG/ML  SOLN
20.0000 mL | INTRAMUSCULAR | Status: DC
  Administered 2012-02-16: 20 mL via ORAL

## 2012-02-16 MED ORDER — CALCIUM ACETATE 667 MG PO CAPS
1334.0000 mg | ORAL_CAPSULE | Freq: Two times a day (BID) | ORAL | Status: DC
  Filled 2012-02-16 (×4): qty 2

## 2012-02-16 MED ORDER — ENOXAPARIN SODIUM 40 MG/0.4ML ~~LOC~~ SOLN
40.0000 mg | SUBCUTANEOUS | Status: DC
  Administered 2012-02-16 – 2012-02-17 (×2): 40 mg via SUBCUTANEOUS
  Filled 2012-02-16 (×3): qty 0.4

## 2012-02-16 MED ORDER — MORPHINE SULFATE 4 MG/ML IJ SOLN
4.0000 mg | Freq: Once | INTRAMUSCULAR | Status: AC
Start: 2012-02-16 — End: 2012-02-16
  Administered 2012-02-16: 4 mg via INTRAVENOUS
  Filled 2012-02-16: qty 1

## 2012-02-16 MED ORDER — ONDANSETRON HCL 4 MG/2ML IJ SOLN
4.0000 mg | Freq: Once | INTRAMUSCULAR | Status: DC
  Filled 2012-02-16: qty 2

## 2012-02-16 MED ORDER — PANTOPRAZOLE SODIUM 40 MG IV SOLR
40.0000 mg | INTRAVENOUS | Status: DC
  Administered 2012-02-16: 40 mg via INTRAVENOUS
  Filled 2012-02-16 (×2): qty 40

## 2012-02-16 MED ORDER — CALCIUM ACETATE 667 MG PO CAPS: 1334.0000 mg | ORAL_CAPSULE | Freq: Three times a day (TID) | ORAL | Status: DC

## 2012-02-16 MED ORDER — SODIUM CHLORIDE 0.9 % IJ SOLN
3.0000 mL | Freq: Two times a day (BID) | INTRAMUSCULAR | Status: DC
  Administered 2012-02-16 – 2012-02-19 (×6): 3 mL via INTRAVENOUS

## 2012-02-16 MED ORDER — SODIUM CHLORIDE 0.9 % IV SOLN: 250.0000 mL | INTRAVENOUS | Status: DC | PRN

## 2012-02-16 MED ORDER — CINACALCET HCL 30 MG PO TABS: 60.0000 mg | ORAL_TABLET | Freq: Every day | ORAL | Status: DC

## 2012-02-16 MED ORDER — MORPHINE SULFATE 2 MG/ML IJ SOLN
1.0000 mg | INTRAMUSCULAR | Status: DC | PRN
  Administered 2012-02-17: 1 mg via INTRAVENOUS
  Administered 2012-02-17: 0.5 mg via INTRAVENOUS
  Administered 2012-02-18 (×2): 1 mg via INTRAVENOUS
  Filled 2012-02-16 (×2): qty 1

## 2012-02-16 MED ORDER — SODIUM CHLORIDE 0.9 % IV BOLUS (SEPSIS)
500.0000 mL | Freq: Once | INTRAVENOUS | Status: AC
  Administered 2012-02-16: 500 mL via INTRAVENOUS

## 2012-02-16 NOTE — ED Provider Notes (Signed)
CT and KUB show no bowel obstruction or complications to LAP band.  If pt's symptoms continue, will consult [facility] for admission for persistent pain and persistent vomiting.  Discussed with Dr. Donell Beers.  She asks that message be sent to Dr. Daphine Deutscher if discharged.    Gavin Pound. Juan Kissoon, MD 02/16/12 1212

## 2012-02-16 NOTE — ED Notes (Signed)
Patient is a dialysis and is normally dialyzed on Monday, Wednesday and Friday.  He says they took 3lbs off on Friday.  He intially was having chest pain, however, after he was given nitroglycerin in triage his chest pain subsided.  Now he is complaining of severe abdominal pain.

## 2012-02-16 NOTE — ED Notes (Signed)
Pt reports that he cannot drink his oral contrast he just vomited the small amount he drank.  C/o abd pain increased by the vomiting.  edp notified.  Med  placed

## 2012-02-16 NOTE — ED Notes (Signed)
Family at bedside. 

## 2012-02-16 NOTE — ED Provider Notes (Signed)
History     CSN: [id number]  Arrival date & time 02/15/12  2133   First MD Initiated Contact with Patient 02/16/12 0300      Chief Complaint  Patient presents with  . Chest Pain    (Consider location/radiation/quality/duration/timing/severity/associated sxs/prior treatment) HPI  Past Medical History  Diagnosis Date  . Chronic kidney disease     kidney failure  . Nausea   . Vomiting   . Stomach cramps   . Fever and chills   . Hyperlipidemia   . Thyroid disorder     takes sensipar  . Diabetes mellitus     controlling by weight/diet  . 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 GERD (gastroesophageal reflux disease)     Past Surgical History  Procedure Date  . Gastric restriction surgery 12/06/09    lap band  . Dialysis fistula creation 2011  . Insertion of dialysis catheter 11/13/2011    Procedure: INSERTION OF DIALYSIS CATHETER;  Surgeon: Sherren Kerns, MD;  Location: [facility] OR;  Service: Vascular;  Laterality: Right;  Insertion of 23cm dialysis catheter in right IJ  . Av fistula placement 12/05/2011    Procedure: ARTERIOVENOUS (AV) FISTULA CREATION;  Surgeon: Larina Earthly, MD;  Location: [facility] OR;  Service: Vascular;  Laterality: Left;    Family History  Problem Relation Age of Onset  . Hypertension Mother   . Diabetes Mother   . Hypertension Father   . Diabetes Father     History  Substance Use Topics  . Smoking status: Never Smoker   . Smokeless tobacco: Never Used  . Alcohol Use: No      Review of Systems  Allergies  Review of patient's allergies indicates no known allergies.  Home Medications   Current Outpatient Rx  Name  Route  Sig  Dispense  Refill  . CALCIUM ACETATE 667 MG PO CAPS   Oral   Take 1,334-2,001 mg by mouth 3 (three) times daily with meals. Take 3 capsules with each of 3 daily meals, and 2 capsules with each of 2 daily snacks.         Marland Kitchen CINACALCET HCL 60 MG PO TABS   Oral   Take 60 mg by mouth at bedtime.          Marland Kitchen  EZETIMIBE 10 MG PO TABS   Oral   Take 10 mg by mouth at bedtime.          Marland Kitchen GABAPENTIN 100 MG PO CAPS   Oral   Take 100 mg by mouth at bedtime.         Marland Kitchen HYDROCODONE-ACETAMINOPHEN 5-500 MG PO TABS   Oral   Take 1 tablet by mouth every 6 (six) hours as needed. For pain   30 tablet   0   . LANTHANUM CARBONATE 500 MG PO CHEW   Oral   Chew 2,000 mg by mouth 3 (three) times daily with meals.         Marland Kitchen ONDANSETRON HCL 4 MG PO TABS   Oral   Take 1 tablet (4 mg total) by mouth every 6 (six) hours as needed for nausea.   20 tablet   0   . PANTOPRAZOLE SODIUM 40 MG PO TBEC   Oral   Take 1 tablet (40 mg total) by mouth daily.   30 tablet   0     BP 165/96  Pulse 98  Temp 98 F (36.7 C) (Oral)  Resp 18  SpO2 100%  Physical Exam  ED Course  Procedures (including critical care time)  Labs Reviewed  CBC - Abnormal; Notable for the following:    RBC 4.15 (*)     Hemoglobin 12.8 (*)     All other components within normal limits  COMPREHENSIVE METABOLIC PANEL - Abnormal; Notable for the following:    Glucose, Bld 152 (*)     Creatinine, Ser 7.28 (*)     Calcium 10.9 (*)     Total Protein 8.4 (*)     GFR calc non Af Amer 8 (*)     GFR calc Af Amer 9 (*)     All other components within normal limits  LACTIC ACID, PLASMA - Abnormal; Notable for the following:    Lactic Acid, Venous 3.3 (*)     All other components within normal limits  GLUCOSE, CAPILLARY - Abnormal; Notable for the following:    Glucose-Capillary 129 (*)     All other components within normal limits  POCT I-STAT TROPONIN I  LIPASE, BLOOD   Dg Chest 2 View  02/15/2012  *RADIOLOGY REPORT*  Clinical Data: Abdominal pain and vomiting.  CHEST - 2 VIEW  Comparison: 01/14/2012.  Findings: Normal sized heart.  Clear lungs.  Stable right jugular double-lumen catheter.  Lower thoracic spine degenerative changes.  IMPRESSION: No acute abnormality.   Original Report Authenticated By: Beckie Salts, M.D.    Dg  Abd 1 View  02/16/2012  *RADIOLOGY REPORT*  Clinical Data: Right lower quadrant abdominal pain.  ABDOMEN - 1 VIEW  Comparison: Plain films chest and abdomen 01/14/2012 and CT abdomen and pelvis 01/15/2012.  Findings: A lap band is again seen and appears unchanged.  The bowel gas pattern is normal.  No free peritoneal air is identified. Suture material is noted in the left upper quadrant. Cholecystectomy clips are also identified.  IMPRESSION: No acute finding.   Original Report Authenticated By: Holley Dexter, M.D.    Ct Abdomen Pelvis W Contrast  02/16/2012  *RADIOLOGY REPORT*  Clinical Data: Central chest pain and left side abdominal pain. Nausea and vomiting.  Patient on dialysis.  CT ABDOMEN AND PELVIS WITH CONTRAST  Technique:  Multidetector CT imaging of the abdomen and pelvis was performed following the standard protocol during bolus administration of intravenous contrast.  Contrast: 80mL OMNIPAQUE IOHEXOL 300 MG/ML  SOLN  Comparison: CT abdomen and pelvis 01/15/2012.  Findings: Mild basilar atelectasis is noted.  No pleural or pericardial effusion.  A lap band is again identified and unchanged.  The patient is status post cholecystectomy.  Atrophic kidneys are noted.  The liver, spleen, right adrenal gland and pancreas appear normal. 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  A very few colonic diverticula are noted without diverticulitis.  Stomach, small bowel and appendix appear normal.  No lymphadenopathy or fluid. Small fat containing umbilical hernia noted.  There is no focal bony abnormality.  Small exostosis versus small enthesophyte off the posterior aspect the right left acetabulum is incidentally noted and unchanged.  IMPRESSION:  1.  No acute finding. 2.  Status post lap band placement and cholecystectomy. 3.  Chronic renal atrophy. 4.  Very small fat containing umbilical hernia.   Original Report Authenticated By: Holley Dexter, M.D.      No diagnosis found.    MDM   2:17 PM Pt feeling okay at this time:will trial on po and see if pt can go home 5:12 PM Pt is to be admitted to hospitalist  Teressa Lower, NP 02/16/12 1712

## 2012-02-16 NOTE — ED Notes (Signed)
Pt resting/ sleeping, aware of need for drinking 2 cups of contrast (at [facility]), c/o nausea, reglan given, also c/o abd pain. Arousable, alert, NAD, calm, interactive, resps e/u, speaking in clear complete sentences, LS CTA, denies sob. IVF infusing w.o. to gravity for 500cc bolus.

## 2012-02-16 NOTE — ED Notes (Signed)
IV team unable to start a line. RN made aware.

## 2012-02-16 NOTE — H&P (Signed)
[facility]          History and Physical    PCP:   [name]., MD   Chief Complaint:  Intractable nausea and vomiting  HPI: Randall [name] 46 y/o man with h/o ESRD on HD MWF presents with intractable nausea and vomiting that began yesterday morning. He is on the kidney transplant list and in order to maintain his status on it he has been asked to lose weight. Because of this he had a lap band procedure done 2 years ago and has been having intermittently severe n/v ever since. He has been advised by Dr. Daphine Deutscher to have it reversed but his transplant specialist is reluctant to do so. He last had one of these severe episodes last month and required admission for symptomatic relief. He has been given a trial of POs in the ED and has failed so we have been asked to admit him for further evaluation and management.  Allergies:  No Known Allergies    Past Medical History  Diagnosis Date  . Chronic kidney disease     kidney failure  . Nausea   . Vomiting   . Stomach cramps   . Fever and chills   . Hyperlipidemia   . Thyroid disorder     takes sensipar  . Diabetes mellitus     controlling by weight/diet  . 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 GERD (gastroesophageal reflux disease)     Past Surgical History  Procedure Date  . Gastric restriction surgery 12/06/09    lap band  . Dialysis fistula creation 2011  . Insertion of dialysis catheter 11/13/2011    Procedure: INSERTION OF DIALYSIS CATHETER;  Surgeon: Sherren Kerns, MD;  Location: [facility] OR;  Service: Vascular;  Laterality: Right;  Insertion of 23cm dialysis catheter in right IJ  . Av fistula placement 12/05/2011    Procedure: ARTERIOVENOUS (AV) FISTULA CREATION;  Surgeon: Larina Earthly, MD;  Location: [facility] OR;  Service: Vascular;  Laterality: Left;    Prior to Admission medications   Medication Sig Start Date End Date Taking? Authorizing Provider  calcium acetate (PHOSLO) 667 MG capsule Take  1,334-2,001 mg by mouth 3 (three) times daily with meals. Take 3 capsules with each of 3 daily meals, and 2 capsules with each of 2 daily snacks.   Yes Historical Provider, MD  cinacalcet (SENSIPAR) 60 MG tablet Take 60 mg by mouth at bedtime.    Yes Historical Provider, MD  ezetimibe (ZETIA) 10 MG tablet Take 10 mg by mouth at bedtime.    Yes Historical Provider, MD  gabapentin (NEURONTIN) 100 MG capsule Take 100 mg by mouth at bedtime.   Yes Historical Provider, MD  HYDROcodone-acetaminophen (VICODIN) 5-500 MG per tablet Take 1 tablet by mouth every 6 (six) hours as needed. For pain 01/23/12  Yes Amelia Jo Edgewood, PA  lanthanum (FOSRENOL) 500 MG chewable tablet Chew 2,000 mg by mouth 3 (three) times daily with meals.   Yes Historical Provider, MD  ondansetron (ZOFRAN) 4 MG tablet Take 1 tablet (4 mg total) by mouth every 6 (six) hours as needed for nausea. 01/17/12  Yes Shanker Levora Dredge, MD  pantoprazole (PROTONIX) 40 MG tablet Take 1 tablet (40 mg total) by mouth daily. 01/17/12  Yes Shanker Levora Dredge, MD    Social History:  reports that he has never smoked. He has never used smokeless tobacco. He reports that he does not drink alcohol or use illicit drugs.  Family History  Problem Relation Age of Onset  . Hypertension Mother   . Diabetes Mother   . Hypertension Father   . Diabetes Father     Review of Systems:  Constitutional: Denies fever, chills, diaphoresis, appetite change and fatigue.  HEENT: Denies photophobia, eye pain, redness, hearing loss, ear pain, congestion, sore throat, rhinorrhea, sneezing, mouth sores, trouble swallowing, neck pain, neck stiffness and tinnitus.   Respiratory: Denies SOB, DOE, cough, chest tightness,  and wheezing.   Cardiovascular: Denies chest pain, palpitations and leg swelling.  Gastrointestinal: Denies diarrhea, constipation, blood in stool and abdominal distention.  Genitourinary: Denies dysuria, urgency, frequency, hematuria, flank pain and  difficulty urinating.  Musculoskeletal: Denies myalgias, back pain, joint swelling, arthralgias and gait problem.  Skin: Denies pallor, rash and wound.  Neurological: Denies dizziness, seizures, syncope, weakness, light-headedness, numbness and headaches.  Hematological: Denies adenopathy. Easy bruising, personal or family bleeding history  Psychiatric/Behavioral: Denies suicidal ideation, mood changes, confusion, nervousness, sleep disturbance and agitation   Physical Exam: Blood pressure 165/96, pulse 98, temperature 98 F (36.7 C), temperature source Oral, resp. rate 18, SpO2 100.00%. Gen: AA Ox3 HEENT: Angie/AT/PERRL/dry mucous membranes Neck: supple, no JVD, no LAD, no bruits, no goiter. CV: RRR, no M/R/G Lungs: CTA B Abd: obese, S, NT,ND,+BS Ext: 1+ edema bilaterally. Neuro: grossly intact and non-focal   Labs on Admission:  Results for orders placed during the hospital encounter of 02/16/12 (from the past 48 hour(s))  CBC     Status: Abnormal   Collection Time   02/15/12 10:14 PM      Component Value Range Comment   WBC 10.5  4.0 - 10.5 K/uL    RBC 4.15 (*) 4.22 - 5.81 MIL/uL    Hemoglobin 12.8 (*) 13.0 - 17.0 g/dL    HCT 10.2  72.5 - 36.6 %    MCV 94.7  78.0 - 100.0 fL    MCH 30.8  26.0 - 34.0 pg    MCHC 32.6  30.0 - 36.0 g/dL    RDW 44.0  34.7 - 42.5 %    Platelets 175  150 - 400 K/uL   COMPREHENSIVE METABOLIC PANEL     Status: Abnormal   Collection Time   02/15/12 10:14 PM      Component Value Range Comment   Sodium 141  135 - 145 mEq/L    Potassium 3.5  3.5 - 5.1 mEq/L    Chloride 97  96 - 112 mEq/L    CO2 26  19 - 32 mEq/L    Glucose, Bld 152 (*) 70 - 99 mg/dL    BUN 17  6 - 23 mg/dL    Creatinine, Ser 9.56 (*) 0.50 - 1.35 mg/dL    Calcium 38.7 (*) 8.4 - 10.5 mg/dL    Total Protein 8.4 (*) 6.0 - 8.3 g/dL    Albumin 4.2  3.5 - 5.2 g/dL    AST 22  0 - 37 U/L    ALT 18  0 - 53 U/L    Alkaline Phosphatase 70  39 - 117 U/L    Total Bilirubin 0.5  0.3 - 1.2 mg/dL     GFR calc non Af Amer 8 (*) >90 mL/min    GFR calc Af Amer 9 (*) >90 mL/min   POCT I-STAT TROPONIN I     Status: Normal   Collection Time   02/15/12 10:46 PM      Component Value Range Comment   Troponin i, poc 0.02  0.00 - 0.08 ng/mL  Comment 3            LACTIC ACID, PLASMA     Status: Abnormal   Collection Time   02/16/12  3:41 AM      Component Value Range Comment   Lactic Acid, Venous 3.3 (*) 0.5 - 2.2 mmol/L   LIPASE, BLOOD     Status: Normal   Collection Time   02/16/12  3:41 AM      Component Value Range Comment   Lipase 14  11 - 59 U/L   GLUCOSE, CAPILLARY     Status: Abnormal   Collection Time   02/16/12  7:18 AM      Component Value Range Comment   Glucose-Capillary 129 (*) 70 - 99 mg/dL     Radiological Exams on Admission: Dg Chest 2 View  02/15/2012  *RADIOLOGY REPORT*  Clinical Data: Abdominal pain and vomiting.  CHEST - 2 VIEW  Comparison: 01/14/2012.  Findings: Normal sized heart.  Clear lungs.  Stable right jugular double-lumen catheter.  Lower thoracic spine degenerative changes.  IMPRESSION: No acute abnormality.   Original Report Authenticated By: Beckie Salts, M.D.    Dg Abd 1 View  02/16/2012  *RADIOLOGY REPORT*  Clinical Data: Right lower quadrant abdominal pain.  ABDOMEN - 1 VIEW  Comparison: Plain films chest and abdomen 01/14/2012 and CT abdomen and pelvis 01/15/2012.  Findings: A lap band is again seen and appears unchanged.  The bowel gas pattern is normal.  No free peritoneal air is identified. Suture material is noted in the left upper quadrant. Cholecystectomy clips are also identified.  IMPRESSION: No acute finding.   Original Report Authenticated By: Holley Dexter, M.D.    Ct Abdomen Pelvis W Contrast  02/16/2012  *RADIOLOGY REPORT*  Clinical Data: Central chest pain and left side abdominal pain. Nausea and vomiting.  Patient on dialysis.  CT ABDOMEN AND PELVIS WITH CONTRAST  Technique:  Multidetector CT imaging of the abdomen and pelvis was  performed following the standard protocol during bolus administration of intravenous contrast.  Contrast: 80mL OMNIPAQUE IOHEXOL 300 MG/ML  SOLN  Comparison: CT abdomen and pelvis 01/15/2012.  Findings: Mild basilar atelectasis is noted.  No pleural or pericardial effusion.  A lap band is again identified and unchanged.  The patient is status post cholecystectomy.  Atrophic kidneys are noted.  The liver, spleen, right adrenal gland and pancreas appear normal. 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  A very few colonic diverticula are noted without diverticulitis.  Stomach, small bowel and appendix appear normal.  No lymphadenopathy or fluid. Small fat containing umbilical hernia noted.  There is no focal bony abnormality.  Small exostosis versus small enthesophyte off the posterior aspect the right left acetabulum is incidentally noted and unchanged.  IMPRESSION:  1.  No acute finding. 2.  Status post lap band placement and cholecystectomy. 3.  Chronic renal atrophy. 4.  Very small fat containing umbilical hernia.   Original Report Authenticated By: Holley Dexter, M.D.     Assessment/Plan Principal Problem:  *Intractable nausea and vomiting Active Problems:  ESRD (end stage renal disease) on dialysis  Lapband APL over bypass Sept 2011  Hypertension   Intractable N/V -Recurrent issue for him ever since his lap band procedure 2 years ago. -Admit for symptomatic treatment. -Antiemetics, PPI. -Has already received 1 L of IVF in the ED; will not give any more as he is a renal patient.  ESRD -on HD MWF. -Renal (Dr. Arlean Hopping) has been notified.  HTN -Follow and  treat as needed.  DM II -Is currently diet controlled. -Has not required insulin since his significant weight loss. -Check A1c. -Place on SSI.  DVT Prophylaxis -Lovenox  Code Status -Full code.    Time Spent on Admission: 70 minutes  HERNANDEZ ACOSTA,Myalee Stengel [facility] Pager: [phone] 02/16/2012, 5:18  PM

## 2012-02-16 NOTE — ED Notes (Signed)
Patient has two fistulas one current not being used yet in the left upper arm.  The other in the left lower arm, near wrist.  Positive for bruit and thrill.

## 2012-02-16 NOTE — ED Notes (Signed)
Patient transported to CT 

## 2012-02-17 ENCOUNTER — Encounter (HOSPITAL_COMMUNITY): Payer: Self-pay | Admitting: Nephrology

## 2012-02-17 LAB — BASIC METABOLIC PANEL
GFR calc Af Amer: 5 mL/min — ABNORMAL LOW (ref >90)
GFR calc non Af Amer: 4 mL/min — ABNORMAL LOW (ref >90)
Potassium: 3.8 mEq/L (ref 3.5–5.1)
Sodium: 140 mEq/L (ref 135–145)

## 2012-02-17 LAB — GLUCOSE, CAPILLARY
Glucose-Capillary: 154 mg/dL — ABNORMAL HIGH (ref 70–99)
Glucose-Capillary: 85 mg/dL (ref 70–99)

## 2012-02-17 LAB — HEMOGLOBIN A1C: Mean Plasma Glucose: 105 mg/dL (ref <117)

## 2012-02-17 LAB — CBC
HCT: 34.6 % — ABNORMAL LOW (ref 39.0–52.0)
Hemoglobin: 11 g/dL — ABNORMAL LOW (ref 13.0–17.0)
WBC: 11 10*3/uL — ABNORMAL HIGH (ref 4.0–10.5)

## 2012-02-17 MED ORDER — HYDROMORPHONE HCL PF 1 MG/ML IJ SOLN
2.0000 mg | Freq: Once | INTRAMUSCULAR | Status: AC
  Administered 2012-02-17: 1 mg via INTRAVENOUS
  Filled 2012-02-17: qty 1

## 2012-02-17 MED ORDER — PANTOPRAZOLE SODIUM 40 MG PO TBEC
40.0000 mg | DELAYED_RELEASE_TABLET | Freq: Every day | ORAL | Status: DC
  Administered 2012-02-17 – 2012-02-19 (×3): 40 mg via ORAL
  Filled 2012-02-17 (×2): qty 1

## 2012-02-17 NOTE — Progress Notes (Signed)
[facility]             Progress Note   Subjective: Feels much better. Has tolerated a clear liquid diet this morning without issues.  Objective: Vital signs in last 24 hours: Temp:  [97.8 F (36.6 C)-98.2 F (36.8 C)] 97.8 F (36.6 C) (12/08 0519) Pulse Rate:  [76-97] 76  (12/08 0519) Resp:  [18] 18  (12/08 0519) BP: (132-165)/(75-100) 132/75 mmHg (12/08 0519) SpO2:  [97 %-100 %] 97 % (12/08 0519) Weight:  [118.7 kg (261 lb 11 oz)] 118.7 kg (261 lb 11 oz) (12/07 2130) Weight change:  Last BM Date: 02/16/12  Intake/Output from previous day: 12/07 0701 - 12/08 0700 In: 240 [P.O.:240] Out: -      Physical Exam: General: Alert, awake, oriented x3, in no acute distress. HEENT: No bruits, no goiter. Heart: Regular rate and rhythm, without murmurs, rubs, gallops. Lungs: Clear to auscultation bilaterally. Abdomen: Soft, nontender, nondistended, positive bowel sounds. Extremities: No clubbing cyanosis or edema with positive pedal pulses. Neuro: Grossly intact, nonfocal.    Lab Results: Basic Metabolic Panel:  Basename 02/17/12 0610 02/16/12 2121 02/15/12 2214  NA 140 -- 141  K 3.8 -- 3.5  CL 97 -- 97  CO2 27 -- 26  GLUCOSE 96 -- 152*  BUN 29* -- 17  CREATININE 11.76* 10.30* --  CALCIUM 10.7* -- 10.9*  MG -- -- --  PHOS -- -- --   Liver Function Tests:  [facility] 02/15/12 2214  AST 22  ALT 18  ALKPHOS 70  BILITOT 0.5  PROT 8.4*  ALBUMIN 4.2    Basename 02/16/12 0341  LIPASE 14  AMYLASE --   CBC:  Basename 02/17/12 0610 02/16/12 2121  WBC 11.0* 15.4*  NEUTROABS -- --  HGB 11.0* 13.3  HCT 34.6* 41.5  MCV 97.2 96.7  PLT 183 193   CBG:  Basename 02/17/12 0839 02/16/12 0718  GLUCAP 154* 129*    Studies/Results: Dg Chest 2 View  02/15/2012  *RADIOLOGY REPORT*  Clinical Data: Abdominal pain and vomiting.  CHEST - 2 VIEW  Comparison: 01/14/2012.  Findings: Normal sized heart.  Clear lungs.  Stable right jugular double-lumen catheter.   Lower thoracic spine degenerative changes.  IMPRESSION: No acute abnormality.   Original Report Authenticated By: Beckie Salts, M.D.    Dg Abd 1 View  02/16/2012  *RADIOLOGY REPORT*  Clinical Data: Right lower quadrant abdominal pain.  ABDOMEN - 1 VIEW  Comparison: Plain films chest and abdomen 01/14/2012 and CT abdomen and pelvis 01/15/2012.  Findings: A lap band is again seen and appears unchanged.  The bowel gas pattern is normal.  No free peritoneal air is identified. Suture material is noted in the left upper quadrant. Cholecystectomy clips are also identified.  IMPRESSION: No acute finding.   Original Report Authenticated By: Holley Dexter, M.D.    Ct Abdomen Pelvis W Contrast  02/16/2012  *RADIOLOGY REPORT*  Clinical Data: Central chest pain and left side abdominal pain. Nausea and vomiting.  Patient on dialysis.  CT ABDOMEN AND PELVIS WITH CONTRAST  Technique:  Multidetector CT imaging of the abdomen and pelvis was performed following the standard protocol during bolus administration of intravenous contrast.  Contrast: 80mL OMNIPAQUE IOHEXOL 300 MG/ML  SOLN  Comparison: CT abdomen and pelvis 01/15/2012.  Findings: Mild basilar atelectasis is noted.  No pleural or pericardial effusion.  A lap band is again identified and unchanged.  The patient is status post cholecystectomy.  Atrophic kidneys are noted.  The liver, spleen,  right adrenal gland and pancreas appear normal. 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  A very few colonic diverticula are noted without diverticulitis.  Stomach, small bowel and appendix appear normal.  No lymphadenopathy or fluid. Small fat containing umbilical hernia noted.  There is no focal bony abnormality.  Small exostosis versus small enthesophyte off the posterior aspect the right left acetabulum is incidentally noted and unchanged.  IMPRESSION:  1.  No acute finding. 2.  Status post lap band placement and cholecystectomy. 3.  Chronic renal atrophy. 4.  Very  small fat containing umbilical hernia.   Original Report Authenticated By: Holley Dexter, M.D.     Medications: Scheduled Meds:   . [COMPLETED] sodium chloride   Intravenous Once  . calcium acetate  1,334 mg Oral BID BM  . calcium acetate  2,001 mg Oral TID WC  . cinacalcet  60 mg Oral QHS  . enoxaparin (LOVENOX) injection  40 mg Subcutaneous Q24H  . ezetimibe  10 mg Oral QHS  . gabapentin  100 mg Oral QHS  . [COMPLETED]  HYDROmorphone (DILAUDID) injection  1 mg Intravenous Once  . insulin aspart  0-15 Units Subcutaneous TID WC  . insulin aspart  4 Units Subcutaneous TID WC  . lanthanum  2,000 mg Oral TID WC  . [COMPLETED] ondansetron  4 mg Intravenous Once  . pantoprazole (PROTONIX) IV  40 mg Intravenous Q24H  . [COMPLETED] sodium chloride  500 mL Intravenous Once  . sodium chloride  3 mL Intravenous Q12H  . [DISCONTINUED] calcium acetate  1,334-2,001 mg Oral TID WC  . [DISCONTINUED] cinacalcet  60 mg Oral QHS  . [DISCONTINUED]  HYDROmorphone (DILAUDID) injection  1 mg Intravenous Once  . [DISCONTINUED] iohexol  20 mL Oral Q1 Hr x 2  . [DISCONTINUED] ondansetron  4 mg Intravenous Once   Continuous Infusions:  PRN Meds:.sodium chloride, acetaminophen, acetaminophen, [EXPIRED]  HYDROmorphone (DILAUDID) injection, [COMPLETED] iohexol, morphine injection, [EXPIRED] ondansetron (ZOFRAN) IV, ondansetron (ZOFRAN) IV, ondansetron, senna-docusate, sodium chloride, [DISCONTINUED] nitroGLYCERIN  Assessment/Plan:  Principal Problem:  *Intractable nausea and vomiting Active Problems:  ESRD (end stage renal disease) on dialysis  Lapband APL over bypass Sept 2011  Hypertension   Intractable N/V -Much improved this am after symptomatic treatment. -Continue antiemetics, PPI. -Advance diet as tolerated.  ESRD -MWF. -Renal on board.  DM-II -Well controlled. -Continue current regimen.  HTN -Well controlled.  Disposition -Potential DC home in am after HD if continues to  improve.   Time spent coordinating care: 35 minutes.   LOS: 1 day   [facility] Pager: [phone] 02/17/2012, 8:54 AM

## 2012-02-17 NOTE — Consult Note (Signed)
[name] Renal Consultation Note  Indication for Consultation:  Management of ESRD/hemodialysis; anemia, hypertension/volume and secondary hyperparathyroidism  HPI: [name] is a 46 y.o. male admitted last pm for intractable nausea and vomiting .He is on the kidney transplant list with obesity and  he had a lap band procedure done Sept. 2011 and has been having intermittently severe n/v ever since. He has been advised by Dr. Daphine Deutscher to have it reversed but his transplant specialist is reluctant to do so. He  also had  one of these severe episodes last month and required admission 11/04 to 01/17/12 for symptomatic relief. He was given a trial of POs in the ED and  failed  With N/V thus [facility].admitted for further evaluation and management.  He reports no problems his last op hd Friday  Dec.06, denies fever, chills, or sweats. SOB and CP yesterday resolved when he arrived to the ER . He attributes it to "anxiety/ Stress".    Now in  Room and has tolerated his liquid breakfast. No gi symptoms now.            Past Medical History  Diagnosis Date  . Chronic kidney disease     kidney failure  . Nausea   . Vomiting   . Stomach cramps   . Fever and chills   . Hyperlipidemia   . Thyroid disorder     takes sensipar  . Diabetes mellitus     controlling by weight/diet  . 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 GERD (gastroesophageal reflux disease)   . Hypertension   . Hyperthyroidism     Past Surgical History  Procedure Date  . Gastric restriction surgery 12/06/09    lap band  . Dialysis fistula creation 2011  . Insertion of dialysis catheter 11/13/2011    Procedure: INSERTION OF DIALYSIS CATHETER;  Surgeon: Sherren Kerns, MD;  Location: [facility] OR;  Service: Vascular;  Laterality: Right;  Insertion of 23cm dialysis catheter in right IJ  . Av fistula placement 12/05/2011    Procedure: ARTERIOVENOUS (AV) FISTULA CREATION;  Surgeon: Larina Earthly, MD;   Location: [facility] OR;  Service: Vascular;  Laterality: Left;      Family History  Problem Relation Age of Onset  . Hypertension Mother   . Diabetes Mother   . Hypertension Father   . Diabetes Father    Social= He lives at home , working form his house in [place] [employer] and   reports that he has never smoked. He has never used smokeless tobacco. He reports that he does not drink alcohol or use illicit drugs.  No Known Allergies  Prior to Admission medications   Medication Sig Start Date End Date Taking? Authorizing Provider  calcium acetate (PHOSLO) 667 MG capsule Take 1,334-2,001 mg by mouth 3 (three) times daily with meals. Take 3 capsules with each of 3 daily meals, and 2 capsules with each of 2 daily snacks.   Yes Historical Provider, MD  cinacalcet (SENSIPAR) 60 MG tablet Take 60 mg by mouth at bedtime.    Yes Historical Provider, MD  ezetimibe (ZETIA) 10 MG tablet Take 10 mg by mouth at bedtime.    Yes Historical Provider, MD  gabapentin (NEURONTIN) 100 MG capsule Take 100 mg by mouth at bedtime.   Yes Historical Provider, MD  HYDROcodone-acetaminophen (VICODIN) 5-500 MG per tablet Take 1 tablet by mouth every 6 (six) hours as needed. For pain 01/23/12  Yes [place], [place]  lanthanum (FOSRENOL) 500 MG chewable tablet Chew 2,000 mg by mouth 3 (three) times daily with meals.   Yes Historical Provider, MD  ondansetron (ZOFRAN) 4 MG tablet Take 1 tablet (4 mg total) by mouth every 6 (six) hours as needed for nausea. 01/17/12  Yes Shanker Levora Dredge, MD  pantoprazole (PROTONIX) 40 MG tablet Take 1 tablet (40 mg total) by mouth daily. 01/17/12  Yes Shanker Levora Dredge, MD     Anti-infectives    None      Results for orders placed during the hospital encounter of 02/16/12 (from the past 48 hour(s))  CBC     Status: Abnormal   Collection Time   02/15/12 10:14 PM      Component Value Range Comment   WBC 10.5  4.0 - 10.5 K/uL    RBC 4.15 (*) 4.22 - 5.81 MIL/uL    Hemoglobin 12.8  (*) 13.0 - 17.0 g/dL    HCT 82.9  56.2 - 13.0 %    MCV 94.7  78.0 - 100.0 fL    MCH 30.8  26.0 - 34.0 pg    MCHC 32.6  30.0 - 36.0 g/dL    RDW 86.5  78.4 - 69.6 %    Platelets 175  150 - 400 K/uL   COMPREHENSIVE METABOLIC PANEL     Status: Abnormal   Collection Time   02/15/12 10:14 PM      Component Value Range Comment   Sodium 141  135 - 145 mEq/L    Potassium 3.5  3.5 - 5.1 mEq/L    Chloride 97  96 - 112 mEq/L    CO2 26  19 - 32 mEq/L    Glucose, Bld 152 (*) 70 - 99 mg/dL    BUN 17  6 - 23 mg/dL    Creatinine, Ser 2.95 (*) 0.50 - 1.35 mg/dL    Calcium 28.4 (*) 8.4 - 10.5 mg/dL    Total Protein 8.4 (*) 6.0 - 8.3 g/dL    Albumin 4.2  3.5 - 5.2 g/dL    AST 22  0 - 37 U/L    ALT 18  0 - 53 U/L    Alkaline Phosphatase 70  39 - 117 U/L    Total Bilirubin 0.5  0.3 - 1.2 mg/dL    GFR calc non Af Amer 8 (*) >90 mL/min    GFR calc Af Amer 9 (*) >90 mL/min   POCT I-STAT TROPONIN I     Status: Normal   Collection Time   02/15/12 10:46 PM      Component Value Range Comment   Troponin i, poc 0.02  0.00 - 0.08 ng/mL    Comment 3            LACTIC ACID, PLASMA     Status: Abnormal   Collection Time   02/16/12  3:41 AM      Component Value Range Comment   Lactic Acid, Venous 3.3 (*) 0.5 - 2.2 mmol/L   LIPASE, BLOOD     Status: Normal   Collection Time   02/16/12  3:41 AM      Component Value Range Comment   Lipase 14  11 - 59 U/L   GLUCOSE, CAPILLARY     Status: Abnormal   Collection Time   02/16/12  7:18 AM      Component Value Range Comment   Glucose-Capillary 129 (*) 70 - 99 mg/dL   CBC     Status: Abnormal   Collection Time  02/16/12  9:21 PM      Component Value Range Comment   WBC 15.4 (*) 4.0 - 10.5 K/uL    RBC 4.29  4.22 - 5.81 MIL/uL    Hemoglobin 13.3  13.0 - 17.0 g/dL    HCT 95.6  21.3 - 08.6 %    MCV 96.7  78.0 - 100.0 fL    MCH 31.0  26.0 - 34.0 pg    MCHC 32.0  30.0 - 36.0 g/dL    RDW 57.8  46.9 - 62.9 %    Platelets 193  150 - 400 K/uL   CREATININE, SERUM      Status: Abnormal   Collection Time   02/16/12  9:21 PM      Component Value Range Comment   Creatinine, Ser 10.30 (*) 0.50 - 1.35 mg/dL    GFR calc non Af Amer 5 (*) >90 mL/min    GFR calc Af Amer 6 (*) >90 mL/min   BASIC METABOLIC PANEL     Status: Abnormal   Collection Time   02/17/12  6:10 AM      Component Value Range Comment   Sodium 140  135 - 145 mEq/L    Potassium 3.8  3.5 - 5.1 mEq/L    Chloride 97  96 - 112 mEq/L    CO2 27  19 - 32 mEq/L    Glucose, Bld 96  70 - 99 mg/dL    BUN 29 (*) 6 - 23 mg/dL    Creatinine, Ser 52.84 (*) 0.50 - 1.35 mg/dL    Calcium 13.2 (*) 8.4 - 10.5 mg/dL    GFR calc non Af Amer 4 (*) >90 mL/min    GFR calc Af Amer 5 (*) >90 mL/min   CBC     Status: Abnormal   Collection Time   02/17/12  6:10 AM      Component Value Range Comment   WBC 11.0 (*) 4.0 - 10.5 K/uL    RBC 3.56 (*) 4.22 - 5.81 MIL/uL    Hemoglobin 11.0 (*) 13.0 - 17.0 g/dL    HCT 44.0 (*) 10.2 - 52.0 %    MCV 97.2  78.0 - 100.0 fL    MCH 30.9  26.0 - 34.0 pg    MCHC 31.8  30.0 - 36.0 g/dL    RDW 72.5  36.6 - 44.0 %    Platelets 183  150 - 400 K/uL   GLUCOSE, CAPILLARY     Status: Abnormal   Collection Time   02/17/12  8:39 AM      Component Value Range Comment   Glucose-Capillary 154 (*) 70 - 99 mg/dL      ROS: see positives in HPI only   Physical Exam: Filed Vitals:   02/17/12 0948  BP: 128/75  Pulse: 87  Temp: 97.8 F (36.6 C)  Resp: 18     General: Alert. Obese bm, NAD, Appropriate , Pleasant HEENT: [place] , MMM Eyes: eomi Neck: Supple, no jvd Heart: RRR, no rub or Murmur Lungs:  CTA bilaterally ,no wheezing or rales Abdomen:  Obese, bs +=, non tender, soft Extremities: No pedal edema Skin:  No rash or overt ulcers Neuro:  OX3, no focal deficits Dialysis Access: pos. Bruit  Left Upper Arm AVF  Dialysis Orders: Center: [facility]  on MWF 1st shift .  Will call in am for records EDW / HD Bath /  Time / Heparin /. Access / BFR / DFR /    Zemplar / mcg  IV/HD Epogen /    Units IV/HD  Venofer  /  Other /  Assessment/Plan 1. Intractable Nausea and Vomiting= improving this am  Tolerating liquids. Per admit team advancing diet. 2. ESRD -  MWF [facility] schedule= am hd  Keep wt even  With n/v and below edw/ bp stable. Stable cxr on admit 3. Hypertension/volume  - No BP med's and stable bp/ he tells me thinks edw =121kg   Wt 119 this am / no uf as above with am hd. 4. Anemia  - 11.0 hgb , fu pre hd/  5. Metabolic bone disease -  Ca 10.7 use/binders with meals and check med's from kidney center in am  For zemplar dosiing 6. Nutrition - 4.2 alb./ high protein renal diet  7. DM Type 2 -per admit 8.    Obesity- Sp Lap band surgery for wt reduction ,awaitng wt loss for kidney transplant  Lenny Pastel, PA-C [facility] [phone] 02/17/2012, 11:51 AM   Patient seen and examined and agree with assessment and plan as above.  Vinson Moselle  MD [employer] [phone] pgr    [phone] cell 02/17/2012, 3:25 PM

## 2012-02-17 NOTE — ED Provider Notes (Signed)
History     CSN: [id number]  Arrival date & time 02/15/12  2133   First MD Initiated Contact with Patient 02/16/12 0300      Chief Complaint  Patient presents with  . Chest Pain    (Consider location/radiation/quality/duration/timing/severity/associated sxs/prior treatment) HPI 46 year old male presents to emergency department complaining of epigastric and left upper quadrant pain with radiation into his chest. Patient reports onset of pain around 11 AM after finishing dialysis. Pain is associated with vomiting. He denies any fever chills, he has had loose stools. He reports he has had similar symptoms in the past. Patient has had a lap band in place for 2 years. He reports Dr. Daphine Deutscher is wanting to remove it, but he is on the waiting list for a kidney transplant. He reports pain comes in waves, crampy in nature causing his nausea and vomiting to worsen.    Past Medical History  Diagnosis Date  . Chronic kidney disease     kidney failure  . Nausea   . Vomiting   . Stomach cramps   . Fever and chills   . Hyperlipidemia   . Thyroid disorder     takes sensipar  . Diabetes mellitus     controlling by weight/diet  . 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 GERD (gastroesophageal reflux disease)   . Hypertension   . Hyperthyroidism     Past Surgical History  Procedure Date  . Gastric restriction surgery 12/06/09    lap band  . Dialysis fistula creation 2011  . Insertion of dialysis catheter 11/13/2011    Procedure: INSERTION OF DIALYSIS CATHETER;  Surgeon: Sherren Kerns, MD;  Location: [facility] OR;  Service: Vascular;  Laterality: Right;  Insertion of 23cm dialysis catheter in right IJ  . Av fistula placement 12/05/2011    Procedure: ARTERIOVENOUS (AV) FISTULA CREATION;  Surgeon: Larina Earthly, MD;  Location: [facility] OR;  Service: Vascular;  Laterality: Left;    Family History  Problem Relation Age of Onset  . Hypertension Mother   . Diabetes Mother   . Hypertension Father   .  Diabetes Father     History  Substance Use Topics  . Smoking status: Never Smoker   . Smokeless tobacco: Never Used  . Alcohol Use: No      Review of Systems Review of systems evaluated with the patient, and negative other than listed in history of present illness  Allergies  Review of patient's allergies indicates no known allergies.  Home Medications  No current outpatient prescriptions on file.  BP 132/75  Pulse 76  Temp 97.8 F (36.6 C) (Oral)  Resp 18  Ht 5\' 11"  (1.803 m)  Wt 261 lb 11 oz (118.7 kg)  BMI 36.50 kg/m2  SpO2 97%  Physical Exam  Nursing note and vitals reviewed. Constitutional: He is oriented to person, place, and time. He appears well-developed and well-nourished. He appears distressed.  HENT:  Head: Normocephalic and atraumatic.  Nose: Nose normal.  Mouth/Throat: Oropharynx is clear and moist.  Eyes: Conjunctivae normal and EOM are normal. Pupils are equal, round, and reactive to light.  Neck: Normal range of motion. Neck supple. No JVD present. No tracheal deviation present. No thyromegaly present.  Cardiovascular: Normal rate, regular rhythm, normal heart sounds and intact distal pulses.  Exam reveals no gallop and no friction rub.   No murmur heard. Pulmonary/Chest: Effort normal and breath sounds normal. No stridor. No respiratory distress. He has no wheezes. He has no rales.  He exhibits no tenderness.  Abdominal: Soft. He exhibits no distension and no mass. There is Tenderness: tender in epigastrium and left upper quadrant.. There is no rebound and no guarding.       Hyperactive bowel sounds  Musculoskeletal: Normal range of motion. He exhibits no edema and no tenderness.  Lymphadenopathy:    He has no cervical adenopathy.  Neurological: He is alert and oriented to person, place, and time. He exhibits normal muscle tone. Coordination normal.  Skin: Skin is warm and dry. No rash noted. No erythema. No pallor.  Psychiatric: He has a normal mood  and affect. His behavior is normal. Judgment and thought content normal.    ED Course  Procedures (including critical care time)  Labs Reviewed  CBC - Abnormal; Notable for the following:    RBC 4.15 (*)     Hemoglobin 12.8 (*)     All other components within normal limits  COMPREHENSIVE METABOLIC PANEL - Abnormal; Notable for the following:    Glucose, Bld 152 (*)     Creatinine, Ser 7.28 (*)     Calcium 10.9 (*)     Total Protein 8.4 (*)     GFR calc non Af Amer 8 (*)     GFR calc Af Amer 9 (*)     All other components within normal limits  LACTIC ACID, PLASMA - Abnormal; Notable for the following:    Lactic Acid, Venous 3.3 (*)     All other components within normal limits  GLUCOSE, CAPILLARY - Abnormal; Notable for the following:    Glucose-Capillary 129 (*)     All other components within normal limits  CBC - Abnormal; Notable for the following:    WBC 15.4 (*)     All other components within normal limits  CREATININE, SERUM - Abnormal; Notable for the following:    Creatinine, Ser 10.30 (*)     GFR calc non Af Amer 5 (*)     GFR calc Af Amer 6 (*)     All other components within normal limits  POCT I-STAT TROPONIN I  LIPASE, BLOOD  BASIC METABOLIC PANEL  CBC  HEMOGLOBIN A1C   Dg Chest 2 View  02/15/2012  *RADIOLOGY REPORT*  Clinical Data: Abdominal pain and vomiting.  CHEST - 2 VIEW  Comparison: 01/14/2012.  Findings: Normal sized heart.  Clear lungs.  Stable right jugular double-lumen catheter.  Lower thoracic spine degenerative changes.  IMPRESSION: No acute abnormality.   Original Report Authenticated By: Beckie Salts, M.D.    Dg Abd 1 View  02/16/2012  *RADIOLOGY REPORT*  Clinical Data: Right lower quadrant abdominal pain.  ABDOMEN - 1 VIEW  Comparison: Plain films chest and abdomen 01/14/2012 and CT abdomen and pelvis 01/15/2012.  Findings: A lap band is again seen and appears unchanged.  The bowel gas pattern is normal.  No free peritoneal air is identified.  Suture material is noted in the left upper quadrant. Cholecystectomy clips are also identified.  IMPRESSION: No acute finding.   Original Report Authenticated By: Holley Dexter, M.D.    Ct Abdomen Pelvis W Contrast  02/16/2012  *RADIOLOGY REPORT*  Clinical Data: Central chest pain and left side abdominal pain. Nausea and vomiting.  Patient on dialysis.  CT ABDOMEN AND PELVIS WITH CONTRAST  Technique:  Multidetector CT imaging of the abdomen and pelvis was performed following the standard protocol during bolus administration of intravenous contrast.  Contrast: 80mL OMNIPAQUE IOHEXOL 300 MG/ML  SOLN  Comparison: CT abdomen and  pelvis 01/15/2012.  Findings: Mild basilar atelectasis is noted.  No pleural or pericardial effusion.  A lap band is again identified and unchanged.  The patient is status post cholecystectomy.  Atrophic kidneys are noted.  The liver, spleen, right adrenal gland and pancreas appear normal. 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  A very few colonic diverticula are noted without diverticulitis.  Stomach, small bowel and appendix appear normal.  No lymphadenopathy or fluid. Small fat containing umbilical hernia noted.  There is no focal bony abnormality.  Small exostosis versus small enthesophyte off the posterior aspect the right left acetabulum is incidentally noted and unchanged.  IMPRESSION:  1.  No acute finding. 2.  Status post lap band placement and cholecystectomy. 3.  Chronic renal atrophy. 4.  Very small fat containing umbilical hernia.   Original Report Authenticated By: Holley Dexter, M.D.     Date: 02/17/2012  Rate: 99  Rhythm: normal sinus rhythm  QRS Axis: normal  Intervals: normal  ST/T Wave abnormalities: normal  Conduction Disutrbances:none  Narrative Interpretation:   Old EKG Reviewed: unchanged     1. Abdominal pain   2. Vomiting   3. ESRD (end stage renal disease) on dialysis   4. Hypertension   5. Intractable nausea and vomiting        MDM  46 year old male with upper abdominal pain nausea vomiting. Patient has lap band in place, concern for problems with the device. We'll get CT abdomen pelvis, labs. Will treat for nausea and vomiting.        Olivia Mackie, MD 02/17/12 [phone]

## 2012-02-17 NOTE — Progress Notes (Signed)
[id number]. Pt requested his diet be changed to carb mod; tolerated lunch but c/o abdominal pain and nausea  dinner time; he claims he just had about 1/4 of his dinner tray. Requested for clear liquid diet.

## 2012-02-18 ENCOUNTER — Observation Stay (HOSPITAL_COMMUNITY): Payer: 59

## 2012-02-18 DIAGNOSIS — Z9884 Bariatric surgery status: Secondary | ICD-10-CM

## 2012-02-18 DIAGNOSIS — R112 Nausea with vomiting, unspecified: Secondary | ICD-10-CM

## 2012-02-18 LAB — CBC
Hemoglobin: 12.1 g/dL — ABNORMAL LOW (ref 13.0–17.0)
MCH: 31.5 pg (ref 26.0–34.0)
MCHC: 33.9 g/dL (ref 30.0–36.0)
Platelets: 178 10*3/uL (ref 150–400)

## 2012-02-18 LAB — RENAL FUNCTION PANEL
Calcium: 10.7 mg/dL — ABNORMAL HIGH (ref 8.4–10.5)
Creatinine, Ser: 14.84 mg/dL — ABNORMAL HIGH (ref 0.50–1.35)
GFR calc Af Amer: 4 mL/min — ABNORMAL LOW (ref >90)
Glucose, Bld: 121 mg/dL — ABNORMAL HIGH (ref 70–99)
Phosphorus: 6.9 mg/dL — ABNORMAL HIGH (ref 2.3–4.6)
Sodium: 138 mEq/L (ref 135–145)

## 2012-02-18 LAB — GLUCOSE, CAPILLARY
Glucose-Capillary: 107 mg/dL — ABNORMAL HIGH (ref 70–99)
Glucose-Capillary: 92 mg/dL (ref 70–99)

## 2012-02-18 MED ORDER — ONDANSETRON HCL 4 MG/2ML IJ SOLN
INTRAMUSCULAR | Status: AC
  Administered 2012-02-18: 4 mg via INTRAVENOUS
  Filled 2012-02-18: qty 2

## 2012-02-18 MED ORDER — MORPHINE SULFATE 2 MG/ML IJ SOLN
INTRAMUSCULAR | Status: AC
  Filled 2012-02-18: qty 1

## 2012-02-18 MED ORDER — ENOXAPARIN SODIUM 30 MG/0.3ML ~~LOC~~ SOLN
30.0000 mg | SUBCUTANEOUS | Status: DC
  Administered 2012-02-18 – 2012-02-19 (×2): 30 mg via SUBCUTANEOUS
  Filled 2012-02-18 (×3): qty 0.3

## 2012-02-18 NOTE — Progress Notes (Signed)
[facility] Progress Note  Subjective:   Nauseated and has abdominal pain; wants lap band out  Objective Filed Vitals:   02/18/12 0728 02/18/12 0800 02/18/12 0830 02/18/12 0858  BP: 171/94 173/93 164/92 163/91  Pulse: 88 87 86 91  Temp:      TempSrc:      Resp:      Height:      Weight:      SpO2:       Physical Exam on HD goal 500 General: uncomfortable with hiccups and nausea Heart: RRR Lungs: no wheezes or rales Abdomen: obese soft; port palpable Extremities: no LE edema Dialysis Access: right I-J and left upper AVF patent (maturing)  Dialysis Orders: [facility] MWF - 200 Optiflux 450/800 2K 2.5 Ca EDW 120.5 left upper AVF and right I-J Zemplar 18 Epo 1000  Assessment/Plan: 1. Intractable N and V - intermittent, thought to have been related to lap band; primary to contact surgery  2. ESRD - MWF per routine - on 4 K bath due to K 3.8 3. Anemia - Hgb 12.1 - holding ESA 4. Secondary hyperparathyroidism - hypercalcemic at 10.7 on 2.5 Ca bath as outpt; Ca also in 10s as an oupt. On 4K bath with 2.25 Ca today.  Will hold zemplar . Last outpt iPTH was 43 in November down from 368 in Oct and 689 in Sept.  I think he needs significant reduction of zemplar at discharge (current dose 18 -on this since 10/23) and change to 2 K 2.25 Ca bath.  Reassess Ca and zemplar dosing at next HD if still here; d/c between meal phoslo - not taking any solids. 5. HTN/volume - 2 kg below EDW - keeping even today - ^ BP likely related to pain;  BP well controlled < 130 systolic as outpt. 6. Nutrition - CL - advance as tolerated 7. Type 2 DM - BS stable  Sheffield Slider, PA-C [facility] Beeper [phone] 02/18/2012,9:19 AM  LOS: 2 days    Additional Objective Labs: Basic Metabolic Panel:  Lab 02/18/12 4540 02/17/12 0610 02/16/12 2121 02/15/12 2214  NA 138 140 -- 141  K 3.8 3.8 -- 3.5  CL 95* 97 -- 97  CO2 25 27 -- 26  GLUCOSE 121* 96 -- 152*  BUN 44* 29* -- 17   CREATININE 14.84* 11.76* 10.30* --  CALCIUM 10.7* 10.7* -- 10.9*  ALB -- -- -- --  PHOS 6.9* -- -- --   Liver Function Tests:  Lab 02/18/12 0638 02/15/12 2214  AST -- 22  ALT -- 18  ALKPHOS -- 70  BILITOT -- 0.5  PROT -- 8.4*  ALBUMIN 3.7 4.2    Lab 02/16/12 0341  LIPASE 14  AMYLASE --   CBC:  Lab 02/18/12 0638 02/17/12 0610 02/16/12 2121 02/15/12 2214  WBC 11.8* 11.0* 15.4* --  NEUTROABS -- -- -- --  HGB 12.1* 11.0* 13.3 --  HCT 35.7* 34.6* 41.5 --  MCV 93.0 97.2 96.7 94.7  PLT 178 183 193 --  CBG:  Lab 02/17/12 2150 02/17/12 1622 02/17/12 1104 02/17/12 0839 02/16/12 0718  GLUCAP 91 85 79 154* 129*  Studies/Results: Ct Abdomen Pelvis W Contrast  02/16/2012  *RADIOLOGY REPORT*  Clinical Data: Central chest pain and left side abdominal pain. Nausea and vomiting.  Patient on dialysis.  CT ABDOMEN AND PELVIS WITH CONTRAST  Technique:  Multidetector CT imaging of the abdomen and pelvis was performed following the standard protocol during bolus administration of intravenous contrast.  Contrast: 80mL OMNIPAQUE IOHEXOL 300 MG/ML  SOLN  Comparison: CT abdomen and pelvis 01/15/2012.  Findings: Mild basilar atelectasis is noted.  No pleural or pericardial effusion.  A lap band is again identified and unchanged.  The patient is status post cholecystectomy.  Atrophic kidneys are noted.  The liver, spleen, right adrenal gland and pancreas appear normal. 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  A very few colonic diverticula are noted without diverticulitis.  Stomach, small bowel and appendix appear normal.  No lymphadenopathy or fluid. Small fat containing umbilical hernia noted.  There is no focal bony abnormality.  Small exostosis versus small enthesophyte off the posterior aspect the right left acetabulum is incidentally noted and unchanged.  IMPRESSION:  1.  No acute finding. 2.  Status post lap band placement and cholecystectomy. 3.  Chronic renal atrophy. 4.  Very small fat  containing umbilical hernia.   Original Report Authenticated By: Holley Dexter, M.D.    Medications:      . calcium acetate  1,334 mg Oral BID BM  . calcium acetate  2,001 mg Oral TID WC  . cinacalcet  60 mg Oral QHS  . enoxaparin (LOVENOX) injection  40 mg Subcutaneous Q24H  . ezetimibe  10 mg Oral QHS  . gabapentin  100 mg Oral QHS  . [COMPLETED]  HYDROmorphone (DILAUDID) injection  2 mg Intravenous Once  . insulin aspart  0-15 Units Subcutaneous TID WC  . insulin aspart  4 Units Subcutaneous TID WC  . lanthanum  2,000 mg Oral TID WC  . [COMPLETED] morphine      . pantoprazole  40 mg Oral QHS  . sodium chloride  3 mL Intravenous Q12H  . [DISCONTINUED] pantoprazole (PROTONIX) IV  40 mg Intravenous Q24H

## 2012-02-18 NOTE — Procedures (Signed)
Patient seen on Hemodialysis. QB 400, UF goal 0.5L Treatment adjusted as needed.  Zetta Bills MD [facility]. Office # [phone] Pager # [phone] 10:08 AM

## 2012-02-18 NOTE — Progress Notes (Signed)
[facility]             Progress Note   Subjective: Has had return of n/v. Wants lap-band out.  Objective: Vital signs in last 24 hours: Temp:  [97.7 F (36.5 C)-98.3 F (36.8 C)] 98.2 F (36.8 C) (12/09 0644) Pulse Rate:  [81-98] 86  (12/09 1102) Resp:  [18] 18  (12/09 0644) BP: (126-186)/(72-101) 159/90 mmHg (12/09 1102) SpO2:  [97 %-100 %] 97 % (12/09 0644) Weight:  [118.6 kg (261 lb 7.5 oz)-119.9 kg (264 lb 5.3 oz)] 118.6 kg (261 lb 7.5 oz) (12/09 0644) Weight change: 1.2 kg (2 lb 10.3 oz) Last BM Date: 02/16/12  Intake/Output from previous day: 12/08 0701 - 12/09 0700 In: 840 [P.O.:840] Out: 450 [Urine:450]     Physical Exam: General: Alert, awake, oriented x3. HEENT: No bruits, no goiter. Heart: Regular rate and rhythm, without murmurs, rubs, gallops. Lungs: Clear to auscultation bilaterally. Abdomen: Soft, nontender, nondistended, positive bowel sounds. Extremities: No clubbing cyanosis or edema with positive pedal pulses. Neuro: Grossly intact, nonfocal.    Lab Results: Basic Metabolic Panel:  Basename 02/18/12 0638 02/17/12 0610  NA 138 140  K 3.8 3.8  CL 95* 97  CO2 25 27  GLUCOSE 121* 96  BUN 44* 29*  CREATININE 14.84* 11.76*  CALCIUM 10.7* 10.7*  MG -- --  PHOS 6.9* --   Liver Function Tests:  [facility] 02/18/12 1610 02/15/12 2214  AST -- 22  ALT -- 18  ALKPHOS -- 70  BILITOT -- 0.5  PROT -- 8.4*  ALBUMIN 3.7 4.2    Basename 02/16/12 0341  LIPASE 14  AMYLASE --   CBC:  Basename 02/18/12 0638 02/17/12 0610  WBC 11.8* 11.0*  NEUTROABS -- --  HGB 12.1* 11.0*  HCT 35.7* 34.6*  MCV 93.0 97.2  PLT 178 183   CBG:  Basename 02/17/12 2150 02/17/12 1622 02/17/12 1104 02/17/12 0839 02/16/12 0718  GLUCAP 91 85 79 154* 129*    Studies/Results: No results found.  Medications: Scheduled Meds:    . calcium acetate  2,001 mg Oral TID WC  . cinacalcet  60 mg Oral QHS  . enoxaparin (LOVENOX) injection  40 mg  Subcutaneous Q24H  . ezetimibe  10 mg Oral QHS  . gabapentin  100 mg Oral QHS  . [COMPLETED]  HYDROmorphone (DILAUDID) injection  2 mg Intravenous Once  . insulin aspart  0-15 Units Subcutaneous TID WC  . insulin aspart  4 Units Subcutaneous TID WC  . lanthanum  2,000 mg Oral TID WC  . [COMPLETED] morphine      . pantoprazole  40 mg Oral QHS  . sodium chloride  3 mL Intravenous Q12H  . 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 -Patient wants the lap-band to be removed. -Will request surgical input. -Continue symptomatic treatment for now.  ESRD -MWF. Seen in HD today. -Renal on board.  DM-II -Well controlled. -Continue current regimen.  HTN -Well controlled.  Disposition -Will request surgical input as to lap-band.   Time spent coordinating care: 35 minutes.   LOS: 2 days   [facility] Pager: [phone] 02/18/2012, 11:15 AM

## 2012-02-18 NOTE — Consult Note (Signed)
Reason for Consult: Intractable nausea and vomiting Referring Physician: Aries Townley is an 46 y.o. male.  HPI: Pleasant 46 y/o man with h/o ESRD on HD MWF presents with intractable nausea and vomiting that began 2 days ago. He is on the kidney transplant list and in order to maintain his status on it he has been asked to lose weight. Because of this he had a lap band procedure done 2 years agoand has been having intermittently severe n/v ever since. He has been advised by Dr. Daphine Deutscher to have it reversed but his transplant specialist is reluctant to do so. He last had one of these severe episodes last month and required admission for symptomatic relief. He has been given a trial of POs in the ED and has failed. He has been admitted to hospital medicine service who is now asking [place] to see the patient on consult for ongoing c/o of abdominal pain N/V. We will order a barium swallow study per Dr. Daphine Deutscher to look for pre or post lap band stricture obstruction causing c/o. Last intervention per patient was a "fluid fill" 1 year ago.     Past Medical History  Diagnosis Date  . ESRD on hemodialysis     [place] [facility] on MWF, ESRD due to DM/HTN  . Recurrent vomiting   . Hyperlipidemia   . Thyroid disorder     takes sensipar  . Diabetes mellitus     controlling by weight/diet  . 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 GERD (gastroesophageal reflux disease)   . Hypertension   . Hyperthyroidism     Past Surgical History  Procedure Date  . Gastric restriction surgery 12/06/09    lap band  . Dialysis fistula creation 2011  . Insertion of dialysis catheter 11/13/2011    Procedure: INSERTION OF DIALYSIS CATHETER;  Surgeon: Sherren Kerns, MD;  Location[facility] OR;  Service: Vascular;  Laterality: Right;  Insertion of 23cm dialysis catheter in right IJ  . Av fistula placement 12/05/2011    Procedure: ARTERIOVENOUS (AV) FISTULA CREATION;  Surgeon: Larina Earthly, MD;  Location: [facility] OR;   Service: Vascular;  Laterality: Left;    Family History  Problem Relation Age of Onset  . Hypertension Mother   . Diabetes Mother   . Hypertension Father   . Diabetes Father     Social History:  reports that he has never smoked. He has never used smokeless tobacco. He reports that he does not drink alcohol or use illicit drugs.  Allergies: No Known Allergies  Medications: I have reviewed the patient's current medications.  Results for orders placed during the hospital encounter of 02/16/12 (from the past 48 hour(s))  CBC     Status: Abnormal   Collection Time   02/16/12  9:21 PM      Component Value Range Comment   WBC 15.4 (*) 4.0 - 10.5 K/uL    RBC 4.29  4.22 - 5.81 MIL/uL    Hemoglobin 13.3  13.0 - 17.0 g/dL    HCT 16.1  09.6 - 04.5 %    MCV 96.7  78.0 - 100.0 fL    MCH 31.0  26.0 - 34.0 pg    MCHC 32.0  30.0 - 36.0 g/dL    RDW 40.9  81.1 - 91.4 %    Platelets 193  150 - 400 K/uL   CREATININE, SERUM     Status: Abnormal   Collection Time   02/16/12  9:21  PM      Component Value Range Comment   Creatinine, Ser 10.30 (*) 0.50 - 1.35 mg/dL    GFR calc non Af Amer 5 (*) >90 mL/min    GFR calc Af Amer 6 (*) >90 mL/min   HEMOGLOBIN A1C     Status: Normal   Collection Time   02/16/12  9:21 PM      Component Value Range Comment   Hemoglobin A1C 5.3  <5.7 %    Mean Plasma Glucose 105  <117 mg/dL   BASIC METABOLIC PANEL     Status: Abnormal   Collection Time   02/17/12  6:10 AM      Component Value Range Comment   Sodium 140  135 - 145 mEq/L    Potassium 3.8  3.5 - 5.1 mEq/L    Chloride 97  96 - 112 mEq/L    CO2 27  19 - 32 mEq/L    Glucose, Bld 96  70 - 99 mg/dL    BUN 29 (*) 6 - 23 mg/dL    Creatinine, Ser 16.10 (*) 0.50 - 1.35 mg/dL    Calcium 96.0 (*) 8.4 - 10.5 mg/dL    GFR calc non Af Amer 4 (*) >90 mL/min    GFR calc Af Amer 5 (*) >90 mL/min   CBC     Status: Abnormal   Collection Time   02/17/12  6:10 AM      Component Value Range Comment   WBC 11.0 (*) 4.0 -  10.5 K/uL    RBC 3.56 (*) 4.22 - 5.81 MIL/uL    Hemoglobin 11.0 (*) 13.0 - 17.0 g/dL    HCT 45.4 (*) 09.8 - 52.0 %    MCV 97.2  78.0 - 100.0 fL    MCH 30.9  26.0 - 34.0 pg    MCHC 31.8  30.0 - 36.0 g/dL    RDW 11.9  14.7 - 82.9 %    Platelets 183  150 - 400 K/uL   GLUCOSE, CAPILLARY     Status: Abnormal   Collection Time   02/17/12  8:39 AM      Component Value Range Comment   Glucose-Capillary 154 (*) 70 - 99 mg/dL   GLUCOSE, CAPILLARY     Status: Normal   Collection Time   02/17/12 11:04 AM      Component Value Range Comment   Glucose-Capillary 79  70 - 99 mg/dL    Comment 1 Notify RN     GLUCOSE, CAPILLARY     Status: Normal   Collection Time   02/17/12  4:22 PM      Component Value Range Comment   Glucose-Capillary 85  70 - 99 mg/dL   GLUCOSE, CAPILLARY     Status: Normal   Collection Time   02/17/12  9:50 PM      Component Value Range Comment   Glucose-Capillary 91  70 - 99 mg/dL   CBC     Status: Abnormal   Collection Time   02/18/12  6:38 AM      Component Value Range Comment   WBC 11.8 (*) 4.0 - 10.5 K/uL    RBC 3.84 (*) 4.22 - 5.81 MIL/uL    Hemoglobin 12.1 (*) 13.0 - 17.0 g/dL    HCT 56.2 (*) 13.0 - 52.0 %    MCV 93.0  78.0 - 100.0 fL    MCH 31.5  26.0 - 34.0 pg    MCHC 33.9  30.0 - 36.0 g/dL  RDW 13.9  11.5 - 15.5 %    Platelets 178  150 - 400 K/uL   RENAL FUNCTION PANEL     Status: Abnormal   Collection Time   02/18/12  6:38 AM      Component Value Range Comment   Sodium 138  135 - 145 mEq/L    Potassium 3.8  3.5 - 5.1 mEq/L    Chloride 95 (*) 96 - 112 mEq/L    CO2 25  19 - 32 mEq/L    Glucose, Bld 121 (*) 70 - 99 mg/dL    BUN 44 (*) 6 - 23 mg/dL    Creatinine, Ser 16.10 (*) 0.50 - 1.35 mg/dL    Calcium 96.0 (*) 8.4 - 10.5 mg/dL    Phosphorus 6.9 (*) 2.3 - 4.6 mg/dL    Albumin 3.7  3.5 - 5.2 g/dL    GFR calc non Af Amer 3 (*) >90 mL/min    GFR calc Af Amer 4 (*) >90 mL/min     No results found.  Review of Systems  Constitutional: Negative.   Negative for fever, chills, weight loss, malaise/fatigue and diaphoresis.  HENT: Negative.   Eyes: Negative.   Respiratory: Negative.   Cardiovascular: Negative.   Gastrointestinal: Positive for nausea, vomiting and abdominal pain. Negative for heartburn, diarrhea, constipation, blood in stool and melena.  Genitourinary: Negative.   Musculoskeletal: Negative.   Skin: Negative.   Neurological: Negative.  Negative for weakness.  Endo/Heme/Allergies: Negative.   Psychiatric/Behavioral: Negative.    Blood pressure 169/90, pulse 84, temperature 98.7 F (37.1 C), temperature source Oral, resp. rate 18, height 5\' 11"  (1.803 m), weight 257 lb 15 oz (117 kg), SpO2 99.00%. Physical Exam  Constitutional: He is oriented to person, place, and time. He appears well-developed and well-nourished. No distress.  HENT:  Head: Normocephalic and atraumatic.  Mouth/Throat: No oropharyngeal exudate.  Eyes: Conjunctivae normal and EOM are normal. Pupils are equal, round, and reactive to light. Right eye exhibits no discharge. Left eye exhibits no discharge. No scleral icterus.  Neck: Normal range of motion. Neck supple. No JVD present. No tracheal deviation present. No thyromegaly present.  Cardiovascular: Normal rate, regular rhythm, normal heart sounds and intact distal pulses.  Exam reveals no gallop and no friction rub.   No murmur heard. Respiratory: Effort normal and breath sounds normal. No stridor. No respiratory distress. He has no wheezes. He has no rales. He exhibits no tenderness.  Musculoskeletal: He exhibits no edema and no tenderness.  Lymphadenopathy:    He has no cervical adenopathy.  Neurological: He is alert and oriented to person, place, and time.  Skin: Skin is warm and dry. He is not diaphoretic.  Psychiatric: He has a normal mood and affect.    Assessment/Plan: Patient Active Problem List  Diagnosis  . ESRD (end stage renal disease) on dialysis  . 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 End stage renal disease  . Vomiting  . Abdominal pain  . Hypertension  . Intractable nausea and vomiting   Plan:  1. Patient will be kept NPO 2. Obtain Barium swallow study to look for pre or post lap band stricture/obstruction per Dr. Daphine Deutscher 3. May need to remove fluid from lap band in an effort to facilitate patient comfort. Last intervention with lap band was a "fluid fill" 1 year ago by per patient. 4. Medicine to manage his other medical issues.  Blenda Mounts [facility] Pager [phone] 02/18/2012, 11:48 AM

## 2012-02-18 NOTE — Consult Note (Signed)
Needs UGI to evaluate band. Keep NPO for now.  Seen and questions answered

## 2012-02-18 NOTE — Progress Notes (Signed)
I have personally seen and examined this patient and agree with the assessment/plan as outlined above by [facility] PA. Frustrated with problems surrounding LAP-band and wants it taken out. Await surgical evaluation. Renda Pohlman K.,MD 02/18/2012 10:07 AM

## 2012-02-19 LAB — RENAL FUNCTION PANEL
Albumin: 3.4 g/dL — ABNORMAL LOW (ref 3.5–5.2)
BUN: 25 mg/dL — ABNORMAL HIGH (ref 6–23)
CO2: 27 mEq/L (ref 19–32)
Calcium: 9.4 mg/dL (ref 8.4–10.5)
Chloride: 98 mEq/L (ref 96–112)
Creatinine, Ser: 10.66 mg/dL — ABNORMAL HIGH (ref 0.50–1.35)
GFR calc Af Amer: 6 mL/min — ABNORMAL LOW (ref >90)
GFR calc non Af Amer: 5 mL/min — ABNORMAL LOW (ref >90)
Glucose, Bld: 88 mg/dL (ref 70–99)
Phosphorus: 5.4 mg/dL — ABNORMAL HIGH (ref 2.3–4.6)
Potassium: 3.8 mEq/L (ref 3.5–5.1)
Sodium: 138 mEq/L (ref 135–145)

## 2012-02-19 LAB — CBC
HCT: 33.9 % — ABNORMAL LOW (ref 39.0–52.0)
Hemoglobin: 11 g/dL — ABNORMAL LOW (ref 13.0–17.0)
MCH: 31.1 pg (ref 26.0–34.0)
MCHC: 32.4 g/dL (ref 30.0–36.0)
MCV: 95.8 fL (ref 78.0–100.0)
Platelets: 148 10*3/uL — ABNORMAL LOW (ref 150–400)
RBC: 3.54 MIL/uL — ABNORMAL LOW (ref 4.22–5.81)
RDW: 14 % (ref 11.5–15.5)
WBC: 8.2 10*3/uL (ref 4.0–10.5)

## 2012-02-19 LAB — GLUCOSE, CAPILLARY: Glucose-Capillary: 95 mg/dL (ref 70–99)

## 2012-02-19 MED ORDER — METOCLOPRAMIDE HCL 10 MG PO TABS
10.0000 mg | ORAL_TABLET | Freq: Three times a day (TID) | ORAL | Status: DC
  Administered 2012-02-19 – 2012-02-20 (×4): 10 mg via ORAL
  Filled 2012-02-19 (×8): qty 1

## 2012-02-19 NOTE — Progress Notes (Signed)
Reviewed films. Passed this on to bariatrics.  Can go home from  my standpoint.   Can follow up with band clinic.

## 2012-02-19 NOTE — Progress Notes (Signed)
Patient ID: Randall [name], male   DOB: 1965-07-08, 46 y.o.   MRN: [id number]    [facility] Progress Note    Subjective:   Reports that he feels fair and had barium swallow done yesterday. Reports limited PO intake yesterday and apprehensive about N/V   Objective:   BP 111/73  Pulse 77  Temp 98.4 F (36.9 C) (Oral)  Resp 18  Ht 5\' 11"  (1.803 m)  Wt 117 kg (257 lb 15 oz)  BMI 35.98 kg/m2  SpO2 92%  Physical Exam: WUJ:[id number] resting in bed AOZ:HYQMV RRR, normal S1 and S2  Resp:CTA bilaterally, no rales/rhonchi HQI:ONGE, obese, mildly tender over epigastric area Ext:No LE edema  Labs: BMET  Lab 02/19/12 0455 02/18/12 0638 02/17/12 0610 02/16/12 2121 02/15/12 2214  NA 138 138 140 -- 141  K 3.8 3.8 3.8 -- 3.5  CL 98 95* 97 -- 97  CO2 27 25 27  -- 26  GLUCOSE 88 121* 96 -- 152*  BUN 25* 44* 29* -- 17  CREATININE 10.66* 14.84* 11.76* 10.30* 7.28*  ALB -- -- -- -- --  CALCIUM 9.4 10.7* 10.7* -- 10.9*  PHOS 5.4* 6.9* -- -- --   CBC  Lab 02/19/12 0455 02/18/12 0638 02/17/12 0610 02/16/12 2121  WBC 8.2 11.8* 11.0* 15.4*  NEUTROABS -- -- -- --  HGB 11.0* 12.1* 11.0* 13.3  HCT 33.9* 35.7* 34.6* 41.5  MCV 95.8 93.0 97.2 96.7  PLT 148* 178 183 193    Medications:      . calcium acetate  2,001 mg Oral TID WC  . cinacalcet  60 mg Oral QHS  . enoxaparin (LOVENOX) injection  30 mg Subcutaneous Q24H  . ezetimibe  10 mg Oral QHS  . gabapentin  100 mg Oral QHS  . insulin aspart  0-15 Units Subcutaneous TID WC  . insulin aspart  4 Units Subcutaneous TID WC  . lanthanum  2,000 mg Oral TID WC  . pantoprazole  40 mg Oral QHS  . sodium chloride  3 mL Intravenous Q12H  . [DISCONTINUED] calcium acetate  1,334 mg Oral BID BM  . [DISCONTINUED] enoxaparin (LOVENOX) injection  40 mg Subcutaneous Q24H     Assessment/ Plan:   1. Intractable N and V - intermittent, thought to have been related to lap band- given the barium swallow report, it appears that the delay  in gastric emptying is at the gastric outlet rather than at the level of the band. Unclear if deflating the band will help. I will start metoclopramide to allow gastric emptying. 2. ESRD - MWF per routine - on 4 K bath due to K 3.8  3. Anemia - Hgb 12.1 - holding ESA  4. Secondary hyperparathyroidism - hypercalcemic at 10.7 on 2.5 Ca bath as outpt; Ca also in 10s as an oupt. On 4K bath with 2.25 Ca today. Will hold zemplar . Last outpt iPTH was 43 in November down from 368 in Oct and 689 in Sept. I think he needs significant reduction of zemplar at discharge (current dose 18 -on this since 10/23) and change to 2 K 2.25 Ca bath. Reassess Ca and zemplar dosing at next HD if still here; d/c between meal phoslo - not taking any solids.  5. HTN/volume - 2 kg below EDW - keeping even today - ^ BP likely related to pain; BP well controlled < 130 systolic as outpt.  6. Nutrition - CL - advance as tolerated  7. Type 2 DM - BS stable  Zetta Bills, MD 02/19/2012, 9:31 AM

## 2012-02-19 NOTE — Progress Notes (Signed)
Subjective: States feeling hungry but worried about nausea/vomiting. States tolerates liquids ok and would like to try them. Patient describes being on Reglan in the past with some results for diabetic gastroparesis.   Objective: Vital signs in last 24 hours: Temp:  [97.8 F (36.6 C)-98.7 F (37.1 C)] 98.4 F (36.9 C) (12/10 0628) Pulse Rate:  [77-98] 77  (12/10 0628) Resp:  [18] 18  (12/10 0628) BP: (111-174)/(73-100) 111/73 mmHg (12/10 0628) SpO2:  [92 %-99 %] 92 % (12/10 0628) Weight:  [257 lb 15 oz (117 kg)] 257 lb 15 oz (117 kg) (12/09 1106) Last BM Date: 02/16/12  Intake/Output from previous day: 12/09 0701 - 12/10 0700 In: 0  Out: 14  Intake/Output this shift:    PE: Gen: alert, cooperative, NAD CV: RRR no mrg Resp: normal effort, CTAB Abd: soft, nontender, nondistended, lap band port felt subcutaneously   Lab Results:   Basename 02/19/12 0455 02/18/12 0638  WBC 8.2 11.8*  HGB 11.0* 12.1*  HCT 33.9* 35.7*  PLT 148* 178   BMET  Basename 02/19/12 0455 02/18/12 0638  NA 138 138  K 3.8 3.8  CL 98 95*  CO2 27 25  GLUCOSE 88 121*  BUN 25* 44*  CREATININE 10.66* 14.84*  CALCIUM 9.4 10.7*   PT/INR No results found for this basename: LABPROT:2,INR:2 in the last 72 hours CMP     Component Value Date/Time   NA 138 02/19/2012 0455   K 3.8 02/19/2012 0455   CL 98 02/19/2012 0455   CO2 27 02/19/2012 0455   GLUCOSE 88 02/19/2012 0455   BUN 25* 02/19/2012 0455   CREATININE 10.66* 02/19/2012 0455   CALCIUM 9.4 02/19/2012 0455   CALCIUM 7.4* 05/26/2010 0921   PROT 8.4* 02/15/2012 2214   ALBUMIN 3.4* 02/19/2012 0455   AST 22 02/15/2012 2214   ALT 18 02/15/2012 2214   ALKPHOS 70 02/15/2012 2214   BILITOT 0.5 02/15/2012 2214   GFRNONAA 5* 02/19/2012 0455   GFRAA 6* 02/19/2012 0455   Lipase     Component Value Date/Time   LIPASE 14 02/16/2012 0341       Studies/Results: Dg Esophagus  02/18/2012  *RADIOLOGY REPORT*  Clinical Data: 46 year old male with  history of prior gastric bypass procedure and Lap-Band placement.  Nausea and vomiting.  ESOPHOGRAM/BARIUM SWALLOW  Technique:  Single contrast examination was performed using thin barium.  Fluoroscopy time:  1.36 minutes.  Comparison:  No priors.  Findings:  Lap-Band was orientated correctly from the 2 o'clock to 8 o'clock position.  During multiple swallow attempts barium passed immediately through the Lap-Band into what appeared to be a small gastric remnant lying beneath the Lap-Band.  This barium then immediately extended into the small bowel.   Multiple swallows were repeated yielding the same results.  IMPRESSION: 1.  Although the Lap-Band appears to be correctly oriented, the gastric remnant appears to lie beneath the Lap-Band. No signs to suggest obstruction from either the Lap-Band or at the level of the gastric bypass.   Original Report Authenticated By: Trudie Reed, M.D.     Anti-infectives: Anti-infectives    None       Assessment/Plan  Principal problem: Intractable nausea/vomiting with history of lap band Patient Active Problem List  Diagnosis  . ESRD (end stage renal disease) on dialysis  . 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 End stage renal disease  . Vomiting  . Abdominal pain  . Hypertension  . Intractable nausea and vomiting    Plan:  1. Patient with no obstruction pre or post lap band on barium swallow.   2. Agree with Dr. Allena Katz that element of gastroparesis may be contributing. Will trial Reglan TID with meals +QHS. Patient could likely try a clear liquid diet along with this regimen.  3. Will get patient set up for band clinic (1st available 12/19) to have fluid removed. If patient remains in hospital, could consider removal of fluid on Wednesday or Thursday. Alternatively, patient could go to Dr. Ermalene Searing clinic.   -Medicine team managing chronic medical problems.   LOS: 3 days    Tana Conch 02/19/2012, 8:48 AM Pager: [phone]

## 2012-02-19 NOTE — Progress Notes (Signed)
[facility]             Progress Note   Subjective: Has been kept NPO overnight. In good spirits.  Objective: Vital signs in last 24 hours: Temp:  [97.8 F (36.6 C)-98.7 F (37.1 C)] 98.4 F (36.9 C) (12/10 0628) Pulse Rate:  [77-98] 77  (12/10 0628) Resp:  [18] 18  (12/10 0628) BP: (111-174)/(73-100) 111/73 mmHg (12/10 0628) SpO2:  [92 %-99 %] 92 % (12/10 0628) Weight:  [117 kg (257 lb 15 oz)] 117 kg (257 lb 15 oz) (12/09 1106) Weight change: -2.9 kg (-6 lb 6.3 oz) Last BM Date: 02/16/12  Intake/Output from previous day: 12/09 0701 - 12/10 0700 In: 0  Out: 14      Physical Exam: General: Alert, awake, oriented x3. HEENT: No bruits, no goiter. Heart: Regular rate and rhythm, without murmurs, rubs, gallops. Lungs: Clear to auscultation bilaterally. Abdomen: Soft, nontender, nondistended, positive bowel sounds. Extremities: No clubbing cyanosis or edema with positive pedal pulses. Neuro: Grossly intact, nonfocal.    Lab Results: Basic Metabolic Panel:  Basename 02/19/12 0455 02/18/12 0638  NA 138 138  K 3.8 3.8  CL 98 95*  CO2 27 25  GLUCOSE 88 121*  BUN 25* 44*  CREATININE 10.66* 14.84*  CALCIUM 9.4 10.7*  MG -- --  PHOS 5.4* 6.9*   Liver Function Tests:  Basename 02/19/12 0455 02/18/12 0638  AST -- --  ALT -- --  ALKPHOS -- --  BILITOT -- --  PROT -- --  ALBUMIN 3.4* 3.7   CBC:  Basename 02/19/12 0455 02/18/12 0638  WBC 8.2 11.8*  NEUTROABS -- --  HGB 11.0* 12.1*  HCT 33.9* 35.7*  MCV 95.8 93.0  PLT 148* 178   CBG:  Basename 02/19/12 0738 02/18/12 1647 02/18/12 1144 02/17/12 2150 02/17/12 1622 02/17/12 1104  GLUCAP 95 92 107* 91 85 79    Studies/Results: Dg Esophagus  02/18/2012  *RADIOLOGY REPORT*  Clinical Data: 46 year old male with history of prior gastric bypass procedure and Lap-Band placement.  Nausea and vomiting.  ESOPHOGRAM/BARIUM SWALLOW  Technique:  Single contrast examination was performed using thin barium.   Fluoroscopy time:  1.36 minutes.  Comparison:  No priors.  Findings:  Lap-Band was orientated correctly from the 2 o'clock to 8 o'clock position.  During multiple swallow attempts barium passed immediately through the Lap-Band into what appeared to be a small gastric remnant lying beneath the Lap-Band.  This barium then immediately extended into the small bowel.   Multiple swallows were repeated yielding the same results.  IMPRESSION: 1.  Although the Lap-Band appears to be correctly oriented, the gastric remnant appears to lie beneath the Lap-Band. No signs to suggest obstruction from either the Lap-Band or at the level of the gastric bypass.   Original Report Authenticated By: Trudie Reed, M.D.     Medications: Scheduled Meds:    . calcium acetate  2,001 mg Oral TID WC  . cinacalcet  60 mg Oral QHS  . enoxaparin (LOVENOX) injection  30 mg Subcutaneous Q24H  . ezetimibe  10 mg Oral QHS  . gabapentin  100 mg Oral QHS  . insulin aspart  0-15 Units Subcutaneous TID WC  . insulin aspart  4 Units Subcutaneous TID WC  . lanthanum  2,000 mg Oral TID WC  . pantoprazole  40 mg Oral QHS  . sodium chloride  3 mL Intravenous Q12H  . [DISCONTINUED] calcium acetate  1,334 mg Oral BID BM  . 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 -Continue symptomatic treatment for now. -Plan per surgery in regards to his band? -Patient is wondering whether he can have fluid removed from the lap band as this seemed to help last time.  ESRD -MWF. Seen in HD today. -Renal on  board.  DM-II -Well controlled. -Continue current regimen.  HTN -Well controlled.  Disposition -Await surgical recommendations.   Time spent coordinating care: 25 minutes.   LOS: 3 days   HERNANDEZ ACOSTA,ESTELA [facility] Pager: [phone] 02/19/2012, 9:08 AM

## 2012-02-19 NOTE — ED Provider Notes (Signed)
examination/treatment/procedure(s) were performed by non-physician practitioner while in observation unit and as supervising physician I was immediately available for consultation/collaboration.   Gavin Pound. Oletta Lamas, MD 02/19/12 [phone]

## 2012-02-20 DIAGNOSIS — R1110 Vomiting, unspecified: Secondary | ICD-10-CM

## 2012-02-20 LAB — RENAL FUNCTION PANEL
Albumin: 3.4 g/dL — ABNORMAL LOW (ref 3.5–5.2)
CO2: 27 mEq/L (ref 19–32)
Calcium: 8.8 mg/dL (ref 8.4–10.5)
GFR calc Af Amer: 4 mL/min — ABNORMAL LOW (ref >90)
GFR calc non Af Amer: 4 mL/min — ABNORMAL LOW (ref >90)
Phosphorus: 6.1 mg/dL — ABNORMAL HIGH (ref 2.3–4.6)
Sodium: 138 mEq/L (ref 135–145)

## 2012-02-20 LAB — CBC
MCH: 30.9 pg (ref 26.0–34.0)
Platelets: 142 10*3/uL — ABNORMAL LOW (ref 150–400)
RBC: 3.5 MIL/uL — ABNORMAL LOW (ref 4.22–5.81)
RDW: 13.7 % (ref 11.5–15.5)

## 2012-02-20 MED ORDER — ALTEPLASE 2 MG IJ SOLR: 2.0000 mg | Freq: Once | INTRAMUSCULAR | Status: DC | PRN

## 2012-02-20 MED ORDER — SODIUM CHLORIDE 0.9 % IV SOLN: 100.0000 mL | INTRAVENOUS | Status: DC | PRN

## 2012-02-20 MED ORDER — METOCLOPRAMIDE HCL 10 MG PO TABS: 10.0000 mg | ORAL_TABLET | Freq: Three times a day (TID) | ORAL | Status: DC

## 2012-02-20 MED ORDER — PENTAFLUOROPROP-TETRAFLUOROETH EX AERO: 1.0000 "application " | INHALATION_SPRAY | CUTANEOUS | Status: DC | PRN

## 2012-02-20 MED ORDER — HEPARIN SODIUM (PORCINE) 1000 UNIT/ML DIALYSIS: 3000.0000 [IU] | INTRAMUSCULAR | Status: DC | PRN

## 2012-02-20 MED ORDER — LIDOCAINE-PRILOCAINE 2.5-2.5 % EX CREA: 1.0000 "application " | TOPICAL_CREAM | CUTANEOUS | Status: DC | PRN

## 2012-02-20 MED ORDER — NEPRO/CARBSTEADY PO LIQD: 237.0000 mL | ORAL | Status: DC | PRN

## 2012-02-20 MED ORDER — HEPARIN SODIUM (PORCINE) 1000 UNIT/ML DIALYSIS
1000.0000 [IU] | INTRAMUSCULAR | Status: DC | PRN
  Administered 2012-02-20: 1000 [IU] via INTRAVENOUS_CENTRAL

## 2012-02-20 MED ORDER — LIDOCAINE HCL (PF) 1 % IJ SOLN: 5.0000 mL | INTRAMUSCULAR | Status: DC | PRN

## 2012-02-20 NOTE — Procedures (Signed)
Patient seen on Hemodialysis. QB 400, UF goal 0.6L Treatment adjusted as needed.  Zetta Bills MD [facility]. Office # [phone] Pager # [phone] 9:41 AM

## 2012-02-20 NOTE — Progress Notes (Signed)
Patient was discharged home with family member. Patient was given discharge instructions and prescriptions. Patient was told to call PCP with any concerns and to follow up with appointments. Patient was stable upon discharge.

## 2012-02-20 NOTE — Discharge Summary (Signed)
Physician Discharge Summary  Randall [name] AVW:[id number] DOB: [birth year] DOA: 02/16/2012  PCP: [name]., MD  Admit date: 02/16/2012 Discharge date: 02/20/2012  Time spent: 35 minutes  Recommendations for Outpatient Follow-up:  1. Please be sure to follow up with hemoglobin 2. Continue regular scheduled dialysis sessions  3. Please follow up with blood sugars and treat pending results  Discharge Diagnoses:  Principal Problem:  *Intractable nausea and vomiting Active Problems:  ESRD (end stage renal disease) on dialysis  Lapband APL over bypass Sept 2011  Hypertension   Discharge Condition: stable   Diet recommendation: Renal diet  Filed Weights   02/19/12 2105 02/20/12 0700 02/20/12 1130  Weight: 120.294 kg (265 lb 3.2 oz) 118.5 kg (261 lb 3.9 oz) 117 kg (257 lb 15 oz)    History of present illness:  From original HPI: Pleasant 46 y/o man with h/o ESRD on HD MWF presents with intractable nausea and vomiting that began yesterday morning. He is on the kidney transplant list and in order to maintain his status on it he has been asked to lose weight. Because of this he had a lap band procedure done 2 years ago and has been having intermittently severe n/v ever since. He has been advised by Dr. Daphine Deutscher to have it reversed but his transplant specialist is reluctant to do so. He last had one of these severe episodes last month and required admission for symptomatic relief. He has been given a trial of POs in the ED and has failed so we have been asked to admit him for further evaluation and management.   Hospital Course:  Intractable N/V  -Resolved with metoclopramide on board. -Pt has zofran at home but denies any nausea currently.  -Plan is to f/u with Dr. Daphine Deutscher this friday  -Patient is wondering whether he can have fluid removed from the lap band as this seemed to help last time.   ESRD  -MWF. Seen in HD today.  -Renal on board while patient in house.  DM-II   -Will recommend diabetic diet - Blood sugars should improve with weight loss. - Pt to f/u with PCP   HTN  -Well controlled.   Disposition  -f/u with Dr. Daphine Deutscher as outlined above    Procedures:  HD while in house  Consultations:  Nephrology: Dr. Allena Katz   General surgery: Harriette Bouillon  Discharge Exam: Filed Vitals:   02/20/12 1000 02/20/12 1032 02/20/12 1100 02/20/12 1130  BP: 133/80 125/74 127/74 127/70  Pulse: 84 80 78 73  Temp:    98.3 F (36.8 C)  TempSrc:    Oral  Resp:      Height:      Weight:    117 kg (257 lb 15 oz)  SpO2:    98%    General: Pt in NAD, Alert and Oriented in NAD. smiling Cardiovascular: RRR, No MRG Respiratory: CTA BL, no wheezes Abdomen: NT, obese, + BS  Discharge Instructions  Discharge Orders    Future Appointments: Provider: Department: Dept Phone: Center:   02/26/2012 10:45 AM Larina Earthly, MD [facility] [phone] [facility]   02/28/2012 11:45 AM [facility] [place] [phone] None     Future Orders Please Complete By Expires   Diet - low sodium heart healthy      Increase activity slowly      Call MD for:  temperature >100.4      Call MD for:  severe uncontrolled pain  Call MD for:  persistant nausea and vomiting      Discharge instructions      Comments:   Please be sure to follow up with your surgeon as indicated.  Also continue your routine appointments for dialysis.  F/u with PCP as needed should any new concerns arise.       Medication List     As of 02/20/2012  1:23 PM    STOP taking these medications         lanthanum 500 MG chewable tablet   Commonly known as: FOSRENOL      TAKE these medications         calcium acetate 667 MG capsule   Commonly known as: PHOSLO   Take 1,334-2,001 mg by mouth 3 (three) times daily with meals. Take 3 capsules with each of 3 daily meals, and 2 capsules with each of 2 daily snacks.      cinacalcet 60 MG  tablet   Commonly known as: SENSIPAR   Take 60 mg by mouth at bedtime.      ezetimibe 10 MG tablet   Commonly known as: ZETIA   Take 10 mg by mouth at bedtime.      gabapentin 100 MG capsule   Commonly known as: NEURONTIN   Take 100 mg by mouth at bedtime.      HYDROcodone-acetaminophen 5-500 MG per tablet   Commonly known as: VICODIN   Take 1 tablet by mouth every 6 (six) hours as needed. For pain      metoCLOPramide 10 MG tablet   Commonly known as: REGLAN   Take 1 tablet (10 mg total) by mouth 4 (four) times daily -  before meals and at bedtime.      ondansetron 4 MG tablet   Commonly known as: ZOFRAN   Take 1 tablet (4 mg total) by mouth every 6 (six) hours as needed for nausea.      pantoprazole 40 MG tablet   Commonly known as: PROTONIX   Take 1 tablet (40 mg total) by mouth daily.           Follow-up Information    Follow up with [facility] On 02/28/2012. (lap band clinic at 11:45 AM)    Contact information:   [address] [place] [address] [phone]          The results of significant diagnostics from this hospitalization (including imaging, microbiology, ancillary and laboratory) are listed below for reference.    Significant Diagnostic Studies: Dg Chest 2 View  02/15/2012  *RADIOLOGY REPORT*  Clinical Data: Abdominal pain and vomiting.  CHEST - 2 VIEW  Comparison: 01/14/2012.  Findings: Normal sized heart.  Clear lungs.  Stable right jugular double-lumen catheter.  Lower thoracic spine degenerative changes.  IMPRESSION: No acute abnormality.   Original Report Authenticated By: Beckie Salts, M.D.    Dg Abd 1 View  02/16/2012  *RADIOLOGY REPORT*  Clinical Data: Right lower quadrant abdominal pain.  ABDOMEN - 1 VIEW  Comparison: Plain films chest and abdomen 01/14/2012 and CT abdomen and pelvis 01/15/2012.  Findings: A lap band is again seen and appears unchanged.  The bowel gas pattern is normal.  No  free peritoneal air is identified. Suture material is noted in the left upper quadrant. Cholecystectomy clips are also identified.  IMPRESSION: No acute finding.   Original Report Authenticated By: Holley Dexter, M.D.    Ct Abdomen Pelvis W Contrast  02/16/2012  *RADIOLOGY REPORT*  Clinical Data: Central chest pain and left side abdominal pain. Nausea and vomiting.  Patient on dialysis.  CT ABDOMEN AND PELVIS WITH CONTRAST  Technique:  Multidetector CT imaging of the abdomen and pelvis was performed following the standard protocol during bolus administration of intravenous contrast.  Contrast: 80mL OMNIPAQUE IOHEXOL 300 MG/ML  SOLN  Comparison: CT abdomen and pelvis 01/15/2012.  Findings: Mild basilar atelectasis is noted.  No pleural or pericardial effusion.  A lap band is again identified and unchanged.  The patient is status post cholecystectomy.  Atrophic kidneys are noted.  The liver, spleen, right adrenal gland and pancreas appear normal. 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  A very few colonic diverticula are noted without diverticulitis.  Stomach, small bowel and appendix appear normal.  No lymphadenopathy or fluid. Small fat containing umbilical hernia noted.  There is no focal bony abnormality.  Small exostosis versus small enthesophyte off the posterior aspect the right left acetabulum is incidentally noted and unchanged.  IMPRESSION:  1.  No acute finding. 2.  Status post lap band placement and cholecystectomy. 3.  Chronic renal atrophy. 4.  Very small fat containing umbilical hernia.   Original Report Authenticated By: Holley Dexter, M.D.    Dg Esophagus  02/18/2012  *RADIOLOGY REPORT*  Clinical Data: 46 year old male with history of prior gastric bypass procedure and Lap-Band placement.  Nausea and vomiting.  ESOPHOGRAM/BARIUM SWALLOW  Technique:  Single contrast examination was performed using thin barium.  Fluoroscopy time:  1.36 minutes.  Comparison:  No priors.  Findings:   Lap-Band was orientated correctly from the 2 o'clock to 8 o'clock position.  During multiple swallow attempts barium passed immediately through the Lap-Band into what appeared to be a small gastric remnant lying beneath the Lap-Band.  This barium then immediately extended into the small bowel.   Multiple swallows were repeated yielding the same results.  IMPRESSION: 1.  Although the Lap-Band appears to be correctly oriented, the gastric remnant appears to lie beneath the Lap-Band. No signs to suggest obstruction from either the Lap-Band or at the level of the gastric bypass.   Original Report Authenticated By: Trudie Reed, M.D.     Microbiology: No results found for this or any previous visit (from the past 240 hour(s)).   Labs: Basic Metabolic Panel:  Lab 02/20/12 4098 02/19/12 0455 02/18/12 1191 02/17/12 0610 02/16/12 2121 02/15/12 2214  NA 138 138 138 140 -- 141  K 3.8 3.8 3.8 3.8 -- 3.5  CL 98 98 95* 97 -- 97  CO2 27 27 25 27  -- 26  GLUCOSE 89 88 121* 96 -- 152*  BUN 36* 25* 44* 29* -- 17  CREATININE 14.08* 10.66* 14.84* 11.76* 10.30* --  CALCIUM 8.8 9.4 10.7* 10.7* -- 10.9*  MG -- -- -- -- -- --  PHOS 6.1* 5.4* 6.9* -- -- --   Liver Function Tests:  Lab 02/20/12 0705 02/19/12 0455 02/18/12 0638 02/15/12 2214  AST -- -- -- 22  ALT -- -- -- 18  ALKPHOS -- -- -- 70  BILITOT -- -- -- 0.5  PROT -- -- -- 8.4*  ALBUMIN 3.4* 3.4* 3.7 4.2    Lab 02/16/12 0341  LIPASE 14  AMYLASE --   No results found for this basename: AMMONIA:5 in the last 168 hours CBC:  Lab 02/20/12 0705 02/19/12 0455 02/18/12 0638 02/17/12 0610 02/16/12 2121  WBC 7.4 8.2 11.8* 11.0* 15.4*  NEUTROABS -- -- -- -- --  HGB 10.8* 11.0* 12.1* 11.0* 13.3  HCT 32.9* 33.9* 35.7* 34.6* 41.5  MCV 94.0 95.8 93.0 97.2 96.7  PLT 142* 148* 178 183 193   Cardiac Enzymes: No results found for this basename: CKTOTAL:5,CKMB:5,CKMBINDEX:5,TROPONINI:5 in the last 168 hours BNP: BNP (last 3 results) No results found  for this basename: PROBNP:3 in the last 8760 hours CBG:  Lab 02/20/12 1222 02/19/12 2045 02/19/12 1632 02/19/12 1148 02/19/12 0738  GLUCAP 125* 93 131* 111* 95       Signed:  Penny Pia  [facility] 02/20/2012, 1:23 PM

## 2012-02-20 NOTE — Progress Notes (Signed)
Patient ID: Randall [name], male   DOB: 07/26/1965, 46 y.o.   MRN: [id number]   [facility] Progress Note    Subjective:   Reports to be feeling better and now able to tolerate oral intake without instant nausea/vomiting. Tolerated metoclopramide without any emerging side effects   Objective:   BP 136/77  Pulse 90  Temp 98.4 F (36.9 C) (Oral)  Resp 18  Ht 5\' 11"  (1.803 m)  Wt 118.5 kg (261 lb 3.9 oz)  BMI 36.44 kg/m2  SpO2 97%  Physical Exam: WUJ:[id number] resting on HD AOZ:HYQMV RRR, normal S1 and S2  Resp:CTA bilaterally, no rales/rhonchi HQI:ONGE, obese, non-tender, bowel sounds normal Ext:No LE edema  Labs: BMET  Lab 02/20/12 0705 02/19/12 0455 02/18/12 0638 02/17/12 0610 02/16/12 2121 02/15/12 2214  NA 138 138 138 140 -- 141  K 3.8 3.8 3.8 3.8 -- 3.5  CL 98 98 95* 97 -- 97  CO2 27 27 25 27  -- 26  GLUCOSE 89 88 121* 96 -- 152*  BUN 36* 25* 44* 29* -- 17  CREATININE 14.08* 10.66* 14.84* 11.76* 10.30* 7.28*  ALB -- -- -- -- -- --  CALCIUM 8.8 9.4 10.7* 10.7* -- 10.9*  PHOS 6.1* 5.4* 6.9* -- -- --   CBC  Lab 02/20/12 0705 02/19/12 0455 02/18/12 0638 02/17/12 0610  WBC 7.4 8.2 11.8* 11.0*  NEUTROABS -- -- -- --  HGB 10.8* 11.0* 12.1* 11.0*  HCT 32.9* 33.9* 35.7* 34.6*  MCV 94.0 95.8 93.0 97.2  PLT 142* 148* 178 183   Medications:      . calcium acetate  2,001 mg Oral TID WC  . cinacalcet  60 mg Oral QHS  . enoxaparin (LOVENOX) injection  30 mg Subcutaneous Q24H  . ezetimibe  10 mg Oral QHS  . gabapentin  100 mg Oral QHS  . insulin aspart  0-15 Units Subcutaneous TID WC  . insulin aspart  4 Units Subcutaneous TID WC  . lanthanum  2,000 mg Oral TID WC  . metoCLOPramide  10 mg Oral TID AC & HS  . pantoprazole  40 mg Oral QHS  . sodium chloride  3 mL Intravenous Q12H     Assessment/ Plan:   1. Intractable N and V - Noted to be improving on metoclopramide to help with gastric emptying phase (as the delay in gastric emptying was at the  outlet- suggestive of gastroparesis). He has been set up by surgery to follow up at the OP Band clinic this Friday with Dr. Daphine Deutscher. Anticipated Dc later today.  2. ESRD - MWF per routine - on 4 K bath due to K 3.8  3. Anemia - Hgb 10.8 - holding ESA  4. Secondary hyperparathyroidism - expresses some problems with swallowing binders and worsening of nausea/early satiety- advised to continue crushing fosrenol and consider switch to liquid Ca-acetate (Phoslyra)  5. HTN/volume - 2 kg below EDW - keeping even today - ^ BP likely related to pain; BP well controlled < 130 systolic as outpt.  6. Nutrition - Diet advanced without problems so far  7. Type 2 DM - BS stable    Zetta Bills, MD 02/20/2012, 9:42 AM

## 2012-02-25 ENCOUNTER — Encounter: Payer: Self-pay | Admitting: Vascular Surgery

## 2012-02-26 ENCOUNTER — Ambulatory Visit (INDEPENDENT_AMBULATORY_CARE_PROVIDER_SITE_OTHER): Payer: Medicare Other | Admitting: Vascular Surgery

## 2012-02-26 ENCOUNTER — Encounter: Payer: Self-pay | Admitting: Vascular Surgery

## 2012-02-26 VITALS — BP 135/70 | HR 109 | Resp 18 | Ht 71.0 in | Wt 267.5 lb

## 2012-02-26 DIAGNOSIS — N186 End stage renal disease: Secondary | ICD-10-CM

## 2012-02-26 NOTE — Progress Notes (Signed)
Patient is here today for followup of the superficial mobilization of his left upper arm AV fistula. This fistula was initially placed by myself in September. He had a rather deep running cephalic vein had several competing branches. One month ago he underwent ligation of these competing branches and superficial mobilization.  He has had very nice healing of this. Has no discomfort associated with this. He does have a good vein in size and this does lie close to the surface. I feel that he is ready for access of the fistula at any time. I did send a note to the dialysis center expressing this. Also stressed the importance of having only the most experienced access personnel access his fistula. He currently is dialyzed via a right IJ  dialysis catheter. This can be removed after several unsuccessful uses of his fistula

## 2012-02-28 ENCOUNTER — Encounter (INDEPENDENT_AMBULATORY_CARE_PROVIDER_SITE_OTHER): Payer: 59

## 2012-03-03 ENCOUNTER — Encounter: Payer: Self-pay | Admitting: Nephrology

## 2012-03-26 ENCOUNTER — Other Ambulatory Visit: Payer: Self-pay

## 2012-03-27 ENCOUNTER — Ambulatory Visit (HOSPITAL_COMMUNITY)
Admission: RE | Admit: 2012-03-27 | Discharge: 2012-03-27 | Disposition: A | Discharge status: Home / Self Care | Payer: 59 | Source: Ambulatory Visit | Attending: Vascular Surgery | Admitting: Vascular Surgery

## 2012-03-27 ENCOUNTER — Telehealth (INDEPENDENT_AMBULATORY_CARE_PROVIDER_SITE_OTHER): Payer: Self-pay

## 2012-03-27 DIAGNOSIS — Z4901 Encounter for fitting and adjustment of extracorporeal dialysis catheter: Secondary | ICD-10-CM | POA: Insufficient documentation

## 2012-03-27 DIAGNOSIS — N186 End stage renal disease: Secondary | ICD-10-CM | POA: Insufficient documentation

## 2012-03-27 DIAGNOSIS — E119 Type 2 diabetes mellitus without complications: Secondary | ICD-10-CM | POA: Insufficient documentation

## 2012-03-27 DIAGNOSIS — I120 Hypertensive chronic kidney disease with stage 5 chronic kidney disease or end stage renal disease: Secondary | ICD-10-CM | POA: Insufficient documentation

## 2012-03-27 NOTE — H&P (Signed)
[facility] SHORT STAY H&P  CC: ESRD   HPI: Randall [name] is a 47 y.o. male who has been on HD through  functioning Hemodialysis access in the left upper extremity. They are here for HD catheter removal. Pt. denies signs of steal syndrome.  Past Medical History  Diagnosis Date  . ESRD on hemodialysis     [facility] on MWF, ESRD due to DM/HTN  . Recurrent vomiting   . Hyperlipidemia   . Thyroid disorder     takes sensipar  . Diabetes mellitus     controlling by weight/diet  . 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 GERD (gastroesophageal reflux disease)   . Hypertension   . Hyperthyroidism     FH:  Non-Contributory  Social HX History  Substance Use Topics  . Smoking status: Never Smoker   . Smokeless tobacco: Never Used  . Alcohol Use: No    Allergies No Known Allergies  Medications Current Outpatient Prescriptions  Medication Sig Dispense Refill  . calcium acetate (PHOSLO) 667 MG capsule Take 1,334-2,001 mg by mouth 3 (three) times daily with meals. Take 3 capsules with each of 3 daily meals, and 2 capsules with each of 2 daily snacks.      . cinacalcet (SENSIPAR) 60 MG tablet Take 60 mg by mouth at bedtime.       . ezetimibe (ZETIA) 10 MG tablet Take 10 mg by mouth at bedtime.       . gabapentin (NEURONTIN) 100 MG capsule Take 100 mg by mouth at bedtime.      . HYDROcodone-acetaminophen (VICODIN) 5-500 MG per tablet Take 1 tablet by mouth every 6 (six) hours as needed. For pain  30 tablet  0  . metoCLOPramide (REGLAN) 10 MG tablet Take 1 tablet (10 mg total) by mouth 4 (four) times daily -  before meals and at bedtime.  120 tablet  0  . ondansetron (ZOFRAN) 4 MG tablet Take 1 tablet (4 mg total) by mouth every 6 (six) hours as needed for nausea.  20 tablet  0  . pantoprazole (PROTONIX) 40 MG tablet Take 1 tablet (40 mg total) by mouth daily.  30 tablet  0    Labs COAG Lab Results  Component Value Date   INR 1.21 12/05/2011   INR 1.14  11/19/2010   No results found for this basename: PTT    PHYSICAL EXAM  Filed Vitals:   03/27/12 1139  BP: 143/91  Pulse:   Temp:   Resp:     General:  WDWN in NAD HENT: WNL Eyes: Pupils equal Pulmonary: normal non-labored breathing  Cardiac: RRR, Skin: normal, no cyanosis, jaundice, pallor or bruising Vascular Exam/Pulses: 2+ radial pulses in LEFT upper extremity. Extremities without ischemic changes, no Gangrene , no cellulitis; no open wounds;   There is a good thrill and good bruit in the left BC AVF. Hand grip is 5/5 and sensation in digits is intact;   Impression: This is a 47 y.o. male who has a functioning HD access.  Plan: Removal of Right IJ HD catheter Bobbi Yount J @TODAY@ 11:44 AM    

## 2012-03-27 NOTE — Progress Notes (Signed)
[facility] Catheter Removal Procedure Note  Diagnosis: ESRD with Functioning AVF/AVGG  Plan:  Remove right diatek catheter  Consent signed:  yes Time out completed:  yes Coumadin:  no PT/INR (if applicable):   Other labs:   Procedure: 1.  Sterile prepping and draping over catheter area 3.  right catheter removed in its entirety with cuff in tact. 4.  Complications: none  5. Tip of catheter sent for culture:  no   Patient tolerated procedure well:  yes Pressure held, no bleeding noted, dressing applied Instructions given to the pt regarding wound care and bleeding.  Other:  Randall [name] 03/27/2012 11:48 AM

## 2012-03-27 NOTE — Progress Notes (Deleted)
[facility] SHORT STAY H&P  CC: ESRD   HPI: Randall [name] is a 47 y.o. male who has been on HD through  functioning Hemodialysis access in the left upper extremity. They are here for HD catheter removal. Pt. denies signs of steal syndrome.  Past Medical History  Diagnosis Date  . ESRD on hemodialysis     [place] [facility] on MWF, ESRD due to DM/HTN  . Recurrent vomiting   . Hyperlipidemia   . Thyroid disorder     takes sensipar  . Diabetes mellitus     controlling by weight/diet  . 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 GERD (gastroesophageal reflux disease)   . Hypertension   . Hyperthyroidism     FH:  Non-Contributory  Social HX History  Substance Use Topics  . Smoking status: Never Smoker   . Smokeless tobacco: Never Used  . Alcohol Use: No    Allergies No Known Allergies  Medications Current Outpatient Prescriptions  Medication Sig Dispense Refill  . calcium acetate (PHOSLO) 667 MG capsule Take 1,334-2,001 mg by mouth 3 (three) times daily with meals. Take 3 capsules with each of 3 daily meals, and 2 capsules with each of 2 daily snacks.      . cinacalcet (SENSIPAR) 60 MG tablet Take 60 mg by mouth at bedtime.       Marland Kitchen ezetimibe (ZETIA) 10 MG tablet Take 10 mg by mouth at bedtime.       . gabapentin (NEURONTIN) 100 MG capsule Take 100 mg by mouth at bedtime.      Marland Kitchen HYDROcodone-acetaminophen (VICODIN) 5-500 MG per tablet Take 1 tablet by mouth every 6 (six) hours as needed. For pain  30 tablet  0  . metoCLOPramide (REGLAN) 10 MG tablet Take 1 tablet (10 mg total) by mouth 4 (four) times daily -  before meals and at bedtime.  120 tablet  0  . ondansetron (ZOFRAN) 4 MG tablet Take 1 tablet (4 mg total) by mouth every 6 (six) hours as needed for nausea.  20 tablet  0  . pantoprazole (PROTONIX) 40 MG tablet Take 1 tablet (40 mg total) by mouth daily.  30 tablet  0    Labs COAG Lab Results  Component Value Date   INR 1.21 12/05/2011   INR 1.14  11/19/2010   No results found for this basename: PTT    PHYSICAL EXAM  Filed Vitals:   03/27/12 1139  BP: 143/91  Pulse:   Temp:   Resp:     General:  WDWN in NAD HENT: WNL Eyes: Pupils equal Pulmonary: normal non-labored breathing  Cardiac: RRR, Skin: normal, no cyanosis, jaundice, pallor or bruising Vascular Exam/Pulses: 2+ radial pulses in LEFT upper extremity. Extremities without ischemic changes, no Gangrene , no cellulitis; no open wounds;   There is a good thrill and good bruit in the left [facility] AVF. Hand grip is 5/5 and sensation in digits is intact;   Impression: This is a 47 y.o. male who has a functioning HD access.  Plan: Removal of Right IJ HD catheter Veeda Virgo J @TODAY @ 11:44 AM

## 2012-03-27 NOTE — Telephone Encounter (Signed)
Spoke with pt in regards to MM canceling his office for tomorrow.  I explained that MM has no spots available to r/s at the current time, but that I was hopeful that some will come up soon.  He appreciated my call.

## 2012-03-27 NOTE — H&P (Signed)
Agree with above.  Cari Caraway Beeper [phone] 03/27/2012

## 2012-03-28 ENCOUNTER — Ambulatory Visit (INDEPENDENT_AMBULATORY_CARE_PROVIDER_SITE_OTHER): Payer: 59 | Admitting: Surgery

## 2012-03-31 ENCOUNTER — Encounter (INDEPENDENT_AMBULATORY_CARE_PROVIDER_SITE_OTHER): Payer: Self-pay | Admitting: Surgery

## 2012-03-31 ENCOUNTER — Ambulatory Visit (INDEPENDENT_AMBULATORY_CARE_PROVIDER_SITE_OTHER): Payer: 59 | Admitting: Surgery

## 2012-03-31 VITALS — BP 132/84 | HR 96 | Temp 98.6°F | Resp 16 | Ht 71.0 in | Wt 265.2 lb

## 2012-03-31 DIAGNOSIS — R229 Localized swelling, mass and lump, unspecified: Secondary | ICD-10-CM

## 2012-03-31 DIAGNOSIS — Z4651 Encounter for fitting and adjustment of gastric lap band: Secondary | ICD-10-CM

## 2012-03-31 DIAGNOSIS — Z9884 Bariatric surgery status: Secondary | ICD-10-CM

## 2012-03-31 NOTE — Patient Instructions (Signed)

## 2012-03-31 NOTE — Progress Notes (Signed)
Chistian P Daughtridge Body mass index is 36.99 kg/(m^2).  Having regurgitation:  no  Nocturnal reflux?  no  Amount of fill  1.5 cc [place] is on the transplant waiting list. He came in today because of a area that Dr. Eliott Nine had felt on his right side.  I palpated the area in question and he has a lot of granular fat in a lateral area. I don't feel any hernia. I would not recommend any surgery for this while he is waiting for his kidney and if it should develop in the something that can be managed that time. But again I don't feel anything there is oriented in investigation at this time.  Next he was concerned about weight regain and is pending kidney transplant. He requested a lap band fill. When an adequate 1.5 cc Tuesday and. I put him down to see back in 3 months.

## 2012-04-29 ENCOUNTER — Emergency Department (HOSPITAL_COMMUNITY)
Admission: EM | Admit: 2012-04-29 | Discharge: 2012-04-29 | Disposition: A | Discharge status: Home / Self Care | Payer: 59 | Attending: Emergency Medicine | Admitting: Emergency Medicine

## 2012-04-29 ENCOUNTER — Encounter (HOSPITAL_COMMUNITY): Payer: Self-pay

## 2012-04-29 ENCOUNTER — Emergency Department (HOSPITAL_COMMUNITY): Payer: 59

## 2012-04-29 DIAGNOSIS — I120 Hypertensive chronic kidney disease with stage 5 chronic kidney disease or end stage renal disease: Secondary | ICD-10-CM | POA: Insufficient documentation

## 2012-04-29 DIAGNOSIS — R1084 Generalized abdominal pain: Secondary | ICD-10-CM | POA: Insufficient documentation

## 2012-04-29 DIAGNOSIS — E039 Hypothyroidism, unspecified: Secondary | ICD-10-CM | POA: Insufficient documentation

## 2012-04-29 DIAGNOSIS — N186 End stage renal disease: Secondary | ICD-10-CM | POA: Insufficient documentation

## 2012-04-29 DIAGNOSIS — R109 Unspecified abdominal pain: Secondary | ICD-10-CM

## 2012-04-29 DIAGNOSIS — K219 Gastro-esophageal reflux disease without esophagitis: Secondary | ICD-10-CM | POA: Insufficient documentation

## 2012-04-29 DIAGNOSIS — Z79899 Other long term (current) drug therapy: Secondary | ICD-10-CM | POA: Insufficient documentation

## 2012-04-29 DIAGNOSIS — E119 Type 2 diabetes mellitus without complications: Secondary | ICD-10-CM | POA: Insufficient documentation

## 2012-04-29 DIAGNOSIS — R112 Nausea with vomiting, unspecified: Secondary | ICD-10-CM | POA: Insufficient documentation

## 2012-04-29 LAB — COMPREHENSIVE METABOLIC PANEL
ALT: 24 U/L (ref 0–53)
Albumin: 4.3 g/dL (ref 3.5–5.2)
Alkaline Phosphatase: 110 U/L (ref 39–117)
Calcium: 9.9 mg/dL (ref 8.4–10.5)
Potassium: 4.5 mEq/L (ref 3.5–5.1)
Sodium: 139 mEq/L (ref 135–145)
Total Protein: 9.4 g/dL — ABNORMAL HIGH (ref 6.0–8.3)

## 2012-04-29 LAB — CBC WITH DIFFERENTIAL/PLATELET
Basophils Absolute: 0 10*3/uL (ref 0.0–0.1)
Basophils Relative: 0 % (ref 0–1)
Eosinophils Absolute: 0 10*3/uL (ref 0.0–0.7)
Eosinophils Relative: 0 % (ref 0–5)
MCH: 31 pg (ref 26.0–34.0)
MCHC: 32.8 g/dL (ref 30.0–36.0)
Neutrophils Relative %: 84 % — ABNORMAL HIGH (ref 43–77)
Platelets: 194 10*3/uL (ref 150–400)
RBC: 4.35 MIL/uL (ref 4.22–5.81)
RDW: 14 % (ref 11.5–15.5)

## 2012-04-29 MED ORDER — HYDROMORPHONE HCL PF 2 MG/ML IJ SOLN
2.0000 mg | Freq: Once | INTRAMUSCULAR | Status: AC
  Administered 2012-04-29: 2 mg via INTRAMUSCULAR
  Filled 2012-04-29: qty 1

## 2012-04-29 MED ORDER — PROMETHAZINE HCL 25 MG/ML IJ SOLN
25.0000 mg | Freq: Once | INTRAMUSCULAR | Status: AC
  Administered 2012-04-29: 25 mg via INTRAMUSCULAR
  Filled 2012-04-29: qty 1

## 2012-04-29 MED ORDER — MORPHINE SULFATE 4 MG/ML IJ SOLN
4.0000 mg | Freq: Once | INTRAMUSCULAR | Status: AC
  Administered 2012-04-29: 4 mg via INTRAMUSCULAR
  Filled 2012-04-29: qty 1

## 2012-04-29 MED ORDER — PROMETHAZINE HCL 25 MG/ML IJ SOLN
25.0000 mg | Freq: Once | INTRAMUSCULAR | Status: DC
  Filled 2012-04-29: qty 1

## 2012-04-29 MED ORDER — SODIUM CHLORIDE 0.9 % IV BOLUS (SEPSIS): 500.0000 mL | Freq: Once | INTRAVENOUS | Status: DC

## 2012-04-29 MED ORDER — DICYCLOMINE HCL 20 MG PO TABS: 20.0000 mg | ORAL_TABLET | Freq: Two times a day (BID) | ORAL | Status: DC

## 2012-04-29 MED ORDER — MORPHINE SULFATE 4 MG/ML IJ SOLN: 4.0000 mg | Freq: Once | INTRAMUSCULAR | Status: DC

## 2012-04-29 MED ORDER — ONDANSETRON 4 MG PO TBDP: 8.0000 mg | ORAL_TABLET | Freq: Three times a day (TID) | ORAL | Status: DC | PRN

## 2012-04-29 NOTE — ED Notes (Signed)
Pt reminded we need urine sample, pt sts he does not produce urine anymore.

## 2012-04-29 NOTE — ED Provider Notes (Signed)
History     CSN: [id number]  Arrival date & time 04/29/12  1003   First MD Initiated Contact with Patient 04/29/12 1036      Chief Complaint  Patient presents with  . Abdominal Pain  . Emesis    (Consider location/radiation/quality/duration/timing/severity/associated sxs/prior treatment) HPI Comments: Patient is a 47 year old male with a past medical history of ESRD, lapband surgery in 2011, and hypertension who presents with abdominal pain that started last night. The pain is located in his generalized abdomen and does not radiate. The pain is described as cramping and severe. The pain started gradually and progressively worsened since the onset. No alleviating/aggravating factors. The patient has tried Vicodin and Phenergan for symptoms without relief. Patient could not keep the medications down due to nausea and vomiting.  Associated symptoms include nausea and vomiting. Patient denies fever, headache, diarrhea, chest pain, SOB, dysuria, constipation.   Past Medical History  Diagnosis Date  . ESRD on hemodialysis     [facility] [facility] on MWF, ESRD due to DM/HTN  . Recurrent vomiting   . Hyperlipidemia   . Thyroid disorder     takes sensipar  . Diabetes mellitus     controlling by weight/diet  . 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 GERD (gastroesophageal reflux disease)   . Hypertension   . Hyperthyroidism     Past Surgical History  Procedure Laterality Date  . Gastric restriction surgery  12/06/09    lap band  . Dialysis fistula creation  2011  . Insertion of dialysis catheter  11/13/2011    Procedure: INSERTION OF DIALYSIS CATHETER;  Surgeon: Sherren Kerns, MD;  Location: [facility] OR;  Service: Vascular;  Laterality: Right;  Insertion of 23cm dialysis catheter in right IJ  . Av fistula placement  12/05/2011    Procedure: ARTERIOVENOUS (AV) FISTULA CREATION;  Surgeon: Larina Earthly, MD;  Location: [facility] OR;  Service: Vascular;  Laterality: Left;  . Av fistula placement   01-23-2012    #1 ligation of 2 competing branches of left upper arm AVF   #2  superifical mobilization of left upper arm AVF     Family History  Problem Relation Age of Onset  . Hypertension Mother   . Diabetes Mother   . Hypertension Father   . Diabetes Father     History  Substance Use Topics  . Smoking status: Never Smoker   . Smokeless tobacco: Never Used  . Alcohol Use: No      Review of Systems  Gastrointestinal: Positive for nausea, vomiting and abdominal pain.  All other systems reviewed and are negative.    Allergies  Review of patient's allergies indicates no known allergies.  Home Medications   Current Outpatient Rx  Name  Route  Sig  Dispense  Refill  . calcium acetate (PHOSLO) 667 MG capsule   Oral   Take 1,334-2,001 mg by mouth 3 (three) times daily with meals. Take 3 capsules with each of 3 daily meals, and 2 capsules with each of 2 daily snacks.         . ezetimibe (ZETIA) 10 MG tablet   Oral   Take 10 mg by mouth at bedtime.          . gabapentin (NEURONTIN) 100 MG capsule   Oral   Take 100 mg by mouth at bedtime.         Marland Kitchen HYDROcodone-acetaminophen (VICODIN) 5-500 MG per tablet   Oral   Take 1 tablet by  mouth every 6 (six) hours as needed for pain. For pain         . metoCLOPramide (REGLAN) 10 MG tablet   Oral   Take 10 mg by mouth 4 (four) times daily -  before meals and at bedtime.         . ondansetron (ZOFRAN) 4 MG tablet   Oral   Take 1 tablet (4 mg total) by mouth every 6 (six) hours as needed for nausea.   20 tablet   0   . pantoprazole (PROTONIX) 40 MG tablet   Oral   Take 1 tablet (40 mg total) by mouth daily.   30 tablet   0     BP 163/92  Pulse 103  Temp(Src) 97.7 F (36.5 C) (Oral)  Resp 18  SpO2 100%  Physical Exam  Nursing note and vitals reviewed. Constitutional: He is oriented to person, place, and time. He appears well-developed and well-nourished. No distress.  HENT:  Head: Normocephalic and  atraumatic.  Eyes: Conjunctivae and EOM are normal. No scleral icterus.  Neck: Normal range of motion. Neck supple.  Cardiovascular: Normal rate and regular rhythm.  Exam reveals no gallop and no friction rub.   No murmur heard. Pulmonary/Chest: Effort normal and breath sounds normal. He has no wheezes. He has no rales. He exhibits no tenderness.  Abdominal: Soft. He exhibits no distension. There is tenderness. There is no rebound and no guarding.  Generalized tenderness to palpation. No peritoneal signs.   Musculoskeletal: Normal range of motion.  Neurological: He is alert and oriented to person, place, and time. Coordination normal.  Speech is goal-oriented. Moves limbs without ataxia.   Skin: Skin is warm and dry.  Psychiatric: He has a normal mood and affect. His behavior is normal.    ED Course  Procedures (including critical care time)  Labs Reviewed  CBC WITH DIFFERENTIAL - Abnormal; Notable for the following:    WBC 12.6 (*)    Neutrophils Relative 84 (*)    Neutro Abs 10.6 (*)    Lymphocytes Relative 9 (*)    All other components within normal limits  COMPREHENSIVE METABOLIC PANEL - Abnormal; Notable for the following:    Chloride 92 (*)    Glucose, Bld 128 (*)    BUN 34 (*)    Creatinine, Ser 10.86 (*)    Total Protein 9.4 (*)    GFR calc non Af Amer 5 (*)    GFR calc Af Amer 6 (*)    All other components within normal limits  URINALYSIS, ROUTINE W REFLEX MICROSCOPIC   Ct Abdomen Pelvis Wo Contrast  04/29/2012  *RADIOLOGY REPORT*  Clinical Data: Abdominal pain, vomiting.  CT ABDOMEN AND PELVIS WITHOUT CONTRAST  Technique:  Multidetector CT imaging of the abdomen and pelvis was performed following the standard protocol without intravenous contrast.  Comparison: 02/16/2012  Findings: Lung bases are clear.  No effusions.  Heart is normal size.  Prior gastric band.  Prior cholecystectomy.  Liver, spleen, pancreas, adrenals have an unremarkable unenhanced appearance.  No  hydronephrosis within the kidneys.  Kidneys appear mildly atrophic with approximate craniocaudal length of 8 cm bilaterally.  No renal or ureteral stones.  Appendix is visualized and is normal.  Large and small bowel grossly unremarkable.  Aorta is normal caliber.  No free fluid, free air or adenopathy.  Urinary bladder decompressed, grossly unremarkable.  No acute bony abnormality.  IMPRESSION: No acute findings in the abdomen or pelvis on this unenhanced study.  Original Report Authenticated By: Charlett Nose, M.D.      1. Abdominal pain       MDM  11:13 AM Labs pending. Patient will have fluids, morphine and phenergan for symptoms.   2:52 PM Patient will have IM dilaudid for pain. CT abdomen pelvis pending. Labs show elevated WBC and are consistent with ESRD. Urinalysis still pending.   6:31 PM CT scan unremarkable for acute changes. Patient states he is feeling better. I will discharge the patient with bentyl and ODT zofran. Vitals stable and patient is afebrile. No further evaluation needed at this time. Patient instructed to return with worsening or concerning symptoms.      Emilia Beck, PA-C 04/29/12 1837

## 2012-04-29 NOTE — ED Notes (Signed)
Per EMS- Patient was at [facility] for c/o abdominal pain and vomiting not controlled by meds that he has at home. Patient had a Lap Band procedure 2 years ago. Patient unable to to tolerate Phenergan po and Vicodin po at home due to vomiting. Patient was given Phenergan 25 mg IM at 0935 and had relief for a short time. Patient currently nauseated.

## 2012-04-29 NOTE — ED Notes (Signed)
RN x3 attempted IV start. Requested start by IV RN.

## 2012-04-30 ENCOUNTER — Encounter (HOSPITAL_COMMUNITY): Payer: Self-pay | Admitting: *Deleted

## 2012-04-30 ENCOUNTER — Observation Stay (HOSPITAL_COMMUNITY)
Admission: EM | Admit: 2012-04-30 | Discharge: 2012-05-02 | Disposition: A | Discharge status: Home / Self Care | Payer: 59 | Attending: Internal Medicine | Admitting: Internal Medicine

## 2012-04-30 ENCOUNTER — Emergency Department (HOSPITAL_COMMUNITY): Payer: 59

## 2012-04-30 DIAGNOSIS — Z992 Dependence on renal dialysis: Secondary | ICD-10-CM | POA: Insufficient documentation

## 2012-04-30 DIAGNOSIS — R1115 Cyclical vomiting syndrome unrelated to migraine: Secondary | ICD-10-CM

## 2012-04-30 DIAGNOSIS — K219 Gastro-esophageal reflux disease without esophagitis: Secondary | ICD-10-CM | POA: Insufficient documentation

## 2012-04-30 DIAGNOSIS — R1110 Vomiting, unspecified: Secondary | ICD-10-CM

## 2012-04-30 DIAGNOSIS — Z9884 Bariatric surgery status: Secondary | ICD-10-CM | POA: Insufficient documentation

## 2012-04-30 DIAGNOSIS — R109 Unspecified abdominal pain: Secondary | ICD-10-CM

## 2012-04-30 DIAGNOSIS — I120 Hypertensive chronic kidney disease with stage 5 chronic kidney disease or end stage renal disease: Secondary | ICD-10-CM | POA: Insufficient documentation

## 2012-04-30 DIAGNOSIS — E119 Type 2 diabetes mellitus without complications: Secondary | ICD-10-CM | POA: Insufficient documentation

## 2012-04-30 DIAGNOSIS — R112 Nausea with vomiting, unspecified: Secondary | ICD-10-CM | POA: Insufficient documentation

## 2012-04-30 DIAGNOSIS — E875 Hyperkalemia: Secondary | ICD-10-CM | POA: Insufficient documentation

## 2012-04-30 DIAGNOSIS — N186 End stage renal disease: Secondary | ICD-10-CM

## 2012-04-30 DIAGNOSIS — I10 Essential (primary) hypertension: Secondary | ICD-10-CM

## 2012-04-30 LAB — CBC WITH DIFFERENTIAL/PLATELET
Basophils Absolute: 0 10*3/uL (ref 0.0–0.1)
Eosinophils Absolute: 0 10*3/uL (ref 0.0–0.7)
Eosinophils Relative: 0 % (ref 0–5)
MCH: 31.5 pg (ref 26.0–34.0)
MCV: 95.8 fL (ref 78.0–100.0)
Neutrophils Relative %: 73 % (ref 43–77)
Platelets: 152 10*3/uL (ref 150–400)
RDW: 14.7 % (ref 11.5–15.5)
WBC: 10.5 10*3/uL (ref 4.0–10.5)

## 2012-04-30 LAB — COMPREHENSIVE METABOLIC PANEL
ALT: 41 U/L (ref 0–53)
AST: 64 U/L — ABNORMAL HIGH (ref 0–37)
Albumin: 4.2 g/dL (ref 3.5–5.2)
Calcium: 9.3 mg/dL (ref 8.4–10.5)
Sodium: 135 mEq/L (ref 135–145)
Total Protein: 9.4 g/dL — ABNORMAL HIGH (ref 6.0–8.3)

## 2012-04-30 MED ORDER — HYDROMORPHONE HCL PF 1 MG/ML IJ SOLN
1.0000 mg | Freq: Once | INTRAMUSCULAR | Status: AC
  Administered 2012-04-30: 1 mg via INTRAMUSCULAR
  Filled 2012-04-30: qty 1

## 2012-04-30 MED ORDER — ONDANSETRON HCL 4 MG/2ML IJ SOLN: 4.0000 mg | Freq: Once | INTRAMUSCULAR | Status: DC

## 2012-04-30 MED ORDER — ONDANSETRON 4 MG PO TBDP
4.0000 mg | ORAL_TABLET | Freq: Once | ORAL | Status: AC
  Administered 2012-04-30: 4 mg via ORAL
  Filled 2012-04-30: qty 1

## 2012-04-30 MED ORDER — SODIUM CHLORIDE 0.9 % IJ SOLN
3.0000 mL | Freq: Two times a day (BID) | INTRAMUSCULAR | Status: DC
  Administered 2012-04-30 – 2012-05-02 (×4): 3 mL via INTRAVENOUS

## 2012-04-30 MED ORDER — CALCIUM ACETATE 667 MG PO CAPS
1334.0000 mg | ORAL_CAPSULE | Freq: Three times a day (TID) | ORAL | Status: DC
  Administered 2012-05-01: 1334 mg via ORAL
  Filled 2012-04-30 (×4): qty 3

## 2012-04-30 MED ORDER — HEPARIN SODIUM (PORCINE) 5000 UNIT/ML IJ SOLN
5000.0000 [IU] | Freq: Three times a day (TID) | INTRAMUSCULAR | Status: DC
  Administered 2012-04-30 – 2012-05-01 (×4): 5000 [IU] via SUBCUTANEOUS
  Filled 2012-04-30 (×8): qty 1

## 2012-04-30 MED ORDER — ONDANSETRON HCL 4 MG/2ML IJ SOLN: 4.0000 mg | Freq: Four times a day (QID) | INTRAMUSCULAR | Status: DC | PRN

## 2012-04-30 MED ORDER — ACETAMINOPHEN 650 MG RE SUPP: 650.0000 mg | Freq: Four times a day (QID) | RECTAL | Status: DC | PRN

## 2012-04-30 MED ORDER — ACETAMINOPHEN 325 MG PO TABS: 650.0000 mg | ORAL_TABLET | Freq: Four times a day (QID) | ORAL | Status: DC | PRN

## 2012-04-30 MED ORDER — ONDANSETRON HCL 4 MG PO TABS: 4.0000 mg | ORAL_TABLET | Freq: Four times a day (QID) | ORAL | Status: DC | PRN

## 2012-04-30 MED ORDER — HYDROMORPHONE HCL PF 1 MG/ML IJ SOLN
1.0000 mg | INTRAMUSCULAR | Status: DC | PRN
  Administered 2012-04-30 – 2012-05-01 (×2): 1 mg via INTRAVENOUS
  Filled 2012-04-30 (×2): qty 1

## 2012-04-30 NOTE — ED Notes (Signed)
Pt completed full dose of dialysis prior to arrival to ED, went home and began feeling weak and short of breath.  Pt also complaining of LUQ abdominal pain.

## 2012-04-30 NOTE — Consult Note (Signed)
  47 y.o. Male with multiple medical problems, comes in with nausea and vomiting.  CT negative for obstruction.  Just had fill of his Gastric Bypass port about one week ago, now cannot hold down any food or liquids.  Unusually the patient has more pain that one would expect just from a tight bypass ring.  He does not have peritonitis.  Will get Hubbard needle to bedside for port aspiration in the AM.  Marta Lamas. Gae Bon, MD, FACS [phone] [facility] Surgery

## 2012-04-30 NOTE — ED Provider Notes (Signed)
Medical screening examination/treatment/procedure(s) were performed by non-physician practitioner and as supervising physician I was immediately available for consultation/collaboration.   Dione Booze, MD 04/30/12 [phone]

## 2012-04-30 NOTE — ED Notes (Signed)
Simone Curia (friend) [phone]

## 2012-04-30 NOTE — ED Notes (Signed)
Pt reports having dialysis today, then started having severe abd pain, cp and sob. Had n/v earlier today but denies diarrhea. Pt feels very weak and near syncope at triage, ekg being done.

## 2012-04-30 NOTE — H&P (Signed)
[facility] History and Physical  Randall [name] ZOX:[id number] DOB: Mar 06, 1966 DOA: 04/30/2012  Referring physician: ER physician. PCP: [name]., MD  Specialists: Nephrologist.  Chief Complaint: Abdominal pain with nausea and vomiting.  HPI: Randall [name] is a 47 y.o. male history of ESRD on hemodialysis and lap band surgery presents with complaints of nausea vomiting. Patient also has abdominal pain in the left upper quadrant. Patient had similar symptoms yesterday had come to the ER and CT abdomen pelvis without contrast was unremarkable. The patient's symptoms improved with pain medication and was sent home. Today after dialysis patient started developing similar symptoms with multiple episodes of vomiting had come to the ER. Acute abdominal series unremarkable and patient at this time has been admitted for further management.  Review of Systems: The patient denies anorexia, fever, weight loss,, vision loss, decreased hearing, hoarseness, chest pain, syncope, dyspnea on exertion, peripheral edema, balance deficits, hemoptysis, melena, hematochezia, severe indigestion/heartburn, hematuria, incontinence, genital sores, muscle weakness, suspicious skin lesions, transient blindness, difficulty walking, depression, unusual weight change, abnormal bleeding, enlarged lymph nodes, angioedema, and breast masses. Has left upper quadrant pain with nausea vomiting.  Past Medical History  Diagnosis Date  . ESRD on hemodialysis     [facility] on MWF, ESRD due to DM/HTN  . Recurrent vomiting   . Hyperlipidemia   . Thyroid disorder     takes sensipar  . Diabetes mellitus     controlling by weight/diet  . 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 GERD (gastroesophageal reflux disease)   . Hypertension   . Hyperthyroidism    Past Surgical History  Procedure Laterality Date  . Gastric restriction surgery  12/06/09    lap band  . Dialysis fistula creation  2011  . Insertion of  dialysis catheter  11/13/2011    Procedure: INSERTION OF DIALYSIS CATHETER;  Surgeon: Sherren Kerns, MD;  Location: [facility] OR;  Service: Vascular;  Laterality: Right;  Insertion of 23cm dialysis catheter in right IJ  . Av fistula placement  12/05/2011    Procedure: ARTERIOVENOUS (AV) FISTULA CREATION;  Surgeon: Larina Earthly, MD;  Location: [facility] OR;  Service: Vascular;  Laterality: Left;  . Av fistula placement  01-23-2012    #1 ligation of 2 competing branches of left upper arm AVF   #2  superifical mobilization of left upper arm AVF    Social History:  reports that he has never smoked. He has never used smokeless tobacco. He reports that he does not drink alcohol or use illicit drugs. Lives at home. where does patient live--home, ALF, SNF? and with whom if at home? Can do ADLs. Can patient participate in ADLs?  No Known Allergies  Family History  Problem Relation Age of Onset  . Hypertension Mother   . Diabetes Mother   . Hypertension Father   . Diabetes Father       Prior to Admission medications   Medication Sig Start Date End Date Taking? Authorizing Provider  calcium acetate (PHOSLO) 667 MG capsule Take 1,334-2,001 mg by mouth 3 (three) times daily with meals. Take 3 capsules with each of 3 daily meals, and 2 capsules with each of 2 daily snacks.   Yes Historical Provider, MD  dicyclomine (BENTYL) 20 MG tablet Take 1 tablet (20 mg total) by mouth 2 (two) times daily. 04/29/12  Yes Kaitlyn Szekalski, PA-C  ezetimibe (ZETIA) 10 MG tablet Take 10 mg by mouth at bedtime.    Yes Historical Provider, MD  gabapentin (NEURONTIN) 100 MG capsule Take 100 mg by mouth at bedtime.   Yes Historical Provider, MD  HYDROcodone-acetaminophen (VICODIN) 5-500 MG per tablet Take 1 tablet by mouth every 6 (six) hours as needed for pain. For pain 01/23/12  Yes Marlowe Shores, PA  metoCLOPramide (REGLAN) 10 MG tablet Take 10 mg by mouth 4 (four) times daily -  before meals and at bedtime. 02/20/12  Yes Penny Pia, MD  ondansetron (ZOFRAN ODT) 4 MG disintegrating tablet Take 2 tablets (8 mg total) by mouth every 8 (eight) hours as needed for nausea. 04/29/12  Yes Kaitlyn Szekalski, PA-C  pantoprazole (PROTONIX) 40 MG tablet Take 1 tablet (40 mg total) by mouth daily. 01/17/12  Yes Shanker Levora Dredge, MD   Physical Exam: Filed Vitals:   04/30/12 1659 04/30/12 1900  BP: 142/96 146/75  Pulse: 104 90  Temp: 98.1 F (36.7 C)   TempSrc: Oral   Resp: 18   SpO2: 100% 98%     General:  Well-built and nourished.  Eyes: Anicteric no pallor.  ENT: No discharge from ears eyes nose and mouth.  Neck: No mass felt.  Cardiovascular: S1-S2 heard.  Respiratory: No rhonchi no crepitations.  Abdomen: Soft mild tenderness in the left upper quadrant. Bowel sounds present.  Skin: No rash.  Musculoskeletal: Nontender.  Psychiatric: Appears normal.  Neurologic: Moves all extremities.  Labs on Admission:  Basic Metabolic Panel:  Recent Labs Lab 04/29/12 1157 04/30/12 1747 04/30/12 1916  NA 139 135  --   K 4.5 5.9* 4.3  CL 92* 91*  --   CO2 28 27  --   GLUCOSE 128* 117*  --   BUN 34* 18  --   CREATININE 10.86* 6.72*  --   CALCIUM 9.9 9.3  --    Liver Function Tests:  Recent Labs Lab 04/29/12 1157 04/30/12 1747  AST 18 64*  ALT 24 41  ALKPHOS 110 103  BILITOT 0.4 0.4  PROT 9.4* 9.4*  ALBUMIN 4.3 4.2    Recent Labs Lab 04/30/12 1747  LIPASE 31   No results found for this basename: AMMONIA,  in the last 168 hours CBC:  Recent Labs Lab 04/29/12 1157 04/30/12 1747  WBC 12.6* 10.5  NEUTROABS 10.6* 7.6  HGB 13.5 14.2  HCT 41.1 43.2  MCV 94.5 95.8  PLT 194 152   Cardiac Enzymes: No results found for this basename: CKTOTAL, CKMB, CKMBINDEX, TROPONINI,  in the last 168 hours  BNP (last 3 results) No results found for this basename: PROBNP,  in the last 8760 hours CBG: No results found for this basename: GLUCAP,  in the last 168 hours  Radiological Exams on  Admission: Ct Abdomen Pelvis Wo Contrast  04/29/2012  *RADIOLOGY REPORT*  Clinical Data: Abdominal pain, vomiting.  CT ABDOMEN AND PELVIS WITHOUT CONTRAST  Technique:  Multidetector CT imaging of the abdomen and pelvis was performed following the standard protocol without intravenous contrast.  Comparison: 02/16/2012  Findings: Lung bases are clear.  No effusions.  Heart is normal size.  Prior gastric band.  Prior cholecystectomy.  Liver, spleen, pancreas, adrenals have an unremarkable unenhanced appearance.  No hydronephrosis within the kidneys.  Kidneys appear mildly atrophic with approximate craniocaudal length of 8 cm bilaterally.  No renal or ureteral stones.  Appendix is visualized and is normal.  Large and small bowel grossly unremarkable.  Aorta is normal caliber.  No free fluid, free air or adenopathy.  Urinary bladder decompressed, grossly unremarkable.  No acute bony  abnormality.  IMPRESSION: No acute findings in the abdomen or pelvis on this unenhanced study.   Original Report Authenticated By: Charlett Nose, M.D.    Dg Abd Acute W/chest  04/30/2012  *RADIOLOGY REPORT*  Clinical Data: Chest and abdominal pain  ACUTE ABDOMEN SERIES (ABDOMEN 2 VIEW & CHEST 1 VIEW)  Comparison: CT abdomen/pelvis 04/29/2012; prior chest x-ray 02/15/2012  Findings: The lungs are clear.  Cardiac and mediastinal contours are within normal limits.  Surgical changes of prior lap band procedure.  The lap band is appropriately positioned in the region of the GE junction as angled of 45 degrees.  The subcutaneous tubing and ports appear intact. The bowel gas pattern is unremarkable nonobstructed.  Gas and stool noted throughout the colon to the level of the rectum.  No free air or dilated loops of bowel.  Surgical clips the right upper quadrant suggest prior cholecystectomy.  No organomegaly.  No acute osseous abnormality.  IMPRESSION:  1.  No acute cardiopulmonary disease. 2.  Unremarkable bowel gas pattern without evidence of  obstruction. 3.  Surgical changes of prior lap band procedure without evidence of complication by conventional radiography.   Original Report Authenticated By: Malachy Moan, M.D.     Assessment/Plan Principal Problem:   Abdominal pain Active Problems:   ESRD (end stage renal disease) on dialysis   Hypertension   Nausea and vomiting   1. Abdominal pain and nausea and vomiting in a patient with lap band - at this time I have Patient n.p.o. Consult surgery for their recommendations. Pain relief medications. 2. ESRD on hemodialysis Monday Wednesday and Friday - presently not short of breath has had dialysis today. Consult nephrology for dialysis. 3. Hypertension -  when necessary IV hydralazine for systolic blood pressure more than 160.  Surgery has been consulted. if consultant consulted, please document name and whether formally or informally consulted  Code Status: Full code.  Family Communication: None at the bedside.  Disposition Plan: Admit to inpatient.   Latrenda Irani N. [facility] Pager [phone].  If 7PM-7AM, please contact night-coverage [email or web address] Password [facility] 04/30/2012, 8:38 PM

## 2012-04-30 NOTE — Progress Notes (Signed)
Pt transferred from the ED, admitted to Rm [id number]. Pt comes from home with son. He is alert and oriented with complaints of LUQ pain, MD aware. Ambulatory x1 assist. No skin breakdown noted. Oriented to room, instructed to call for assistance before getting out of bed. Resting comfortably at this time, will continue to monitor

## 2012-04-30 NOTE — ED Notes (Signed)
IV team paged.  

## 2012-05-01 DIAGNOSIS — R112 Nausea with vomiting, unspecified: Secondary | ICD-10-CM

## 2012-05-01 LAB — COMPREHENSIVE METABOLIC PANEL
Alkaline Phosphatase: 106 U/L (ref 39–117)
BUN: 24 mg/dL — ABNORMAL HIGH (ref 6–23)
Calcium: 9.3 mg/dL (ref 8.4–10.5)
Creatinine, Ser: 9.07 mg/dL — ABNORMAL HIGH (ref 0.50–1.35)
GFR calc Af Amer: 7 mL/min — ABNORMAL LOW (ref >90)
Glucose, Bld: 126 mg/dL — ABNORMAL HIGH (ref 70–99)
Potassium: 4.1 mEq/L (ref 3.5–5.1)
Total Protein: 8.3 g/dL (ref 6.0–8.3)

## 2012-05-01 LAB — GLUCOSE, CAPILLARY
Glucose-Capillary: 113 mg/dL — ABNORMAL HIGH (ref 70–99)
Glucose-Capillary: 104 mg/dL — ABNORMAL HIGH (ref 70–99)
Glucose-Capillary: 105 mg/dL — ABNORMAL HIGH (ref 70–99)
Glucose-Capillary: 87 mg/dL (ref 70–99)

## 2012-05-01 LAB — CBC WITH DIFFERENTIAL/PLATELET
Basophils Absolute: 0 10*3/uL (ref 0.0–0.1)
Eosinophils Relative: 0 % (ref 0–5)
HCT: 40.3 % (ref 39.0–52.0)
Lymphocytes Relative: 15 % (ref 12–46)
Monocytes Relative: 9 % (ref 3–12)
Neutro Abs: 7.5 10*3/uL (ref 1.7–7.7)
RBC: 4.26 MIL/uL (ref 4.22–5.81)
RDW: 14.3 % (ref 11.5–15.5)
WBC: 9.9 10*3/uL (ref 4.0–10.5)

## 2012-05-01 MED ORDER — METOCLOPRAMIDE HCL 10 MG PO TABS
10.0000 mg | ORAL_TABLET | Freq: Three times a day (TID) | ORAL | Status: DC
Start: 2012-05-01 — End: 2012-05-02
  Administered 2012-05-01 – 2012-05-02 (×3): 10 mg via ORAL
  Filled 2012-05-01 (×7): qty 1

## 2012-05-01 MED ORDER — CALCIUM ACETATE 667 MG PO CAPS
2001.0000 mg | ORAL_CAPSULE | Freq: Three times a day (TID) | ORAL | Status: DC
  Administered 2012-05-01 – 2012-05-02 (×2): 2001 mg via ORAL
  Filled 2012-05-01 (×5): qty 3

## 2012-05-01 MED ORDER — GABAPENTIN 100 MG PO CAPS
100.0000 mg | ORAL_CAPSULE | Freq: Every day | ORAL | Status: DC
  Administered 2012-05-01: 100 mg via ORAL
  Filled 2012-05-01 (×2): qty 1

## 2012-05-01 MED ORDER — PANTOPRAZOLE SODIUM 40 MG PO TBEC
40.0000 mg | DELAYED_RELEASE_TABLET | Freq: Every day | ORAL | Status: DC
  Administered 2012-05-01 – 2012-05-02 (×2): 40 mg via ORAL
  Filled 2012-05-01 (×2): qty 1

## 2012-05-01 MED ORDER — EZETIMIBE 10 MG PO TABS
10.0000 mg | ORAL_TABLET | Freq: Every day | ORAL | Status: DC
  Administered 2012-05-01: 10 mg via ORAL
  Filled 2012-05-01 (×2): qty 1

## 2012-05-01 MED ORDER — DOXERCALCIFEROL 4 MCG/2ML IV SOLN
4.0000 ug | INTRAVENOUS | Status: DC
  Filled 2012-05-01: qty 2

## 2012-05-01 MED ORDER — DICYCLOMINE HCL 20 MG PO TABS
20.0000 mg | ORAL_TABLET | Freq: Two times a day (BID) | ORAL | Status: DC
  Administered 2012-05-01 – 2012-05-02 (×2): 20 mg via ORAL
  Filled 2012-05-01 (×3): qty 1

## 2012-05-01 NOTE — Consult Note (Signed)
[facility] Renal Consultation Note    Indication for Consultation:  Management of ESRD/hemodialysis; anemia, hypertension/volume and secondary hyperparathyroidism  HPI: Randall [name] is a 47 y.o. male with ESRD due to HTN and DM on MWF at [facility] almost 2 years who had a lap band procedure for obesity in September 2011. He has had several episodes of N and V directly attributed to lap band, but has had other episodes of N and V thought not to be related (last was November 2013).  He reports the onset on nausea shortly after dialysis yesterday.  He said he had a BP drop at the end. (this also happened Monday).  Yesterday however, he didn't have much fluid on.  Oddly, his post HD weight Monday was 121 kg and his pre HD weight Wed was 120 Kg only 0.5 above his EDW.  His BP was noted to be lower than ususal.  Usually it is in the 130s and yesterday 100-110. He was afebrile.  After he got home from dialysis he had the onset of continuous dry heaves with abdominal pain. He called his PCP, Dr. Renae Gloss who advised him to go to the ED.  Nausea and vomiting have resolved.  Abdominal pain is 3-4/10. He denies CP, diarrhea or constipation, fever chills, SOB or weakness. He admits to multiple stressors in his life  Past Medical History  Diagnosis Date  . ESRD on hemodialysis     [place] [facility] on MWF, ESRD due to DM/HTN  . Recurrent vomiting   . Hyperlipidemia   . Thyroid disorder     takes sensipar  . Diabetes mellitus     controlling by weight/diet  . 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 GERD (gastroesophageal reflux disease)   . Hypertension   . Hyperthyroidism    Past Surgical History  Procedure Laterality Date  . Gastric restriction surgery  12/06/09    lap band  . Dialysis fistula creation  2011  . Insertion of dialysis catheter  11/13/2011    Procedure: INSERTION OF DIALYSIS CATHETER;  Surgeon: Sherren Kerns, MD;  Location: [facility] OR;  Service: Vascular;  Laterality: Right;   Insertion of 23cm dialysis catheter in right IJ  . Av fistula placement  12/05/2011    Procedure: ARTERIOVENOUS (AV) FISTULA CREATION;  Surgeon: Larina Earthly, MD;  Location: [facility] OR;  Service: Vascular;  Laterality: Left;  . Av fistula placement  01-23-2012    #1 ligation of 2 competing branches of left upper arm AVF   #2  superifical mobilization of left upper arm AVF    Family History  Problem Relation Age of Onset  . Hypertension Mother   . Diabetes Mother   . Hypertension Father   . Diabetes Father    Social History: In the process of getting divorced. Has son who is a Arts administrator at [employer] and daughter in college.  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  He is very focused on getting a transplant soon from [place] and then wants to relocate back to [place].  reports that he has never smoked. He has never used smokeless tobacco. He reports that he does not drink alcohol or use illicit drugs. No Known Allergies Prior to Admission medications   Medication Sig Start Date End Date Taking? Authorizing Provider  calcium acetate (PHOSLO) 667 MG capsule Take 1,334-2,001 mg by mouth 3 (three) times daily with meals. Take 3 capsules with each of  3 daily meals, and 2 capsules with each of 2 daily snacks.   Yes Historical Provider, MD  dicyclomine (BENTYL) 20 MG tablet Take 1 tablet (20 mg total) by mouth 2 (two) times daily. 04/29/12  Yes Kaitlyn Szekalski, PA-C  ezetimibe (ZETIA) 10 MG tablet Take 10 mg by mouth at bedtime.    Yes Historical Provider, MD  gabapentin (NEURONTIN) 100 MG capsule Take 100 mg by mouth at bedtime.   Yes Historical Provider, MD  HYDROcodone-acetaminophen (VICODIN) 5-500 MG per tablet Take 1 tablet by mouth every 6 (six) hours as needed for pain. For pain 01/23/12  Yes Marlowe Shores, PA  metoCLOPramide (REGLAN) 10 MG tablet Take 10 mg by mouth 4 (four) times daily -  before meals and at bedtime. 02/20/12  Yes Penny Pia, MD   ondansetron (ZOFRAN ODT) 4 MG disintegrating tablet Take 2 tablets (8 mg total) by mouth every 8 (eight) hours as needed for nausea. 04/29/12  Yes Kaitlyn Szekalski, PA-C  pantoprazole (PROTONIX) 40 MG tablet Take 1 tablet (40 mg total) by mouth daily. 01/17/12  Yes Shanker Levora Dredge, MD   Current Facility-Administered Medications  Medication Dose Route Frequency Provider Last Rate Last Dose  . acetaminophen (TYLENOL) tablet 650 mg  650 mg Oral Q6H PRN Eduard Clos, MD       Or  . acetaminophen (TYLENOL) suppository 650 mg  650 mg Rectal Q6H PRN Eduard Clos, MD      . calcium acetate (PHOSLO) capsule 1,334-2,001 mg  1,334-2,001 mg Oral TID WC Eduard Clos, MD      . heparin injection 5,000 Units  5,000 Units Subcutaneous Q8H Eduard Clos, MD   5,000 Units at 05/01/12 [id number]  . HYDROmorphone (DILAUDID) injection 1 mg  1 mg Intravenous Q3H PRN Eduard Clos, MD   1 mg at 05/01/12 0505  . ondansetron (ZOFRAN) tablet 4 mg  4 mg Oral Q6H PRN Eduard Clos, MD       Or  . ondansetron [facility]) injection 4 mg  4 mg Intravenous Q6H PRN Eduard Clos, MD      . sodium chloride 0.9 % injection 3 mL  3 mL Intravenous Q12H Eduard Clos, MD   3 mL at 04/30/12 2318   Labs: Basic Metabolic Panel:  Recent Labs Lab 04/29/12 1157 04/30/12 1747 04/30/12 1916 05/01/12 0455  NA 139 135  --  141  K 4.5 5.9* 4.3 4.1  CL 92* 91*  --  95*  CO2 28 27  --  31  GLUCOSE 128* 117*  --  126*  BUN 34* 18  --  24*  CREATININE 10.86* 6.72*  --  9.07*  CALCIUM 9.9 9.3  --  9.3   Liver Function Tests:  Recent Labs Lab 04/29/12 1157 04/30/12 1747 05/01/12 0455  AST 18 64* 26  ALT 24 41 28  ALKPHOS 110 103 106  BILITOT 0.4 0.4 0.5  PROT 9.4* 9.4* 8.3  ALBUMIN 4.3 4.2 4.1    Recent Labs Lab 04/30/12 1747 05/01/12 0455  LIPASE 31 16   CBC:  Recent Labs Lab 04/29/12 1157 04/30/12 1747 05/01/12 0455  WBC 12.6* 10.5 9.9  NEUTROABS 10.6* 7.6 7.5   HGB 13.5 14.2 13.2  HCT 41.1 43.2 40.3  MCV 94.5 95.8 94.6  PLT 194 152 203  CBG:  Recent Labs Lab 04/30/12 2148 05/01/12 0740  GLUCAP 135* 113*   Studies/Results: Ct Abdomen Pelvis Wo Contrast  04/29/2012  *RADIOLOGY REPORT*  Clinical Data: Abdominal pain, vomiting.  CT ABDOMEN AND PELVIS WITHOUT CONTRAST  Technique:  Multidetector CT imaging of the abdomen and pelvis was performed following the standard protocol without intravenous contrast.  Comparison: 02/16/2012  Findings: Lung bases are clear.  No effusions.  Heart is normal size.  Prior gastric band.  Prior cholecystectomy.  Liver, spleen, pancreas, adrenals have an unremarkable unenhanced appearance.  No hydronephrosis within the kidneys.  Kidneys appear mildly atrophic with approximate craniocaudal length of 8 cm bilaterally.  No renal or ureteral stones.  Appendix is visualized and is normal.  Large and small bowel grossly unremarkable.  Aorta is normal caliber.  No free fluid, free air or adenopathy.  Urinary bladder decompressed, grossly unremarkable.  No acute bony abnormality.  IMPRESSION: No acute findings in the abdomen or pelvis on this unenhanced study.   Original Report Authenticated By: Charlett Nose, M.D.    Dg Abd Acute W/chest  04/30/2012  *RADIOLOGY REPORT*  Clinical Data: Chest and abdominal pain  ACUTE ABDOMEN SERIES (ABDOMEN 2 VIEW & CHEST 1 VIEW)  Comparison: CT abdomen/pelvis 04/29/2012; prior chest x-ray 02/15/2012  Findings: The lungs are clear.  Cardiac and mediastinal contours are within normal limits.  Surgical changes of prior lap band procedure.  The lap band is appropriately positioned in the region of the GE junction as angled of 45 degrees.  The subcutaneous tubing and ports appear intact. The bowel gas pattern is unremarkable nonobstructed.  Gas and stool noted throughout the colon to the level of the rectum.  No free air or dilated loops of bowel.  Surgical clips the right upper quadrant suggest prior  cholecystectomy.  No organomegaly.  No acute osseous abnormality.  IMPRESSION:  1.  No acute cardiopulmonary disease. 2.  Unremarkable bowel gas pattern without evidence of obstruction. 3.  Surgical changes of prior lap band procedure without evidence of complication by conventional radiography.   Original Report Authenticated By: Malachy Moan, M.D.    ROS: As per HPI otherwise neg  Physical Exam: Filed Vitals:   04/30/12 2030 04/30/12 2158 05/01/12 0602 05/01/12 0917  BP: 154/88 158/84 170/96 128/70  Pulse: 96 91 95 87  Temp:  99.1 F (37.3 C) 98.9 F (37.2 C) 98.7 F (37.1 C)  TempSrc:  Oral Oral   Resp:  17 17 21   Height:  5\' 11"  (1.803 m)    Weight:  120.1 kg (264 lb 12.4 oz)    SpO2: 95% 100% 100% 94%     General: Well developed, well nourished, in no acute distress (even though he describes pain as 3-4) Head: Normocephalic, atraumatic, sclera non-icteric, mucus membranes are moist Neck: Supple. JVD not elevated. Lungs: Clear bilaterally to auscultation without wheezes, rales, or rhonchi. Breathing is unlabored. Heart: RRR with S1 S2. No murmurs, rubs, or gallops appreciated. Abdomen: Soft, non-tender, non-distended with normoactive bowel sounds. M-S:  Strength and tone appear normal for age. Lower extremities:without edema or ischemic changes, no open wounds  Neuro: Alert and oriented X 3. Moves all extremities spontaneously. Psych:  Responds to questions appropriately with a normal affect. Dialysis Access: left upper AVF with buttonholes. patent  Dialysis Orders: Center: [facility] MWF EDW 119.5 left upper AVF Optiflux 200 2 K 2.25 Ca 450/800 10 K heparin hectorol 4 Epo 1000. No iron Last Hgb 11.1 2/05 Assessment/Plan: 1.  Abdominal pain with N and V - on CL and pain meds; improving; thought not related to the lap band since it has been a month since it was adjusted.  Initially had elevate WBC but has since correct. CT neg 2.  ESRD -  MWF per routine 3.  Hypertension/volume  -  BP higher here - not on meds. Will keep EDW as is, if there is a BP drop would ^ 0.5 4.  Anemia  - Hgb 13.2 - no ESA for now 5.  Metabolic bone disease -  Continue hectorol - can resume binders when off CL 6.  Nutrition - on CL 7. Hx DM - diet controlled  Sheffield Slider, PA-C [facility] Beeper [phone] 05/01/2012, 10:48 AM  Mr. Edmonds is awake and alert.  N and V are "much better."  Surgeons don't think any adjustment in lap band is needed.  He's advancing diet well so far.  Dialysis planned for tomorrow.  Agree with plans as outlined above.

## 2012-05-01 NOTE — Progress Notes (Signed)
Subjective: Passing flatus Abdominal pain less, still nauseated  Objective: Vital signs in last 24 hours: Temp:  [98.1 F (36.7 C)-99.1 F (37.3 C)] 98.9 F (37.2 C) (02/20 0602) Pulse Rate:  [90-104] 95 (02/20 0602) Resp:  [17-18] 17 (02/20 0602) BP: (142-170)/(75-96) 170/96 mmHg (02/20 0602) SpO2:  [95 %-100 %] 100 % (02/20 0602) Weight:  [264 lb 12.4 oz (120.1 kg)] 264 lb 12.4 oz (120.1 kg) (02/19 2158) Last BM Date: 04/29/12  Intake/Output from previous day:   Intake/Output this shift:    Abdomen soft, minimally tender, non distended No peritonitis  Lab Results:   Recent Labs  04/30/12 1747 05/01/12 0455  WBC 10.5 9.9  HGB 14.2 13.2  HCT 43.2 40.3  PLT 152 203   BMET  Recent Labs  04/30/12 1747 04/30/12 1916 05/01/12 0455  NA 135  --  141  K 5.9* 4.3 4.1  CL 91*  --  95*  CO2 27  --  31  GLUCOSE 117*  --  126*  BUN 18  --  24*  CREATININE 6.72*  --  9.07*  CALCIUM 9.3  --  9.3   PT/INR No results found for this basename: LABPROT, INR,  in the last 72 hours ABG No results found for this basename: PHART, PCO2, PO2, HCO3,  in the last 72 hours  Studies/Results: Ct Abdomen Pelvis Wo Contrast  04/29/2012  *RADIOLOGY REPORT*  Clinical Data: Abdominal pain, vomiting.  CT ABDOMEN AND PELVIS WITHOUT CONTRAST  Technique:  Multidetector CT imaging of the abdomen and pelvis was performed following the standard protocol without intravenous contrast.  Comparison: 02/16/2012  Findings: Lung bases are clear.  No effusions.  Heart is normal size.  Prior gastric band.  Prior cholecystectomy.  Liver, spleen, pancreas, adrenals have an unremarkable unenhanced appearance.  No hydronephrosis within the kidneys.  Kidneys appear mildly atrophic with approximate craniocaudal length of 8 cm bilaterally.  No renal or ureteral stones.  Appendix is visualized and is normal.  Large and small bowel grossly unremarkable.  Aorta is normal caliber.  No free fluid, free air or  adenopathy.  Urinary bladder decompressed, grossly unremarkable.  No acute bony abnormality.  IMPRESSION: No acute findings in the abdomen or pelvis on this unenhanced study.   Original Report Authenticated By: Charlett Nose, M.D.    Dg Abd Acute W/chest  04/30/2012  *RADIOLOGY REPORT*  Clinical Data: Chest and abdominal pain  ACUTE ABDOMEN SERIES (ABDOMEN 2 VIEW & CHEST 1 VIEW)  Comparison: CT abdomen/pelvis 04/29/2012; prior chest x-ray 02/15/2012  Findings: The lungs are clear.  Cardiac and mediastinal contours are within normal limits.  Surgical changes of prior lap band procedure.  The lap band is appropriately positioned in the region of the GE junction as angled of 45 degrees.  The subcutaneous tubing and ports appear intact. The bowel gas pattern is unremarkable nonobstructed.  Gas and stool noted throughout the colon to the level of the rectum.  No free air or dilated loops of bowel.  Surgical clips the right upper quadrant suggest prior cholecystectomy.  No organomegaly.  No acute osseous abnormality.  IMPRESSION:  1.  No acute cardiopulmonary disease. 2.  Unremarkable bowel gas pattern without evidence of obstruction. 3.  Surgical changes of prior lap band procedure without evidence of complication by conventional radiography.   Original Report Authenticated By: Malachy Moan, M.D.     Anti-infectives: Anti-infectives   None      Assessment/Plan: s/p * No surgery found *  Abdominal pain,  nausea, vomiting  I tried to remove fluid from the lap band without success.  I discussed with Dr. Daphine Deutscher who does not feel that the band is the source of his nausea and vomiting especially that the symptoms appeared several weeks after the port was filled.  He does not want the fluid removed.  Will continue to monitor, workup other causes of the N/V.  CT is negative for obstruction.  LOS: 1 day    Randall [name] 05/01/2012

## 2012-05-01 NOTE — Progress Notes (Signed)
[facility] PROGRESS NOTE  Randall [name] ZOX:[id number] DOB: March 19, 1965 DOA: 04/30/2012 PCP: [name]., MD  Brief narrative 47 year old male with history of ESRD on HD, Lapband surgery which was recently refilled on 03/31/12,intermittent nausea and vomiting episodes, diet controlled DM, HTN, GERD was admitted on 05/01/12 with complaints of subacute onset of LUQ pain, nausea and vomiting. Recent CT abdomen and pelvis without contrast was unremarkable. Acute abdominal series unremarkable. Surgery was consulted and patient was admitted for further evaluation and management.   Assessment/Plan: 1. Abdominal pain, nausea and vomiting:? Acute gastroenteritis. CT abdomen negative. Surgery consultation appreciated-thought not related to LAP-BAND. Improving. Start clear liquids and advance diet as tolerated. 2. Hypertension: Better controlled than earlier today. Monitor. 3. ESRD: MWF dialysis. Nephrology consulted. For HD on 2/21. 4. Anemia: Resolved. 5. History of diet-controlled DM. 6. Metabolic bone disease. 7. Hyperkalemia: Resolved.  Code Status: Full Family Communication: discussed with patient Disposition Plan: possible discharge home after HD on 2/21   Consultants:  General surgery  Nephrology  Procedures:  None  Antibiotics:  None   HPI/Subjective: Abdominal pain has improved-LUQ intermittent 4/10 in intensity. No further nausea or vomiting. Willing to try clear liquids.  Objective: Filed Vitals:   04/30/12 2158 05/01/12 0602 05/01/12 0917 05/01/12 1328  BP: 158/84 170/96 128/70 122/71  Pulse: 91 95 87 93  Temp: 99.1 F (37.3 C) 98.9 F (37.2 C) 98.7 F (37.1 C) 98.6 F (37 C)  TempSrc: Oral Oral    Resp: 17 17 21 20   Height: 5\' 11"  (1.803 m)     Weight: 120.1 kg (264 lb 12.4 oz)     SpO2: 100% 100% 94% 95%    Intake/Output Summary (Last 24 hours) at 05/01/12 1535 Last data filed at 05/01/12 1329  Gross per 24 hour  Intake    480 ml  Output       0 ml  Net    480 ml   Filed Weights   04/30/12 2158  Weight: 120.1 kg (264 lb 12.4 oz)    Exam:   General exam: Comfortable.  Respiratory system: Clear. No increased work of breathing.  Cardiovascular system: S1 & S2 heard, RRR. No JVD, murmurs, gallops, clicks or pedal edema.  Gastrointestinal system: Abdomen is nondistended, soft and nontender. Normal bowel sounds heard.  Central nervous system: Alert and oriented. No focal neurological deficits.  Extremities: Symmetric 5 x 5 power.   Data Reviewed: Basic Metabolic Panel:  Recent Labs Lab 04/29/12 1157 04/30/12 1747 04/30/12 1916 05/01/12 0455  NA 139 135  --  141  K 4.5 5.9* 4.3 4.1  CL 92* 91*  --  95*  CO2 28 27  --  31  GLUCOSE 128* 117*  --  126*  BUN 34* 18  --  24*  CREATININE 10.86* 6.72*  --  9.07*  CALCIUM 9.9 9.3  --  9.3   Liver Function Tests:  Recent Labs Lab 04/29/12 1157 04/30/12 1747 05/01/12 0455  AST 18 64* 26  ALT 24 41 28  ALKPHOS 110 103 106  BILITOT 0.4 0.4 0.5  PROT 9.4* 9.4* 8.3  ALBUMIN 4.3 4.2 4.1    Recent Labs Lab 04/30/12 1747 05/01/12 0455  LIPASE 31 16   No results found for this basename: AMMONIA,  in the last 168 hours CBC:  Recent Labs Lab 04/29/12 1157 04/30/12 1747 05/01/12 0455  WBC 12.6* 10.5 9.9  NEUTROABS 10.6* 7.6 7.5  HGB 13.5 14.2 13.2  HCT 41.1 43.2 40.3  MCV  94.5 95.8 94.6  PLT 194 152 203   Cardiac Enzymes: No results found for this basename: CKTOTAL, CKMB, CKMBINDEX, TROPONINI,  in the last 168 hours BNP (last 3 results) No results found for this basename: PROBNP,  in the last 8760 hours CBG:  Recent Labs Lab 04/30/12 2148 05/01/12 0740 05/01/12 1141  GLUCAP 135* 113* 104*    No results found for this or any previous visit (from the past 240 hour(s)).   Studies: Ct Abdomen Pelvis Wo Contrast  04/29/2012  *RADIOLOGY REPORT*  Clinical Data: Abdominal pain, vomiting.  CT ABDOMEN AND PELVIS WITHOUT CONTRAST  Technique:   Multidetector CT imaging of the abdomen and pelvis was performed following the standard protocol without intravenous contrast.  Comparison: 02/16/2012  Findings: Lung bases are clear.  No effusions.  Heart is normal size.  Prior gastric band.  Prior cholecystectomy.  Liver, spleen, pancreas, adrenals have an unremarkable unenhanced appearance.  No hydronephrosis within the kidneys.  Kidneys appear mildly atrophic with approximate craniocaudal length of 8 cm bilaterally.  No renal or ureteral stones.  Appendix is visualized and is normal.  Large and small bowel grossly unremarkable.  Aorta is normal caliber.  No free fluid, free air or adenopathy.  Urinary bladder decompressed, grossly unremarkable.  No acute bony abnormality.  IMPRESSION: No acute findings in the abdomen or pelvis on this unenhanced study.   Original Report Authenticated By: Charlett Nose, M.D.    Dg Abd Acute W/chest  04/30/2012  *RADIOLOGY REPORT*  Clinical Data: Chest and abdominal pain  ACUTE ABDOMEN SERIES (ABDOMEN 2 VIEW & CHEST 1 VIEW)  Comparison: CT abdomen/pelvis 04/29/2012; prior chest x-ray 02/15/2012  Findings: The lungs are clear.  Cardiac and mediastinal contours are within normal limits.  Surgical changes of prior lap band procedure.  The lap band is appropriately positioned in the region of the GE junction as angled of 45 degrees.  The subcutaneous tubing and ports appear intact. The bowel gas pattern is unremarkable nonobstructed.  Gas and stool noted throughout the colon to the level of the rectum.  No free air or dilated loops of bowel.  Surgical clips the right upper quadrant suggest prior cholecystectomy.  No organomegaly.  No acute osseous abnormality.  IMPRESSION:  1.  No acute cardiopulmonary disease. 2.  Unremarkable bowel gas pattern without evidence of obstruction. 3.  Surgical changes of prior lap band procedure without evidence of complication by conventional radiography.   Original Report Authenticated By: Malachy Moan, M.D.      Additional labs:   Scheduled Meds: . calcium acetate  2,001 mg Oral TID WC  . [START ON 05/02/2012] doxercalciferol  4 mcg Intravenous Q M,W,F-HD  . heparin  5,000 Units Subcutaneous Q8H  . sodium chloride  3 mL Intravenous Q12H   Continuous Infusions:   Principal Problem:   Abdominal pain Active Problems:   ESRD (end stage renal disease) on dialysis   Hypertension   Nausea and vomiting    Time spent: 30 minutes.    [facility]  [facility] Pager [phone].   If 8PM-8AM, please contact night-coverage at [email or web address], password [facility] 05/01/2012, 3:35 PM  LOS: 1 day

## 2012-05-01 NOTE — Progress Notes (Signed)
Utilization Review Completed.   Adelaido Nicklaus, RN, BSN Nurse Case Manager  [phone]  

## 2012-05-01 NOTE — ED Provider Notes (Signed)
History     CSN: [id number]  Arrival date & time 04/30/12  1653   First MD Initiated Contact with Patient 04/30/12 1703      Chief Complaint  Patient presents with  . Chest Pain  . Abdominal Pain  . Shortness of Breath    (Consider location/radiation/quality/duration/timing/severity/associated sxs/prior treatment) Patient is a 47 y.o. male presenting with chest pain, abdominal pain, and shortness of breath. The history is provided by the patient.  Chest Pain Associated symptoms: abdominal pain, nausea, shortness of breath and vomiting   Associated symptoms: no back pain, no headache, no numbness and no weakness   Abdominal Pain Associated symptoms: chest pain, nausea, shortness of breath and vomiting   Associated symptoms: no diarrhea   Shortness of Breath Associated symptoms: abdominal pain, chest pain and vomiting   Associated symptoms: no headaches and no rash    patient presents with persistent nausea vomiting abdominal pain. This has been going on for the last couple days. He seen in the ER for the same the day prior and was discharged home. He had dialysis today and then had a return  Heof the symptoms.. No chest pain. To abdominal pain. He has a previous history of the same. He's been unable to tolerate orals home.   Past Medical History  Diagnosis Date  . ESRD on hemodialysis     [place] [facility] on MWF, ESRD due to DM/HTN  . Recurrent vomiting   . Hyperlipidemia   . Thyroid disorder     takes sensipar  . Diabetes mellitus     controlling by weight/diet  . 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 GERD (gastroesophageal reflux disease)   . Hypertension   . Hyperthyroidism     Past Surgical History  Procedure Laterality Date  . Gastric restriction surgery  12/06/09    lap band  . Dialysis fistula creation  2011  . Insertion of dialysis catheter  11/13/2011    Procedure: INSERTION OF DIALYSIS CATHETER;  Surgeon: Sherren Kerns, MD;  Location: [facility] OR;  Service:  Vascular;  Laterality: Right;  Insertion of 23cm dialysis catheter in right IJ  . Av fistula placement  12/05/2011    Procedure: ARTERIOVENOUS (AV) FISTULA CREATION;  Surgeon: Larina Earthly, MD;  Location: [facility] OR;  Service: Vascular;  Laterality: Left;  . Av fistula placement  01-23-2012    #1 ligation of 2 competing branches of left upper arm AVF   #2  superifical mobilization of left upper arm AVF     Family History  Problem Relation Age of Onset  . Hypertension Mother   . Diabetes Mother   . Hypertension Father   . Diabetes Father     History  Substance Use Topics  . Smoking status: Never Smoker   . Smokeless tobacco: Never Used  . Alcohol Use: No      Review of Systems  Constitutional: Negative for activity change and appetite change.  HENT: Negative for neck stiffness.   Eyes: Negative for pain.  Respiratory: Positive for shortness of breath. Negative for chest tightness.   Cardiovascular: Positive for chest pain. Negative for leg swelling.  Gastrointestinal: Positive for nausea, vomiting and abdominal pain. Negative for diarrhea.  Genitourinary: Negative for flank pain.  Musculoskeletal: Negative for back pain.  Skin: Negative for rash.  Neurological: Negative for weakness, numbness and headaches.  Psychiatric/Behavioral: Negative for behavioral problems.    Allergies  Review of patient's allergies indicates no known allergies.  Home Medications  No current outpatient prescriptions on file.  BP 118/74  Pulse 85  Temp(Src) 98.8 F (37.1 C) (Oral)  Resp 17  Ht 5\' 11"  (1.803 m)  Wt 264 lb 12.4 oz (120.1 kg)  BMI 36.94 kg/m2  SpO2 98%  Physical Exam  Nursing note and vitals reviewed. Constitutional: He is oriented to person, place, and time. He appears well-developed and well-nourished.  HENT:  Head: Normocephalic and atraumatic.  Eyes: EOM are normal. Pupils are equal, round, and reactive to light.  Neck: Normal range of motion. Neck supple.   Cardiovascular: Normal rate, regular rhythm and normal heart sounds.   No murmur heard. Pulmonary/Chest: Effort normal and breath sounds normal.  Abdominal: Soft. Bowel sounds are normal. He exhibits no distension and no mass. There is tenderness. There is no rebound and no guarding.  Mild upper abdominal tenderness. No rebound or guarding.  Musculoskeletal: Normal range of motion. He exhibits no edema.  Neurological: He is alert and oriented to person, place, and time. No cranial nerve deficit.  Skin: Skin is warm and dry.  Psychiatric: He has a normal mood and affect.    ED Course  Procedures (including critical care time)  Labs Reviewed  COMPREHENSIVE METABOLIC PANEL - Abnormal; Notable for the following:    Potassium 5.9 (*)    Chloride 91 (*)    Glucose, Bld 117 (*)    Creatinine, Ser 6.72 (*)    Total Protein 9.4 (*)    AST 64 (*)    GFR calc non Af Amer 9 (*)    GFR calc Af Amer 10 (*)    All other components within normal limits  GLUCOSE, CAPILLARY - Abnormal; Notable for the following:    Glucose-Capillary 135 (*)    All other components within normal limits  COMPREHENSIVE METABOLIC PANEL - Abnormal; Notable for the following:    Chloride 95 (*)    Glucose, Bld 126 (*)    BUN 24 (*)    Creatinine, Ser 9.07 (*)    GFR calc non Af Amer 6 (*)    GFR calc Af Amer 7 (*)    All other components within normal limits  GLUCOSE, CAPILLARY - Abnormal; Notable for the following:    Glucose-Capillary 113 (*)    All other components within normal limits  GLUCOSE, CAPILLARY - Abnormal; Notable for the following:    Glucose-Capillary 104 (*)    All other components within normal limits  GLUCOSE, CAPILLARY - Abnormal; Notable for the following:    Glucose-Capillary 105 (*)    All other components within normal limits  CBC WITH DIFFERENTIAL  LIPASE, BLOOD  POTASSIUM  CBC WITH DIFFERENTIAL  LIPASE, BLOOD  GLUCOSE, CAPILLARY   Dg Abd Acute W/chest  04/30/2012  *RADIOLOGY  REPORT*  Clinical Data: Chest and abdominal pain  ACUTE ABDOMEN SERIES (ABDOMEN 2 VIEW & CHEST 1 VIEW)  Comparison: CT abdomen/pelvis 04/29/2012; prior chest x-ray 02/15/2012  Findings: The lungs are clear.  Cardiac and mediastinal contours are within normal limits.  Surgical changes of prior lap band procedure.  The lap band is appropriately positioned in the region of the GE junction as angled of 45 degrees.  The subcutaneous tubing and ports appear intact. The bowel gas pattern is unremarkable nonobstructed.  Gas and stool noted throughout the colon to the level of the rectum.  No free air or dilated loops of bowel.  Surgical clips the right upper quadrant suggest prior cholecystectomy.  No organomegaly.  No acute osseous abnormality.  IMPRESSION:  1.  No acute cardiopulmonary disease. 2.  Unremarkable bowel gas pattern without evidence of obstruction. 3.  Surgical changes of prior lap band procedure without evidence of complication by conventional radiography.   Original Report Authenticated By: Malachy Moan, M.D.      1. Intractable nausea and vomiting   2. Abdominal pain   3. ESRD (end stage renal disease) on dialysis   4. History of laparoscopic adjustable gastric banding   5. Nausea and vomiting   6. Hypertension     Date: 05/01/2012  [id number]  Rhythm: sinus tachycardia  QRS Axis: normal  Intervals: normal  ST/T Wave abnormalities: normal  Conduction Disutrbances:none  Narrative Interpretation: increased HR  Old EKG Reviewed: changes noted     MDM   patient with intractable nausea vomiting. Also upper abdominal pain. He was dialyzed earlier today. He will be admitted.        Juliet Rude. Rubin Payor, MD 05/01/12 [phone]

## 2012-05-02 LAB — GLUCOSE, CAPILLARY: Glucose-Capillary: 99 mg/dL (ref 70–99)

## 2012-05-02 LAB — CBC
HCT: 34.7 % — ABNORMAL LOW (ref 39.0–52.0)
Hemoglobin: 11.6 g/dL — ABNORMAL LOW (ref 13.0–17.0)
MCH: 31.3 pg (ref 26.0–34.0)
MCHC: 33.4 g/dL (ref 30.0–36.0)
MCV: 93.5 fL (ref 78.0–100.0)
Platelets: 164 10*3/uL (ref 150–400)
RBC: 3.71 MIL/uL — ABNORMAL LOW (ref 4.22–5.81)
RDW: 14 % (ref 11.5–15.5)
WBC: 8.8 10*3/uL (ref 4.0–10.5)

## 2012-05-02 LAB — RENAL FUNCTION PANEL
Albumin: 3.5 g/dL (ref 3.5–5.2)
BUN: 37 mg/dL — ABNORMAL HIGH (ref 6–23)
CO2: 29 mEq/L (ref 19–32)
Calcium: 8.8 mg/dL (ref 8.4–10.5)
Chloride: 93 mEq/L — ABNORMAL LOW (ref 96–112)
Creatinine, Ser: 12.17 mg/dL — ABNORMAL HIGH (ref 0.50–1.35)
GFR calc Af Amer: 5 mL/min — ABNORMAL LOW (ref >90)
GFR calc non Af Amer: 4 mL/min — ABNORMAL LOW (ref >90)
Glucose, Bld: 90 mg/dL (ref 70–99)
Phosphorus: 6.7 mg/dL — ABNORMAL HIGH (ref 2.3–4.6)
Potassium: 3.9 mEq/L (ref 3.5–5.1)
Sodium: 136 mEq/L (ref 135–145)

## 2012-05-02 LAB — HEPATITIS B SURFACE ANTIGEN: Hepatitis B Surface Ag: NEGATIVE

## 2012-05-02 MED ORDER — LIDOCAINE HCL (PF) 1 % IJ SOLN: 5.0000 mL | INTRAMUSCULAR | Status: DC | PRN

## 2012-05-02 MED ORDER — PENTAFLUOROPROP-TETRAFLUOROETH EX AERO: 1.0000 "application " | INHALATION_SPRAY | CUTANEOUS | Status: DC | PRN

## 2012-05-02 MED ORDER — HEPARIN SODIUM (PORCINE) 1000 UNIT/ML DIALYSIS: 20.0000 [IU]/kg | INTRAMUSCULAR | Status: DC | PRN

## 2012-05-02 MED ORDER — SODIUM CHLORIDE 0.9 % IV SOLN: 100.0000 mL | INTRAVENOUS | Status: DC | PRN

## 2012-05-02 MED ORDER — NEPRO/CARBSTEADY PO LIQD: 237.0000 mL | ORAL | Status: DC | PRN

## 2012-05-02 MED ORDER — LIDOCAINE-PRILOCAINE 2.5-2.5 % EX CREA: 1.0000 "application " | TOPICAL_CREAM | CUTANEOUS | Status: DC | PRN

## 2012-05-02 MED ORDER — HEPARIN SODIUM (PORCINE) 1000 UNIT/ML DIALYSIS: 1000.0000 [IU] | INTRAMUSCULAR | Status: DC | PRN

## 2012-05-02 MED ORDER — DOXERCALCIFEROL 4 MCG/2ML IV SOLN
INTRAVENOUS | Status: AC
  Administered 2012-05-02: 4 ug via INTRAVENOUS
  Filled 2012-05-02: qty 2

## 2012-05-02 MED ORDER — ALTEPLASE 2 MG IJ SOLR
2.0000 mg | Freq: Once | INTRAMUSCULAR | Status: DC | PRN
  Filled 2012-05-02: qty 2

## 2012-05-02 NOTE — Progress Notes (Signed)
Patient ID: Randall [name], male   DOB: 1965-10-17, 47 y.o.   MRN: [id number]   LOS: 2 days   Subjective: Feels much better today. N/V has resolved. Still has faint abd pain in LUQ but much better.  Objective: Vital signs in last 24 hours: Temp:  [98.1 F (36.7 C)-98.8 F (37.1 C)] 98.1 F (36.7 C) (02/21 0639) Pulse Rate:  [76-93] 76 (02/21 0900) Resp:  [17-20] 18 (02/21 0800) BP: (111-133)/(69-82) 125/69 mmHg (02/21 0900) SpO2:  [95 %-98 %] 96 % (02/21 0639) Weight:  [254 lb 10.1 oz (115.5 kg)] 254 lb 10.1 oz (115.5 kg) (02/21 0639) Last BM Date: 05/01/12   Laboratory  CBC  Recent Labs  05/01/12 0455 05/02/12 0703  WBC 9.9 8.8  HGB 13.2 11.6*  HCT 40.3 34.7*  PLT 203 164   BMET  Recent Labs  05/01/12 0455 05/02/12 0703  NA 141 136  K 4.1 3.9  CL 95* 93*  CO2 31 29  GLUCOSE 126* 90  BUN 24* 37*  CREATININE 9.07* 12.17*  CALCIUM 9.3 8.8     Physical Exam General appearance: alert and no distress Resp: clear to auscultation bilaterally Cardio: regular rate and rhythm GI: normal findings: bowel sounds normal and soft, non-tender   Assessment/Plan: Abd pain w/N/V -- Improved greatly. Patient says he's been told if he tolerates lunch he'll be discharged. No obvious etiology for c/o.   Surgery will sign off. Please call with questions.   Freeman Caldron, PA-C Pager: [phone]   05/02/2012

## 2012-05-02 NOTE — Discharge Summary (Signed)
Physician Discharge Summary  Randall [name] ZOX:[id number] DOB: [birth year] DOA: 04/30/2012  PCP: [name]., MD  Admit date: 04/30/2012 Discharge date: 05/02/2012  Time spent: Less than 30 minutes  Recommendations for Outpatient Follow-up:  1. With Dr. Andi Devon, PCP. 2. Hemodialysis Center: Keep up her regular hemodialysis appointments on Mondays/Wednesdays/Fridays.   Discharge Diagnoses:  Principal Problem:   Abdominal pain Active Problems:   ESRD (end stage renal disease) on dialysis   Hypertension   Nausea and vomiting   Discharge Condition: Improved & Stable  Diet recommendation: renal diet  Filed Weights   04/30/12 2158 05/02/12 0639 05/02/12 1127  Weight: 120.1 kg (264 lb 12.4 oz) 115.5 kg (254 lb 10.1 oz) 116.5 kg (256 lb 13.4 oz)    History of present illness:  47 year old male with history of ESRD on HD, Lapband surgery which was recently refilled on 03/31/12,intermittent nausea and vomiting episodes, diet controlled DM, HTN, GERD was admitted on 05/01/12 with complaints of subacute onset of LUQ pain, nausea and vomiting. Recent CT abdomen and pelvis without contrast was unremarkable. Acute abdominal series unremarkable. Surgery was consulted and patient was admitted for further evaluation and management.  Hospital Course:  1. Abdominal pain, nausea and vomiting: possibly Acute gastroenteritis. CT abdomen negative. Patient was admitted to the hospital. His bowels were initially rested and then gradually diet was advanced which he has tolerated. He denies any further abdominal pain, nausea or vomiting. Surgery was consulted because of concern that his lap band was contributing to the presentation. Surgeons did not see any surgical needs especially related to the LAP-BAND. 2. Hypertension: controlled off medications. Volume management per nephrology. 3. ESRD: MWF dialysis. Nephrology consulted. He completed dialysis prior to discharge on 2/21. 4. Anemia: ? Lab  variation. No bleeding. OP follow up across HD. 5. History of diet-controlled DM. 6. Metabolic bone disease. 7. Hyperkalemia: Resolved.   Procedures:  None   Consultations:  General surgery  Nephrology  Discharge Exam:  Complaints: Patient denies complaints. No nausea, vomiting or abdominal pain. Tolerated regular consistency diet. Seen at dialysis.  Filed Vitals:   05/02/12 1100 05/02/12 1111 05/02/12 1127 05/02/12 1142  BP: 131/77 131/77  99/65  Pulse: 84 84  87  Temp:   98.2 F (36.8 C) 98.1 F (36.7 C)  TempSrc:   Oral Oral  Resp:   20 20  Height:      Weight:   116.5 kg (256 lb 13.4 oz)   SpO2:  96%  96%     General exam: Comfortable.   Respiratory system: Clear. No increased work of breathing.   Cardiovascular system: S1 & S2 heard, RRR. No JVD, murmurs, gallops, clicks or pedal edema.  Gastrointestinal system: Abdomen is nondistended, soft and nontender. Normal bowel sounds heard.   Central nervous system: Alert and oriented. No focal neurological deficits.   Extremities: Symmetric 5 x 5 power   Discharge Instructions      Discharge Orders   Future Orders Complete By Expires     Call MD for:  persistant nausea and vomiting  As directed     Call MD for:  severe uncontrolled pain  As directed     Discharge instructions  As directed     Comments:      DIET: Renal diet.    Increase activity slowly  As directed         Medication List    TAKE these medications       calcium acetate 667 MG capsule  Commonly known as:  PHOSLO  Take 1,334-2,001 mg by mouth 3 (three) times daily with meals. Take 3 capsules with each of 3 daily meals, and 2 capsules with each of 2 daily snacks.     dicyclomine 20 MG tablet  Commonly known as:  BENTYL  Take 1 tablet (20 mg total) by mouth 2 (two) times daily.     ezetimibe 10 MG tablet  Commonly known as:  ZETIA  Take 10 mg by mouth at bedtime.     gabapentin 100 MG capsule  Commonly known as:  NEURONTIN   Take 100 mg by mouth at bedtime.     HYDROcodone-acetaminophen 5-500 MG per tablet  Commonly known as:  VICODIN  Take 1 tablet by mouth every 6 (six) hours as needed for pain. For pain     metoCLOPramide 10 MG tablet  Commonly known as:  REGLAN  Take 10 mg by mouth 4 (four) times daily -  before meals and at bedtime.     ondansetron 4 MG disintegrating tablet  Commonly known as:  ZOFRAN ODT  Take 2 tablets (8 mg total) by mouth every 8 (eight) hours as needed for nausea.     pantoprazole 40 MG tablet  Commonly known as:  PROTONIX  Take 1 tablet (40 mg total) by mouth daily.       Follow-up Information   Schedule an appointment as soon as possible for a visit with [name]., MD.   Contact information:   [address] [place] [address] [phone]       Follow up with Hemodialysis center. (Keep up regular hemodialysis appointments on Mondays/Wednesdays/Fridays)        The results of significant diagnostics from this hospitalization (including imaging, microbiology, ancillary and laboratory) are listed below for reference.    Significant Diagnostic Studies: Ct Abdomen Pelvis Wo Contrast  04/29/2012  *RADIOLOGY REPORT*  Clinical Data: Abdominal pain, vomiting.  CT ABDOMEN AND PELVIS WITHOUT CONTRAST  Technique:  Multidetector CT imaging of the abdomen and pelvis was performed following the standard protocol without intravenous contrast.  Comparison: 02/16/2012  Findings: Lung bases are clear.  No effusions.  Heart is normal size.  Prior gastric band.  Prior cholecystectomy.  Liver, spleen, pancreas, adrenals have an unremarkable unenhanced appearance.  No hydronephrosis within the kidneys.  Kidneys appear mildly atrophic with approximate craniocaudal length of 8 cm bilaterally.  No renal or ureteral stones.  Appendix is visualized and is normal.  Large and small bowel grossly unremarkable.  Aorta is normal caliber.  No free fluid, free air or adenopathy.   Urinary bladder decompressed, grossly unremarkable.  No acute bony abnormality.  IMPRESSION: No acute findings in the abdomen or pelvis on this unenhanced study.   Original Report Authenticated By: Charlett Nose, M.D.    Dg Abd Acute W/chest  04/30/2012  *RADIOLOGY REPORT*  Clinical Data: Chest and abdominal pain  ACUTE ABDOMEN SERIES (ABDOMEN 2 VIEW & CHEST 1 VIEW)  Comparison: CT abdomen/pelvis 04/29/2012; prior chest x-ray 02/15/2012  Findings: The lungs are clear.  Cardiac and mediastinal contours are within normal limits.  Surgical changes of prior lap band procedure.  The lap band is appropriately positioned in the region of the GE junction as angled of 45 degrees.  The subcutaneous tubing and ports appear intact. The bowel gas pattern is unremarkable nonobstructed.  Gas and stool noted throughout the colon to the level of the rectum.  No free air or dilated loops of bowel.  Surgical  clips the right upper quadrant suggest prior cholecystectomy.  No organomegaly.  No acute osseous abnormality.  IMPRESSION:  1.  No acute cardiopulmonary disease. 2.  Unremarkable bowel gas pattern without evidence of obstruction. 3.  Surgical changes of prior lap band procedure without evidence of complication by conventional radiography.   Original Report Authenticated By: Malachy Moan, M.D.     Microbiology: No results found for this or any previous visit (from the past 240 hour(s)).   Labs: Basic Metabolic Panel:  Recent Labs Lab 04/29/12 1157 04/30/12 1747 04/30/12 1916 05/01/12 0455 05/02/12 0703  NA 139 135  --  141 136  K 4.5 5.9* 4.3 4.1 3.9  CL 92* 91*  --  95* 93*  CO2 28 27  --  31 29  GLUCOSE 128* 117*  --  126* 90  BUN 34* 18  --  24* 37*  CREATININE 10.86* 6.72*  --  9.07* 12.17*  CALCIUM 9.9 9.3  --  9.3 8.8  PHOS  --   --   --   --  6.7*   Liver Function Tests:  Recent Labs Lab 04/29/12 1157 04/30/12 1747 05/01/12 0455 05/02/12 0703  AST 18 64* 26  --   ALT 24 41 28  --    ALKPHOS 110 103 106  --   BILITOT 0.4 0.4 0.5  --   PROT 9.4* 9.4* 8.3  --   ALBUMIN 4.3 4.2 4.1 3.5    Recent Labs Lab 04/30/12 1747 05/01/12 0455  LIPASE 31 16   No results found for this basename: AMMONIA,  in the last 168 hours CBC:  Recent Labs Lab 04/29/12 1157 04/30/12 1747 05/01/12 0455 05/02/12 0703  WBC 12.6* 10.5 9.9 8.8  NEUTROABS 10.6* 7.6 7.5  --   HGB 13.5 14.2 13.2 11.6*  HCT 41.1 43.2 40.3 34.7*  MCV 94.5 95.8 94.6 93.5  PLT 194 152 203 164   Cardiac Enzymes: No results found for this basename: CKTOTAL, CKMB, CKMBINDEX, TROPONINI,  in the last 168 hours BNP: BNP (last 3 results) No results found for this basename: PROBNP,  in the last 8760 hours CBG:  Recent Labs Lab 05/01/12 1640 05/01/12 2018 05/02/12 0010 05/02/12 0406 05/02/12 1138  GLUCAP 105* 87 99 85 84    Additional labs:    Signed:  Ariely Riddell  [facility] 05/02/2012, 2:38 PM

## 2012-05-02 NOTE — Progress Notes (Signed)
I have seen and examined the patient and agree with the assessment and plans.  Johnatan Baskette A. Rain Friedt  MD, FACS  

## 2012-05-02 NOTE — Procedures (Signed)
[facility]  On HD via L UA AVF BP--127/81  Goal--1 L Hgb--11.6   K+--3.9 N & V better. He's hoping to go home today.

## 2012-06-20 ENCOUNTER — Encounter (HOSPITAL_COMMUNITY): Payer: Self-pay | Admitting: *Deleted

## 2012-06-20 ENCOUNTER — Other Ambulatory Visit: Payer: Self-pay

## 2012-06-20 ENCOUNTER — Emergency Department (HOSPITAL_COMMUNITY): Payer: 59

## 2012-06-20 ENCOUNTER — Emergency Department (HOSPITAL_COMMUNITY)
Admission: EM | Admit: 2012-06-20 | Discharge: 2012-06-20 | Disposition: A | Discharge status: Home / Self Care | Payer: 59 | Attending: Emergency Medicine | Admitting: Emergency Medicine

## 2012-06-20 DIAGNOSIS — I120 Hypertensive chronic kidney disease with stage 5 chronic kidney disease or end stage renal disease: Secondary | ICD-10-CM | POA: Insufficient documentation

## 2012-06-20 DIAGNOSIS — K5900 Constipation, unspecified: Secondary | ICD-10-CM | POA: Insufficient documentation

## 2012-06-20 DIAGNOSIS — E1129 Type 2 diabetes mellitus with other diabetic kidney complication: Secondary | ICD-10-CM | POA: Insufficient documentation

## 2012-06-20 DIAGNOSIS — R112 Nausea with vomiting, unspecified: Secondary | ICD-10-CM | POA: Insufficient documentation

## 2012-06-20 DIAGNOSIS — Z79899 Other long term (current) drug therapy: Secondary | ICD-10-CM | POA: Insufficient documentation

## 2012-06-20 DIAGNOSIS — E785 Hyperlipidemia, unspecified: Secondary | ICD-10-CM | POA: Insufficient documentation

## 2012-06-20 DIAGNOSIS — N186 End stage renal disease: Secondary | ICD-10-CM | POA: Insufficient documentation

## 2012-06-20 DIAGNOSIS — K219 Gastro-esophageal reflux disease without esophagitis: Secondary | ICD-10-CM | POA: Insufficient documentation

## 2012-06-20 DIAGNOSIS — Z992 Dependence on renal dialysis: Secondary | ICD-10-CM | POA: Insufficient documentation

## 2012-06-20 DIAGNOSIS — E0590 Thyrotoxicosis, unspecified without thyrotoxic crisis or storm: Secondary | ICD-10-CM | POA: Insufficient documentation

## 2012-06-20 DIAGNOSIS — R109 Unspecified abdominal pain: Secondary | ICD-10-CM | POA: Insufficient documentation

## 2012-06-20 LAB — CBC WITH DIFFERENTIAL/PLATELET
Basophils Absolute: 0 10*3/uL (ref 0.0–0.1)
Basophils Relative: 0 % (ref 0–1)
Eosinophils Absolute: 0 10*3/uL (ref 0.0–0.7)
Hemoglobin: 14.3 g/dL (ref 13.0–17.0)
MCH: 32.1 pg (ref 26.0–34.0)
MCHC: 34.3 g/dL (ref 30.0–36.0)
Monocytes Relative: 4 % (ref 3–12)
Neutro Abs: 10 10*3/uL — ABNORMAL HIGH (ref 1.7–7.7)
Neutrophils Relative %: 87 % — ABNORMAL HIGH (ref 43–77)
RDW: 14.3 % (ref 11.5–15.5)

## 2012-06-20 LAB — COMPREHENSIVE METABOLIC PANEL
AST: 34 U/L (ref 0–37)
Albumin: 4.4 g/dL (ref 3.5–5.2)
BUN: 22 mg/dL (ref 6–23)
Creatinine, Ser: 7.88 mg/dL — ABNORMAL HIGH (ref 0.50–1.35)
Potassium: 4.3 mEq/L (ref 3.5–5.1)
Total Protein: 9.1 g/dL — ABNORMAL HIGH (ref 6.0–8.3)

## 2012-06-20 LAB — LIPASE, BLOOD: Lipase: 23 U/L (ref 11–59)

## 2012-06-20 LAB — TROPONIN I: Troponin I: <0.3 ng/mL (ref <0.30)

## 2012-06-20 MED ORDER — PROMETHAZINE HCL 25 MG RE SUPP: 25.0000 mg | Freq: Four times a day (QID) | RECTAL | Status: DC | PRN

## 2012-06-20 MED ORDER — PROMETHAZINE HCL 25 MG/ML IJ SOLN
25.0000 mg | Freq: Once | INTRAMUSCULAR | Status: AC
  Administered 2012-06-20: 25 mg via INTRAMUSCULAR
  Filled 2012-06-20: qty 1

## 2012-06-20 MED ORDER — HYDROMORPHONE HCL PF 2 MG/ML IJ SOLN
2.0000 mg | Freq: Once | INTRAMUSCULAR | Status: AC
  Administered 2012-06-20: 2 mg via INTRAMUSCULAR
  Filled 2012-06-20: qty 1

## 2012-06-20 MED ORDER — HYDROMORPHONE HCL PF 1 MG/ML IJ SOLN
1.0000 mg | Freq: Once | INTRAMUSCULAR | Status: AC
  Administered 2012-06-20: 1 mg via INTRAMUSCULAR
  Filled 2012-06-20: qty 1

## 2012-06-20 MED ORDER — OXYCODONE-ACETAMINOPHEN 5-325 MG PO TABS: 1.0000 | ORAL_TABLET | ORAL | Status: DC | PRN

## 2012-06-20 MED ORDER — ALBUTEROL SULFATE HFA 108 (90 BASE) MCG/ACT IN AERS
INHALATION_SPRAY | RESPIRATORY_TRACT | Status: AC
  Filled 2012-06-20: qty 6.7

## 2012-06-20 NOTE — ED Notes (Signed)
Pt given water, took a zip and states his abdominal hurts when he swallow down water. He states he can't drink water.

## 2012-06-20 NOTE — ED Notes (Signed)
The pt has had chest pain and abd pain since leaving dialysis  1230 today with sl  Sob.  Dial;ysis graft lt arm

## 2012-06-20 NOTE — ED Provider Notes (Signed)
History     CSN: [id number]  Arrival date & time 06/20/12  1703   First MD Initiated Contact with Patient 06/20/12 1830      Chief Complaint  Patient presents with  . Chest Pain    (Consider location/radiation/quality/duration/timing/severity/associated sxs/prior treatment) HPI Randall [name] is a 47 y.o. male who presents to ED with complaint of abdominal pain. States finished his dialysis and went and ate lunch. States shortly after developed severe abdominal pain, nausea, vomiting. States hx of the same. States uanble to keep anything down. Pain is "cramping." Vomiting clear. No diarrhea. States has not had a bowel movement in 3 days. Denies fever, chills, shortness of breath, dizziness. States last time had to be hospitalized for the same.    Past Medical History  Diagnosis Date  . ESRD on hemodialysis     [place] [facility] on MWF, ESRD due to DM/HTN  . Recurrent vomiting   . Hyperlipidemia   . Thyroid disorder     takes sensipar  . Diabetes mellitus     controlling by weight/diet  . 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 GERD (gastroesophageal reflux disease)   . Hypertension   . Hyperthyroidism     Past Surgical History  Procedure Laterality Date  . Gastric restriction surgery  12/06/09    lap band  . Dialysis fistula creation  2011  . Insertion of dialysis catheter  11/13/2011    Procedure: INSERTION OF DIALYSIS CATHETER;  Surgeon: Sherren Kerns, MD;  Location: [facility] OR;  Service: Vascular;  Laterality: Right;  Insertion of 23cm dialysis catheter in right IJ  . Av fistula placement  12/05/2011    Procedure: ARTERIOVENOUS (AV) FISTULA CREATION;  Surgeon: Larina Earthly, MD;  Location: [facility] OR;  Service: Vascular;  Laterality: Left;  . Av fistula placement  01-23-2012    #1 ligation of 2 competing branches of left upper arm AVF   #2  superifical mobilization of left upper arm AVF     Family History  Problem Relation Age of Onset  . Hypertension Mother   . Diabetes  Mother   . Hypertension Father   . Diabetes Father     History  Substance Use Topics  . Smoking status: Never Smoker   . Smokeless tobacco: Never Used  . Alcohol Use: No      Review of Systems  Constitutional: Negative for fever and chills.  Respiratory: Positive for chest tightness. Negative for cough, shortness of breath and wheezing.   Cardiovascular: Negative for chest pain, palpitations and leg swelling.  Gastrointestinal: Positive for abdominal pain and constipation. Negative for nausea, vomiting and diarrhea.  Genitourinary: Negative for dysuria and flank pain.  Neurological: Negative for dizziness, weakness, numbness and headaches.    Allergies  Review of patient's allergies indicates no known allergies.  Home Medications   Current Outpatient Rx  Name  Route  Sig  Dispense  Refill  . calcium acetate (PHOSLO) 667 MG capsule   Oral   Take 1,334-2,001 mg by mouth 3 (three) times daily with meals. Take 3 capsules with each of 3 daily meals, and 2 capsules with each of 2 daily snacks.         . dicyclomine (BENTYL) 20 MG tablet   Oral   Take 1 tablet (20 mg total) by mouth 2 (two) times daily.   20 tablet   0   . ezetimibe (ZETIA) 10 MG tablet   Oral   Take 10 mg by mouth  at bedtime.          . gabapentin (NEURONTIN) 100 MG capsule   Oral   Take 100 mg by mouth at bedtime.         . metoCLOPramide (REGLAN) 10 MG tablet   Oral   Take 10 mg by mouth 4 (four) times daily -  before meals and at bedtime.         . ondansetron (ZOFRAN ODT) 4 MG disintegrating tablet   Oral   Take 2 tablets (8 mg total) by mouth every 8 (eight) hours as needed for nausea.   20 tablet   0   . pantoprazole (PROTONIX) 40 MG tablet   Oral   Take 1 tablet (40 mg total) by mouth daily.   30 tablet   0   . HYDROcodone-acetaminophen (VICODIN) 5-500 MG per tablet   Oral   Take 1 tablet by mouth every 6 (six) hours as needed for pain. For pain           BP 141/83   Pulse 103  Temp(Src) 98.4 F (36.9 C) (Oral)  Resp 22  SpO2 100%  Physical Exam  Nursing note and vitals reviewed. Constitutional: He is oriented to person, place, and time. He appears well-developed and well-nourished. No distress.  HENT:  Head: Normocephalic.  Eyes: Conjunctivae are normal.  Neck: Neck supple.  Cardiovascular: Normal rate, regular rhythm and normal heart sounds.   Pulmonary/Chest: Effort normal and breath sounds normal. No respiratory distress. He has no wheezes. He has no rales. He exhibits no tenderness.  Abdominal: Soft. Bowel sounds are normal. He exhibits no distension. There is tenderness. There is no rebound and no guarding.  Diffuse tenderness over all quadrants  Musculoskeletal: Normal range of motion. He exhibits no edema.  Neurological: He is alert and oriented to person, place, and time.  Skin: Skin is warm and dry.  Psychiatric: He has a normal mood and affect. His behavior is normal.    ED Course  Procedures (including critical care time)  Results for orders placed during the hospital encounter of 06/20/12  CBC WITH DIFFERENTIAL      Result Value Range   WBC 11.5 (*) 4.0 - 10.5 K/uL   RBC 4.46  4.22 - 5.81 MIL/uL   Hemoglobin 14.3  13.0 - 17.0 g/dL   HCT 11.9  14.7 - 82.9 %   MCV 93.5  78.0 - 100.0 fL   MCH 32.1  26.0 - 34.0 pg   MCHC 34.3  30.0 - 36.0 g/dL   RDW 56.2  13.0 - 86.5 %   Platelets 189  150 - 400 K/uL   Neutrophils Relative 87 (*) 43 - 77 %   Neutro Abs 10.0 (*) 1.7 - 7.7 K/uL   Lymphocytes Relative 9 (*) 12 - 46 %   Lymphs Abs 1.1  0.7 - 4.0 K/uL   Monocytes Relative 4  3 - 12 %   Monocytes Absolute 0.4  0.1 - 1.0 K/uL   Eosinophils Relative 0  0 - 5 %   Eosinophils Absolute 0.0  0.0 - 0.7 K/uL   Basophils Relative 0  0 - 1 %   Basophils Absolute 0.0  0.0 - 0.1 K/uL  COMPREHENSIVE METABOLIC PANEL      Result Value Range   Sodium 137  135 - 145 mEq/L   Potassium 4.3  3.5 - 5.1 mEq/L   Chloride 93 (*) 96 - 112 mEq/L    CO2 27  19 - 32  mEq/L   Glucose, Bld 177 (*) 70 - 99 mg/dL   BUN 22  6 - 23 mg/dL   Creatinine, Ser 7.82 (*) 0.50 - 1.35 mg/dL   Calcium 95.6  8.4 - 21.3 mg/dL   Total Protein 9.1 (*) 6.0 - 8.3 g/dL   Albumin 4.4  3.5 - 5.2 g/dL   AST 34  0 - 37 U/L   ALT 33  0 - 53 U/L   Alkaline Phosphatase 100  39 - 117 U/L   Total Bilirubin 0.5  0.3 - 1.2 mg/dL   GFR calc non Af Amer 7 (*) >90 mL/min   GFR calc Af Amer 8 (*) >90 mL/min  TROPONIN I      Result Value Range   Troponin I <0.30  <0.30 ng/mL  LIPASE, BLOOD      Result Value Range   Lipase 23  11 - 59 U/L   Dg Chest 2 View  06/20/2012  *RADIOLOGY REPORT*  Clinical Data: Chest pain  CHEST - 2 VIEW  Comparison: 04/30/2012.  Findings: Lungs are essentially clear. No pleural effusion or pneumothorax.  Cardiomediastinal silhouette is within normal limits.  Visualized osseous structures are within normal limits.  IMPRESSION: No evidence of acute cardiopulmonary disease.   Original Report Authenticated By: Charline Bills, M.D.    Dg Abd 2 Views  06/20/2012  *RADIOLOGY REPORT*  Clinical Data: Lower abdominal pain, nausea/vomiting  ABDOMEN - 2 VIEW  Comparison: 04/30/2012  Findings: Laparoscopic band in satisfactory position.  Nonobstructive bowel gas pattern.  No evidence of free air under the diaphragm on the upright view.  IMPRESSION: No evidence of small bowel obstruction or free air.  Lap band in satisfactory position.   Original Report Authenticated By: Charline Bills, M.D.       1. Nausea & vomiting   2. Abdominal pain       MDM  PT here with nausea, vomiting, abdominal pain. Pt denies chest pain despite triage note. Pt states vomiting clear emesis. No blood in his emesis. States has had same symptoms before, reports was told he has bad reflux. Takes protonix and zofran at home.  Labs here unremarkable, at his baseline. No signs of hepatitis or pancreatitis. His abdomen is soft, no guarding. No surgical abdomen.  Pt was monitored  in ED. Given dilaudid IM and phenergan IM for nausea and pain. unable to obtain a line. Pt felt better. Pain was down to 3/10, he is able to tolerate PO fluids in ED. Will d/c home with zofran and protonix and additional pain medication percocet and phenergan suppositories. Pt to follow up with pcp.   Filed Vitals:   06/20/12 2233  BP: 132/59  Pulse: 94  Temp: 98 F (36.7 C)  Resp: 22           Samul Mcinroy A Anastasija Anfinson, PA-C 06/21/12 0005

## 2012-06-21 NOTE — ED Provider Notes (Signed)
Medical screening examination/treatment/procedure(s) were performed by non-physician practitioner and as supervising physician I was immediately available for consultation/collaboration.  Carime Dinkel K Linker, MD 06/21/12 0012 

## 2012-07-18 ENCOUNTER — Ambulatory Visit (INDEPENDENT_AMBULATORY_CARE_PROVIDER_SITE_OTHER): Payer: Medicare Other | Admitting: Surgery

## 2012-08-21 ENCOUNTER — Encounter (INDEPENDENT_AMBULATORY_CARE_PROVIDER_SITE_OTHER): Payer: Medicare Other | Admitting: Surgery

## 2012-09-04 ENCOUNTER — Encounter (INDEPENDENT_AMBULATORY_CARE_PROVIDER_SITE_OTHER): Payer: Self-pay | Admitting: Surgery

## 2012-09-04 ENCOUNTER — Emergency Department (HOSPITAL_COMMUNITY)
Admission: EM | Admit: 2012-09-04 | Discharge: 2012-09-04 | Disposition: A | Discharge status: Home / Self Care | Payer: 59 | Attending: Emergency Medicine | Admitting: Emergency Medicine

## 2012-09-04 ENCOUNTER — Encounter (HOSPITAL_COMMUNITY): Payer: Self-pay | Admitting: *Deleted

## 2012-09-04 ENCOUNTER — Emergency Department (HOSPITAL_COMMUNITY): Payer: 59

## 2012-09-04 DIAGNOSIS — Z9884 Bariatric surgery status: Secondary | ICD-10-CM | POA: Insufficient documentation

## 2012-09-04 DIAGNOSIS — Z87440 Personal history of urinary (tract) infections: Secondary | ICD-10-CM | POA: Insufficient documentation

## 2012-09-04 DIAGNOSIS — R0602 Shortness of breath: Secondary | ICD-10-CM | POA: Insufficient documentation

## 2012-09-04 DIAGNOSIS — R112 Nausea with vomiting, unspecified: Secondary | ICD-10-CM | POA: Insufficient documentation

## 2012-09-04 DIAGNOSIS — R109 Unspecified abdominal pain: Secondary | ICD-10-CM | POA: Insufficient documentation

## 2012-09-04 DIAGNOSIS — Z992 Dependence on renal dialysis: Secondary | ICD-10-CM | POA: Insufficient documentation

## 2012-09-04 DIAGNOSIS — R34 Anuria and oliguria: Secondary | ICD-10-CM | POA: Insufficient documentation

## 2012-09-04 DIAGNOSIS — E119 Type 2 diabetes mellitus without complications: Secondary | ICD-10-CM | POA: Insufficient documentation

## 2012-09-04 DIAGNOSIS — G8929 Other chronic pain: Secondary | ICD-10-CM | POA: Insufficient documentation

## 2012-09-04 DIAGNOSIS — R42 Dizziness and giddiness: Secondary | ICD-10-CM | POA: Insufficient documentation

## 2012-09-04 DIAGNOSIS — R5381 Other malaise: Secondary | ICD-10-CM | POA: Insufficient documentation

## 2012-09-04 DIAGNOSIS — Z8639 Personal history of other endocrine, nutritional and metabolic disease: Secondary | ICD-10-CM | POA: Insufficient documentation

## 2012-09-04 DIAGNOSIS — K219 Gastro-esophageal reflux disease without esophagitis: Secondary | ICD-10-CM | POA: Insufficient documentation

## 2012-09-04 DIAGNOSIS — E079 Disorder of thyroid, unspecified: Secondary | ICD-10-CM | POA: Insufficient documentation

## 2012-09-04 DIAGNOSIS — N186 End stage renal disease: Secondary | ICD-10-CM | POA: Insufficient documentation

## 2012-09-04 DIAGNOSIS — I120 Hypertensive chronic kidney disease with stage 5 chronic kidney disease or end stage renal disease: Secondary | ICD-10-CM | POA: Insufficient documentation

## 2012-09-04 DIAGNOSIS — Z79899 Other long term (current) drug therapy: Secondary | ICD-10-CM | POA: Insufficient documentation

## 2012-09-04 DIAGNOSIS — Z862 Personal history of diseases of the blood and blood-forming organs and certain disorders involving the immune mechanism: Secondary | ICD-10-CM | POA: Insufficient documentation

## 2012-09-04 LAB — URINALYSIS, ROUTINE W REFLEX MICROSCOPIC
Nitrite: NEGATIVE
Protein, ur: 100 mg/dL — AB
Urobilinogen, UA: 0.2 mg/dL (ref 0.0–1.0)

## 2012-09-04 LAB — POCT I-STAT TROPONIN I: Troponin i, poc: 0.03 ng/mL (ref 0.00–0.08)

## 2012-09-04 LAB — COMPREHENSIVE METABOLIC PANEL
ALT: 20 U/L (ref 0–53)
AST: 28 U/L (ref 0–37)
Albumin: 3.9 g/dL (ref 3.5–5.2)
Alkaline Phosphatase: 108 U/L (ref 39–117)
CO2: 26 mEq/L (ref 19–32)
Chloride: 95 mEq/L — ABNORMAL LOW (ref 96–112)
GFR calc non Af Amer: 5 mL/min — ABNORMAL LOW (ref >90)
Potassium: 4.8 mEq/L (ref 3.5–5.1)
Total Bilirubin: 0.8 mg/dL (ref 0.3–1.2)

## 2012-09-04 LAB — RAPID URINE DRUG SCREEN, HOSP PERFORMED
Amphetamines: NOT DETECTED
Barbiturates: NOT DETECTED
Tetrahydrocannabinol: NOT DETECTED

## 2012-09-04 LAB — CBC
HCT: 40.8 % (ref 39.0–52.0)
Hemoglobin: 13.4 g/dL (ref 13.0–17.0)
MCH: 31.2 pg (ref 26.0–34.0)
MCHC: 32.8 g/dL (ref 30.0–36.0)
MCV: 95.1 fL (ref 78.0–100.0)
Platelets: 184 10*3/uL (ref 150–400)
RBC: 4.29 MIL/uL (ref 4.22–5.81)
RDW: 15.5 % (ref 11.5–15.5)
WBC: 10.6 10*3/uL — ABNORMAL HIGH (ref 4.0–10.5)

## 2012-09-04 LAB — URINE MICROSCOPIC-ADD ON

## 2012-09-04 MED ORDER — ONDANSETRON 4 MG PO TBDP
4.0000 mg | ORAL_TABLET | Freq: Once | ORAL | Status: AC
  Administered 2012-09-04: 4 mg via ORAL
  Filled 2012-09-04: qty 1

## 2012-09-04 MED ORDER — SODIUM CHLORIDE 0.9 % IV BOLUS (SEPSIS): 1000.0000 mL | Freq: Once | INTRAVENOUS | Status: DC

## 2012-09-04 MED ORDER — ONDANSETRON HCL 4 MG/2ML IJ SOLN: 4.0000 mg | Freq: Once | INTRAMUSCULAR | Status: DC

## 2012-09-04 MED ORDER — HYDROMORPHONE HCL PF 1 MG/ML IJ SOLN
1.0000 mg | Freq: Once | INTRAMUSCULAR | Status: AC
  Administered 2012-09-04: 1 mg via INTRAMUSCULAR
  Filled 2012-09-04: qty 1

## 2012-09-04 MED ORDER — HYDROMORPHONE HCL PF 2 MG/ML IJ SOLN
2.0000 mg | Freq: Once | INTRAMUSCULAR | Status: AC
  Administered 2012-09-04: 2 mg via INTRAMUSCULAR
  Filled 2012-09-04: qty 1

## 2012-09-04 MED ORDER — HYDROMORPHONE HCL PF 1 MG/ML IJ SOLN: 1.0000 mg | Freq: Once | INTRAMUSCULAR | Status: DC

## 2012-09-04 MED ORDER — ONDANSETRON 4 MG PO TBDP: 8.0000 mg | ORAL_TABLET | Freq: Three times a day (TID) | ORAL | Status: DC | PRN

## 2012-09-04 NOTE — ED Notes (Addendum)
Pt reports abdominal pain that started yesterday after dialysis.  States that he had CP and SOB that started this morning.  Pt ambulatory with difficulty-appears very weak.  Pt tachypnea and speaking in broken sentences. Pt reports N/V this morning.

## 2012-09-04 NOTE — ED Notes (Signed)
Pt c/o abd pain that radiates to chest with sob. Pt states pain started last night, describes pain as a dull pain with occasional cramps. Pt states pain began after dialysis. Pt states he has had this pain before and came here to the ED they could not find a cause for his pain.

## 2012-09-04 NOTE — ED Provider Notes (Signed)
History    CSN: [id number] Arrival date & time 09/04/12  1149  First MD Initiated Contact with Patient 09/04/12 1220     Chief Complaint  Patient presents with  . Shortness of Breath   (Consider location/radiation/quality/duration/timing/severity/associated sxs/prior Treatment) The history is provided by the patient and medical records. No language interpreter was used.    Randall [name] 47 year old male with a past medical history of end-stage renal disease, dialyzed yesterday, hypertension, chronic abdominal pain and recurrent vomiting.  Patient states that he began having severe abdominal pain yesterday after dialysis.  He states that he been felt as if it moved up into his chest and he became short of breath.  He states that the pain then went back down into his belly and has remained there since.  He's had multiple episodes of nausea  And vomiting, nonbilious,non-bloody vomitus. Patient has been hospitalized for the same complaint many times.  Workup was negative at that time. denies use of marijuana. Denies diarrhea, constipation.  Patient describes pain as diffuse, intermittently crampy, 10 out of 10.  He denies any radiation.  He tried taking Zofran at home without relief of his symptoms. The patient does make a small amount of urine.  He states that May 20 he was visiting in [place].  He is having episodes of weakness and dizziness.  Patient had an in and out cath which showed urinary tract infection at an urgent care there.  Patient states he may have a urinary tract infection causing his symptoms today.    Past Medical History  Diagnosis Date  . ESRD on hemodialysis     [facility] [facility] on MWF, ESRD due to DM/HTN  . Recurrent vomiting   . Hyperlipidemia   . Thyroid disorder     takes sensipar  . Diabetes mellitus     controlling by weight/diet  . 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 GERD (gastroesophageal reflux disease)   . Hypertension   . Hyperthyroidism    Past  Surgical History  Procedure Laterality Date  . Gastric restriction surgery  12/06/09    lap band  . Dialysis fistula creation  2011  . Insertion of dialysis catheter  11/13/2011    Procedure: INSERTION OF DIALYSIS CATHETER;  Surgeon: Sherren Kerns, MD;  Location[facility] OR;  Service: Vascular;  Laterality: Right;  Insertion of 23cm dialysis catheter in right IJ  . Av fistula placement  12/05/2011    Procedure: ARTERIOVENOUS (AV) FISTULA CREATION;  Surgeon: Larina Earthly, MD;  Location: [facility] OR;  Service: Vascular;  Laterality: Left;  . Av fistula placement  01-23-2012    #1 ligation of 2 competing branches of left upper arm AVF   #2  superifical mobilization of left upper arm AVF    Family History  Problem Relation Age of Onset  . Hypertension Mother   . Diabetes Mother   . Hypertension Father   . Diabetes Father    History  Substance Use Topics  . Smoking status: Never Smoker   . Smokeless tobacco: Never Used  . Alcohol Use: No    Review of Systems Ten systems reviewed and are negative for acute change, except as noted in the HPI.   Allergies  Review of patient's allergies indicates no known allergies.  Home Medications   Current Outpatient Rx  Name  Route  Sig  Dispense  Refill  . calcium acetate (PHOSLO) 667 MG capsule   Oral   Take 1,334-2,001 mg by  mouth 3 (three) times daily with meals. Take 3 capsules with each of 3 daily meals, and 2 capsules with each of 2 daily snacks.         . dicyclomine (BENTYL) 20 MG tablet   Oral   Take 1 tablet (20 mg total) by mouth 2 (two) times daily.   20 tablet   0   . ezetimibe (ZETIA) 10 MG tablet   Oral   Take 10 mg by mouth at bedtime.          . gabapentin (NEURONTIN) 100 MG capsule   Oral   Take 100 mg by mouth at bedtime.         Marland Kitchen HYDROcodone-acetaminophen (VICODIN) 5-500 MG per tablet   Oral   Take 1 tablet by mouth every 6 (six) hours as needed for pain. For pain         . metoCLOPramide (REGLAN) 10 MG tablet    Oral   Take 10 mg by mouth 4 (four) times daily -  before meals and at bedtime.         . ondansetron (ZOFRAN ODT) 4 MG disintegrating tablet   Oral   Take 2 tablets (8 mg total) by mouth every 8 (eight) hours as needed for nausea.   20 tablet   0   . oxyCODONE-acetaminophen (PERCOCET) 5-325 MG per tablet   Oral   Take 1 tablet by mouth every 4 (four) hours as needed for pain.   20 tablet   0   . pantoprazole (PROTONIX) 40 MG tablet   Oral   Take 1 tablet (40 mg total) by mouth daily.   30 tablet   0   . promethazine (PHENERGAN) 25 MG suppository   Rectal   Place 1 suppository (25 mg total) rectally every 6 (six) hours as needed for nausea.   12 each   0    BP 147/82  Pulse 92  Temp(Src) 98.3 F (36.8 C) (Oral)  Resp 26  SpO2 100% Physical Exam  Nursing note and vitals reviewed. Constitutional: He appears well-developed and well-nourished. No distress.  Appears uncomfortable  HENT:  Head: Normocephalic and atraumatic.  Eyes: Conjunctivae are normal. No scleral icterus.  Neck: Normal range of motion. Neck supple.  Cardiovascular: Normal rate, regular rhythm and normal heart sounds.   Pulmonary/Chest: Effort normal and breath sounds normal. No respiratory distress.  Abdominal: Soft. There is tenderness.  Musculoskeletal: He exhibits no edema.  Neurological: He is alert.  Skin: Skin is warm and dry. He is not diaphoretic.  Psychiatric: His behavior is normal.    ED Course  Procedures (including critical care time) Labs Reviewed  CBC  BASIC METABOLIC PANEL   No results found. No diagnosis found.  MDM   Filed Vitals:   09/04/12 1445 09/04/12 1500 09/04/12 1515 09/04/12 1517  BP: 129/61 123/61 135/58 135/58  Pulse: 94 93 89 91  Temp:      TempSrc:      Resp: 23 25 18 21   SpO2: 95% 97% 99% 97%   Unable to access peripheral IV. Patient has no electrolyte abnormalities.  His troponin is negative. White cell count is elevated.  Elevated BUN/Cr Likely  due to volume depletion. Patient  States his nause is controlled, still has pain/ Patient had UTI 1` month go and states that he is conscerned for UTI today. He makes veryu little urine. I ahdve spoken with Pa trean who will assume careof the patient. UA is pending. Patient has ben  admitted several tiems with the same complaint. Last CT was 2/ 18/2014. Patient's chronic abdominal pain began after his lap band procedure.      Arthor Captain, PA-C 09/04/12 1630

## 2012-09-04 NOTE — ED Provider Notes (Signed)
Medical screening examination/treatment/procedure(s) were performed by non-physician practitioner and as supervising physician I was immediately available for consultation/collaboration.   Gilda Crease, MD 09/04/12 [phone]

## 2012-09-04 NOTE — ED Notes (Signed)
Per IV team, they were unable to get access.   Will advise Dr. Juleen China.

## 2012-09-04 NOTE — ED Notes (Signed)
Pt states last bowel movement was yesterday but is was not a lot. Pt states he normally goes 2-3xs a day except for yesterday.

## 2012-09-04 NOTE — ED Provider Notes (Signed)
Hx ESRD, chronic abd pain.  Awaits UA pending dispo, treats nausea.  Consider CT if pain not improved.    Will obtain an in and out cath as patient is unable to produce urine at this time. Patient otherwise states his pain is improving. Localized pain to mid abdomen. Denies any shortness of breath at this time. Report mild nausea.  6:30 PM Patient felt much better. Able to tolerates by mouth. Abdomen is soft nontender on reexamination. UA shows no evidence of urinary tract infection. Patient is stable for discharge. Patient will followup with his PCP for further management. Return percussion discussed.  BP 112/71  Pulse 81  Temp(Src) 98.3 F (36.8 C) (Oral)  Resp 13  SpO2 96%  I have reviewed nursing notes and vital signs. I personally reviewed the imaging tests through PACS system  I reviewed available ER/hospitalization records thought the EMR  Results for orders placed during the hospital encounter of 09/04/12  CBC      Result Value Range   WBC 10.6 (*) 4.0 - 10.5 K/uL   RBC 4.29  4.22 - 5.81 MIL/uL   Hemoglobin 13.4  13.0 - 17.0 g/dL   HCT 40.9  81.1 - 91.4 %   MCV 95.1  78.0 - 100.0 fL   MCH 31.2  26.0 - 34.0 pg   MCHC 32.8  30.0 - 36.0 g/dL   RDW 78.2  95.6 - 21.3 %   Platelets 184  150 - 400 K/uL  COMPREHENSIVE METABOLIC PANEL      Result Value Range   Sodium 137  135 - 145 mEq/L   Potassium 4.8  3.5 - 5.1 mEq/L   Chloride 95 (*) 96 - 112 mEq/L   CO2 26  19 - 32 mEq/L   Glucose, Bld 137 (*) 70 - 99 mg/dL   BUN 30 (*) 6 - 23 mg/dL   Creatinine, Ser 08.65 (*) 0.50 - 1.35 mg/dL   Calcium 9.5  8.4 - 78.4 mg/dL   Total Protein 8.4 (*) 6.0 - 8.3 g/dL   Albumin 3.9  3.5 - 5.2 g/dL   AST 28  0 - 37 U/L   ALT 20  0 - 53 U/L   Alkaline Phosphatase 108  39 - 117 U/L   Total Bilirubin 0.8  0.3 - 1.2 mg/dL   GFR calc non Af Amer 5 (*) >90 mL/min   GFR calc Af Amer 5 (*) >90 mL/min  LIPASE, BLOOD      Result Value Range   Lipase 20  11 - 59 U/L  URINE RAPID DRUG SCREEN  (HOSP PERFORMED)      Result Value Range   Opiates NONE DETECTED  NONE DETECTED   Cocaine NONE DETECTED  NONE DETECTED   Benzodiazepines NONE DETECTED  NONE DETECTED   Amphetamines NONE DETECTED  NONE DETECTED   Tetrahydrocannabinol NONE DETECTED  NONE DETECTED   Barbiturates NONE DETECTED  NONE DETECTED  URINALYSIS, ROUTINE W REFLEX MICROSCOPIC      Result Value Range   Color, Urine YELLOW  YELLOW   APPearance CLEAR  CLEAR   Specific Gravity, Urine 1.016  1.005 - 1.030   pH 8.5 (*) 5.0 - 8.0   Glucose, UA NEGATIVE  NEGATIVE mg/dL   Hgb urine dipstick TRACE (*) NEGATIVE   Bilirubin Urine SMALL (*) NEGATIVE   Ketones, ur NEGATIVE  NEGATIVE mg/dL   Protein, ur 696 (*) NEGATIVE mg/dL   Urobilinogen, UA 0.2  0.0 - 1.0 mg/dL   Nitrite NEGATIVE  NEGATIVE   Leukocytes, UA SMALL (*) NEGATIVE  URINE MICROSCOPIC-ADD ON      Result Value Range   Squamous Epithelial / LPF FEW (*) RARE   WBC, UA 0-2  <3 WBC/hpf   RBC / HPF 0-2  <3 RBC/hpf   Bacteria, UA RARE  RARE   Urine-Other LESS THAN 10 mL OF URINE SUBMITTED    POCT I-STAT TROPONIN I      Result Value Range   Troponin i, poc 0.03  0.00 - 0.08 ng/mL   Comment 3            Dg Chest Port 1 View  09/04/2012   *RADIOLOGY REPORT*  Clinical Data: Shortness of breath, chest pain  PORTABLE CHEST - 1 VIEW  Comparison: Chest x-ray of 06/20/2012  Findings: No active infiltrate or effusion is seen.  Mediastinal contours are stable.  The heart is within normal limits in size. No bony abnormality is seen.  IMPRESSION: No active lung disease.   Original Report Authenticated By: Dwyane Dee, M.D.      Fayrene Helper, PA-C 09/04/12 1832

## 2012-09-04 NOTE — ED Notes (Signed)
Patient states does not make urine - unable to collect.

## 2012-09-04 NOTE — ED Notes (Signed)
IV team called back and advised we need a line.  Patient advised that we are waiting on a line to be able to give meds.

## 2012-09-04 NOTE — ED Notes (Signed)
Per report from Tiffany, RN, IV start by RN unsuccessful.   Phlebotomy could not get blood.  IV team called.

## 2012-09-05 ENCOUNTER — Encounter (HOSPITAL_COMMUNITY): Payer: Self-pay | Admitting: *Deleted

## 2012-09-05 ENCOUNTER — Observation Stay (HOSPITAL_COMMUNITY)
Admission: EM | Admit: 2012-09-05 | Discharge: 2012-09-06 | Disposition: A | Discharge status: Home / Self Care | Payer: 59 | Attending: Internal Medicine | Admitting: Internal Medicine

## 2012-09-05 ENCOUNTER — Emergency Department (HOSPITAL_COMMUNITY): Payer: 59

## 2012-09-05 DIAGNOSIS — I10 Essential (primary) hypertension: Secondary | ICD-10-CM | POA: Diagnosis present

## 2012-09-05 DIAGNOSIS — I120 Hypertensive chronic kidney disease with stage 5 chronic kidney disease or end stage renal disease: Secondary | ICD-10-CM | POA: Insufficient documentation

## 2012-09-05 DIAGNOSIS — E1129 Type 2 diabetes mellitus with other diabetic kidney complication: Secondary | ICD-10-CM | POA: Insufficient documentation

## 2012-09-05 DIAGNOSIS — K5900 Constipation, unspecified: Principal | ICD-10-CM

## 2012-09-05 DIAGNOSIS — R109 Unspecified abdominal pain: Secondary | ICD-10-CM | POA: Diagnosis present

## 2012-09-05 DIAGNOSIS — R112 Nausea with vomiting, unspecified: Secondary | ICD-10-CM | POA: Diagnosis present

## 2012-09-05 DIAGNOSIS — Z79899 Other long term (current) drug therapy: Secondary | ICD-10-CM | POA: Insufficient documentation

## 2012-09-05 DIAGNOSIS — R1115 Cyclical vomiting syndrome unrelated to migraine: Secondary | ICD-10-CM | POA: Insufficient documentation

## 2012-09-05 DIAGNOSIS — Z992 Dependence on renal dialysis: Secondary | ICD-10-CM | POA: Diagnosis present

## 2012-09-05 DIAGNOSIS — N186 End stage renal disease: Secondary | ICD-10-CM | POA: Insufficient documentation

## 2012-09-05 DIAGNOSIS — R1110 Vomiting, unspecified: Secondary | ICD-10-CM | POA: Diagnosis present

## 2012-09-05 LAB — COMPREHENSIVE METABOLIC PANEL
ALT: 21 U/L (ref 0–53)
Albumin: 4.5 g/dL (ref 3.5–5.2)
Alkaline Phosphatase: 124 U/L — ABNORMAL HIGH (ref 39–117)
Chloride: 92 mEq/L — ABNORMAL LOW (ref 96–112)
GFR calc Af Amer: 7 mL/min — ABNORMAL LOW (ref >90)
Glucose, Bld: 124 mg/dL — ABNORMAL HIGH (ref 70–99)
Potassium: 3.3 mEq/L — ABNORMAL LOW (ref 3.5–5.1)
Sodium: 138 mEq/L (ref 135–145)
Total Protein: 8.9 g/dL — ABNORMAL HIGH (ref 6.0–8.3)

## 2012-09-05 LAB — CBC
HCT: 38.1 % — ABNORMAL LOW (ref 39.0–52.0)
Hemoglobin: 12.5 g/dL — ABNORMAL LOW (ref 13.0–17.0)
MCHC: 32.8 g/dL (ref 30.0–36.0)
RBC: 3.99 MIL/uL — ABNORMAL LOW (ref 4.22–5.81)

## 2012-09-05 MED ORDER — ONDANSETRON 8 MG PO TBDP
8.0000 mg | ORAL_TABLET | Freq: Three times a day (TID) | ORAL | Status: DC | PRN
  Filled 2012-09-05: qty 1

## 2012-09-05 MED ORDER — DOCUSATE SODIUM 100 MG PO CAPS
100.0000 mg | ORAL_CAPSULE | Freq: Two times a day (BID) | ORAL | Status: DC
  Administered 2012-09-05 – 2012-09-06 (×2): 100 mg via ORAL
  Filled 2012-09-05 (×2): qty 1

## 2012-09-05 MED ORDER — SODIUM CHLORIDE 0.9 % IV SOLN
INTRAVENOUS | Status: DC
  Administered 2012-09-05: 17:00:00 via INTRAVENOUS

## 2012-09-05 MED ORDER — ONDANSETRON HCL 4 MG/2ML IJ SOLN: 4.0000 mg | Freq: Three times a day (TID) | INTRAMUSCULAR | Status: AC | PRN

## 2012-09-05 MED ORDER — SODIUM CHLORIDE 0.9 % IV SOLN: INTRAVENOUS | Status: DC

## 2012-09-05 MED ORDER — HYDROMORPHONE HCL PF 1 MG/ML IJ SOLN
1.0000 mg | INTRAMUSCULAR | Status: AC | PRN
  Administered 2012-09-05: 1 mg via INTRAVENOUS
  Filled 2012-09-05: qty 1

## 2012-09-05 MED ORDER — HEPARIN SODIUM (PORCINE) 5000 UNIT/ML IJ SOLN
5000.0000 [IU] | Freq: Three times a day (TID) | INTRAMUSCULAR | Status: DC
  Administered 2012-09-05 – 2012-09-06 (×3): 5000 [IU] via SUBCUTANEOUS
  Filled 2012-09-05 (×4): qty 1

## 2012-09-05 MED ORDER — ACETAMINOPHEN 650 MG RE SUPP: 650.0000 mg | Freq: Four times a day (QID) | RECTAL | Status: DC | PRN

## 2012-09-05 MED ORDER — HYDROMORPHONE HCL PF 1 MG/ML IJ SOLN
1.0000 mg | Freq: Once | INTRAMUSCULAR | Status: AC
  Administered 2012-09-05: 1 mg via INTRAVENOUS
  Filled 2012-09-05: qty 1

## 2012-09-05 MED ORDER — GABAPENTIN 100 MG PO CAPS
100.0000 mg | ORAL_CAPSULE | Freq: Every day | ORAL | Status: DC
  Administered 2012-09-05: 100 mg via ORAL
  Filled 2012-09-05 (×2): qty 1

## 2012-09-05 MED ORDER — ACETAMINOPHEN 325 MG PO TABS: 650.0000 mg | ORAL_TABLET | Freq: Four times a day (QID) | ORAL | Status: DC | PRN

## 2012-09-05 MED ORDER — SENNA 8.6 MG PO TABS
1.0000 | ORAL_TABLET | Freq: Two times a day (BID) | ORAL | Status: DC
  Administered 2012-09-05 – 2012-09-06 (×2): 8.6 mg via ORAL
  Filled 2012-09-05 (×3): qty 1

## 2012-09-05 MED ORDER — LUBIPROSTONE 24 MCG PO CAPS
24.0000 ug | ORAL_CAPSULE | Freq: Two times a day (BID) | ORAL | Status: DC
  Administered 2012-09-05 – 2012-09-06 (×2): 24 ug via ORAL
  Filled 2012-09-05 (×4): qty 1

## 2012-09-05 MED ORDER — POLYETHYLENE GLYCOL 3350 17 G PO PACK: 17.0000 g | PACK | Freq: Every day | ORAL | Status: DC | PRN

## 2012-09-05 MED ORDER — FLEET ENEMA 7-19 GM/118ML RE ENEM
1.0000 | ENEMA | Freq: Once | RECTAL | Status: AC | PRN
  Filled 2012-09-05: qty 1

## 2012-09-05 MED ORDER — SORBITOL 70 % SOLN
30.0000 mL | Freq: Every day | Status: DC | PRN
  Administered 2012-09-05: 30 mL via ORAL
  Filled 2012-09-05: qty 30

## 2012-09-05 MED ORDER — ONDANSETRON HCL 4 MG/2ML IJ SOLN
4.0000 mg | Freq: Once | INTRAMUSCULAR | Status: AC
  Administered 2012-09-05: 4 mg via INTRAVENOUS
  Filled 2012-09-05: qty 2

## 2012-09-05 MED ORDER — CALCIUM ACETATE 667 MG PO CAPS
1334.0000 mg | ORAL_CAPSULE | Freq: Three times a day (TID) | ORAL | Status: DC
  Administered 2012-09-05 – 2012-09-06 (×2): 2001 mg via ORAL
  Administered 2012-09-06: 1334 mg via ORAL
  Filled 2012-09-05 (×2): qty 1
  Filled 2012-09-05 (×2): qty 3
  Filled 2012-09-05: qty 1
  Filled 2012-09-05 (×2): qty 3

## 2012-09-05 MED ORDER — EZETIMIBE 10 MG PO TABS
10.0000 mg | ORAL_TABLET | Freq: Every day | ORAL | Status: DC
  Administered 2012-09-05: 10 mg via ORAL
  Filled 2012-09-05 (×2): qty 1

## 2012-09-05 NOTE — ED Notes (Signed)
IV team paged to remove dialysis catheter and de access the port

## 2012-09-05 NOTE — ED Notes (Signed)
Per EMS- pt has had N/V abdominal pain for 2 days. Pt was at dialysis today and did not finish treatment due to pain. Pt was seen for same yesterday. VSS with EMS

## 2012-09-05 NOTE — Progress Notes (Signed)
Pt dialysis  Graft deaccessed ,no bleeding noted,pressure applied ,pt tolerated well.Rn aware.

## 2012-09-05 NOTE — H&P (Signed)
[facility] History and Physical  Randall [name] ZOX:[id number] DOB: 1965/06/09 DOA: 09/05/2012  Referring physician: Emergency Department PCP: [name]., MD  Specialists:   Chief Complaint: Abdominal Pain  HPI: Randall [name] is a 47 y.o. male with a hx of ESRD on MWF HD, last HD was on the day of admission where the session was stopped one hour prematurely secondary to abdominal pain. The patient reports sx began 2 days ago, where he presented to the ED and was given narcotics. The sx improved briefly, but the patient returned again. On further questioning, the patient was noted to have constipation described as "passing a rabbit pellet" that started around the time of sx onset. Pt has not tried any bowel regimen prior to admission. In the ED, the patient was again given IV opioids. The patient, however, is still unable to tolerate PO. The hospitalists were consulted for possible admission.  Review of Systems: Abdominal pain, n/v, decreased appetite, all other review of systems reviewed and are negative  Past Medical History  Diagnosis Date  . ESRD on hemodialysis     [facility] on MWF, ESRD due to DM/HTN  . Recurrent vomiting   . Hyperlipidemia   . Thyroid disorder     takes sensipar  . Diabetes mellitus     controlling by weight/diet  . 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 GERD (gastroesophageal reflux disease)   . Hypertension   . Hyperthyroidism    Past Surgical History  Procedure Laterality Date  . Gastric restriction surgery  12/06/09    lap band  . Dialysis fistula creation  2011  . Insertion of dialysis catheter  11/13/2011    Procedure: INSERTION OF DIALYSIS CATHETER;  Surgeon: Sherren Kerns, MD;  Location: [facility] OR;  Service: Vascular;  Laterality: Right;  Insertion of 23cm dialysis catheter in right IJ  . Av fistula placement  12/05/2011    Procedure: ARTERIOVENOUS (AV) FISTULA CREATION;  Surgeon: Larina Earthly, MD;  Location: [facility] OR;  Service:  Vascular;  Laterality: Left;  . Av fistula placement  01-23-2012    #1 ligation of 2 competing branches of left upper arm AVF   #2  superifical mobilization of left upper arm AVF    Social History:  reports that he has never smoked. He has never used smokeless tobacco. He reports that he does not drink alcohol or use illicit drugs.  No Known Allergies  Family History  Problem Relation Age of Onset  . Hypertension Mother   . Diabetes Mother   . Hypertension Father   . Diabetes Father     Prior to Admission medications   Medication Sig Start Date End Date Taking? Authorizing Provider  calcium acetate (PHOSLO) 667 MG capsule Take 1,334-2,001 mg by mouth 3 (three) times daily with meals. Take 3 capsules with each of 3 daily meals, and 2 capsules with each of 2 daily snacks.   Yes Historical Provider, MD  ezetimibe (ZETIA) 10 MG tablet Take 10 mg by mouth at bedtime.    Yes Historical Provider, MD  gabapentin (NEURONTIN) 100 MG capsule Take 100 mg by mouth at bedtime.   Yes Historical Provider, MD  ondansetron (ZOFRAN ODT) 4 MG disintegrating tablet Take 2 tablets (8 mg total) by mouth every 8 (eight) hours as needed for nausea. 09/04/12  Yes Fayrene Helper, PA-C   Physical Exam: Filed Vitals:   09/05/12 0946 09/05/12 1256 09/05/12 1500  BP: 151/69 165/90 175/82  Pulse: 87 107  Temp: 98.6 F (37 C)    TempSrc: Oral    Resp: 16 20   SpO2: 94%       General:  Awake, in nad  Eyes: PERRL  ENT: membranes moist, dentition fair  Neck: trachea midline, neck supple  Cardiovascular: regular, s1, s2  Respiratory: normal resp effort, no wheezing  Abdomen: decreased BS, diffusely tender  Skin: no abnormal skin lesions seen  Musculoskeletal: perfused distally, no clubbing  Psychiatric: appears normal  Neurologic: cn2-12 grossly intact, strength and sensation intact  Labs on Admission:  Basic Metabolic Panel:  Recent Labs Lab 09/04/12 1326 09/05/12 1131  NA 137 138  K 4.8  3.3*  CL 95* 92*  CO2 26 29  GLUCOSE 137* 124*  BUN 30* 21  CREATININE 11.47* 8.74*  CALCIUM 9.5 9.6   Liver Function Tests:  Recent Labs Lab 09/04/12 1326 09/05/12 1131  AST 28 25  ALT 20 21  ALKPHOS 108 124*  BILITOT 0.8 0.9  PROT 8.4* 8.9*  ALBUMIN 3.9 4.5    Recent Labs Lab 09/04/12 1326 09/05/12 1131  LIPASE 20 18   No results found for this basename: AMMONIA,  in the last 168 hours CBC:  Recent Labs Lab 09/04/12 1326  WBC 10.6*  HGB 13.4  HCT 40.8  MCV 95.1  PLT 184   Cardiac Enzymes: No results found for this basename: CKTOTAL, CKMB, CKMBINDEX, TROPONINI,  in the last 168 hours  BNP (last 3 results) No results found for this basename: PROBNP,  in the last 8760 hours CBG: No results found for this basename: GLUCAP,  in the last 168 hours  Radiological Exams on Admission: Dg Chest Port 1 View  09/04/2012   *RADIOLOGY REPORT*  Clinical Data: Shortness of breath, chest pain  PORTABLE CHEST - 1 VIEW  Comparison: Chest x-ray of 06/20/2012  Findings: No active infiltrate or effusion is seen.  Mediastinal contours are stable.  The heart is within normal limits in size. No bony abnormality is seen.  IMPRESSION: No active lung disease.   Original Report Authenticated By: Dwyane Dee, M.D.   Dg Abd Acute W/chest  09/05/2012   *RADIOLOGY REPORT*  Clinical Data: Abdominal pain and vomiting.  Fever.  ACUTE ABDOMEN SERIES (ABDOMEN 2 VIEW & CHEST 1 VIEW)  Comparison: Chest x-ray dated 09/04/2012 and abdominal radiographs dated 06/20/2012  Findings: Heart and lungs appear normal.  No free air in the abdomen.  Lap band appears in good position.  No dilated loops of large or small bowel.  Evidence of prior cholecystectomy.  No acute osseous abnormality.  Surgical staples seen in the left upper quadrant.  IMPRESSION: Benign-appearing abdomen and chest.   Original Report Authenticated By: Francene Boyers, M.D.    Assessment/Plan Principal Problem:   Abdominal pain Active  Problems:   ESRD (end stage renal disease) on dialysis   Vomiting   Hypertension   Intractable nausea and vomiting  Abdominal pain: - Per history, no bowel movement since the onset of symptoms. - No evidence of bowel obstruction on imaging today - Consider aggressively treating constipation. Will order bowel regimen as needed - Will give a trial of Amitiza 24mg  - Admit to Obs status for now - Continue with clears and advance diet as tolerated  ESRD: - On MWF HD, followed by [facility]. - Since pt underwent HD today, will not consult Nephrology. However, should patient near his scheduled HD date, would consult Nephro for HD  HTN: - Stable. Cont meds  DVT Prophylaxis; -  Heparin for prophylaxis  Code Status: Full (must indicate code status--if unknown or must be presumed, indicate so) Family Communication: Pt in room (indicate person spoken with, if applicable, with phone number if by telephone) Disposition Plan: Pending (indicate anticipated LOS)  Time spent:  CHIU, STEPHEN K [facility] Pager [phone]  If 7PM-7AM, please contact night-coverage [email or web address] Password [facility] 09/05/2012, 3:43 PM

## 2012-09-05 NOTE — ED Notes (Signed)
Pt has dialysis port access attached. Pt stated he was 1/2 hr to completion of dialysis when c/o abd pain.  Pt sts dialysis RN advised he could return on Monday for next tx.

## 2012-09-05 NOTE — ED Notes (Signed)
Attempted to call report x 1  

## 2012-09-05 NOTE — ED Notes (Signed)
Admitting MD at bedside.

## 2012-09-05 NOTE — ED Notes (Signed)
Attempted to call report x2

## 2012-09-05 NOTE — ED Provider Notes (Signed)
History    CSN: [id number] Arrival date & time 09/05/12  4098  First MD Initiated Contact with Patient 09/05/12 1023     Chief Complaint  Patient presents with  . Abdominal Pain   (Consider location/radiation/quality/duration/timing/severity/associated sxs/prior Treatment) Patient is a 47 y.o. male presenting with abdominal pain.  Abdominal Pain Associated symptoms include abdominal pain. Pertinent negatives include no chest pain, no headaches and no shortness of breath.   patient with abdominal pain nausea and vomiting. Seen yesterday in the ED for the same pain. States he felt better yesterday and then worsened at dialysis. His upper abdominal pain. He has had nausea vomiting. He was recently admitted for the same. No fevers. He is still having bowel movements. He did most of his dialysis today. It was cut short due to the pain.  Past Medical History  Diagnosis Date  . ESRD on hemodialysis     [place] [facility] on MWF, ESRD due to DM/HTN  . Recurrent vomiting   . Hyperlipidemia   . Thyroid disorder     takes sensipar  . Diabetes mellitus     controlling by weight/diet  . 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 GERD (gastroesophageal reflux disease)   . Hypertension   . Hyperthyroidism    Past Surgical History  Procedure Laterality Date  . Gastric restriction surgery  12/06/09    lap band  . Dialysis fistula creation  2011  . Insertion of dialysis catheter  11/13/2011    Procedure: INSERTION OF DIALYSIS CATHETER;  Surgeon: Sherren Kerns, MD;  Location: [facility] OR;  Service: Vascular;  Laterality: Right;  Insertion of 23cm dialysis catheter in right IJ  . Av fistula placement  12/05/2011    Procedure: ARTERIOVENOUS (AV) FISTULA CREATION;  Surgeon: Larina Earthly, MD;  Location: [facility] OR;  Service: Vascular;  Laterality: Left;  . Av fistula placement  01-23-2012    #1 ligation of 2 competing branches of left upper arm AVF   #2  superifical mobilization of left upper arm AVF    Family  History  Problem Relation Age of Onset  . Hypertension Mother   . Diabetes Mother   . Hypertension Father   . Diabetes Father    History  Substance Use Topics  . Smoking status: Never Smoker   . Smokeless tobacco: Never Used  . Alcohol Use: No    Review of Systems  Constitutional: Negative for activity change and appetite change.  HENT: Negative for neck stiffness.   Eyes: Negative for pain.  Respiratory: Negative for chest tightness and shortness of breath.   Cardiovascular: Negative for chest pain and leg swelling.  Gastrointestinal: Positive for nausea, vomiting and abdominal pain. Negative for diarrhea.  Genitourinary: Negative for flank pain.  Musculoskeletal: Negative for back pain.  Skin: Negative for rash.  Neurological: Negative for weakness, numbness and headaches.  Psychiatric/Behavioral: Negative for behavioral problems.    Allergies  Review of patient's allergies indicates no known allergies.  Home Medications   Current Outpatient Rx  Name  Route  Sig  Dispense  Refill  . calcium acetate (PHOSLO) 667 MG capsule   Oral   Take 1,334-2,001 mg by mouth 3 (three) times daily with meals. Take 3 capsules with each of 3 daily meals, and 2 capsules with each of 2 daily snacks.         . ezetimibe (ZETIA) 10 MG tablet   Oral   Take 10 mg by mouth at bedtime.          Marland Kitchen  gabapentin (NEURONTIN) 100 MG capsule   Oral   Take 100 mg by mouth at bedtime.         . ondansetron (ZOFRAN ODT) 4 MG disintegrating tablet   Oral   Take 2 tablets (8 mg total) by mouth every 8 (eight) hours as needed for nausea.   20 tablet   0    BP 165/90  Pulse 107  Temp(Src) 98.6 F (37 C) (Oral)  Resp 20  SpO2 94% Physical Exam  Nursing note and vitals reviewed. Constitutional: He is oriented to person, place, and time. He appears well-developed and well-nourished.  Patient is vomiting  HENT:  Head: Normocephalic and atraumatic.  Eyes: EOM are normal. Pupils are equal,  round, and reactive to light.  Neck: Normal range of motion. Neck supple.  Cardiovascular: Regular rhythm and normal heart sounds.   No murmur heard. Tachycardia  Pulmonary/Chest: Effort normal and breath sounds normal.  Abdominal: Soft. He exhibits no distension and no mass. There is tenderness. There is no rebound and no guarding.  Musculoskeletal: Normal range of motion. He exhibits no edema.  Dialysis graft in bilateral upper extremities. Access still in left upper extremity.  Neurological: He is alert and oriented to person, place, and time. No cranial nerve deficit.  Skin: Skin is warm and dry.  Psychiatric: He has a normal mood and affect.    ED Course  Procedures (including critical care time) Labs Reviewed  COMPREHENSIVE METABOLIC PANEL - Abnormal; Notable for the following:    Potassium 3.3 (*)    Chloride 92 (*)    Glucose, Bld 124 (*)    Creatinine, Ser 8.74 (*)    Total Protein 8.9 (*)    Alkaline Phosphatase 124 (*)    GFR calc non Af Amer 6 (*)    GFR calc Af Amer 7 (*)    All other components within normal limits  LIPASE, BLOOD   Dg Chest Port 1 View  09/04/2012   *RADIOLOGY REPORT*  Clinical Data: Shortness of breath, chest pain  PORTABLE CHEST - 1 VIEW  Comparison: Chest x-ray of 06/20/2012  Findings: No active infiltrate or effusion is seen.  Mediastinal contours are stable.  The heart is within normal limits in size. No bony abnormality is seen.  IMPRESSION: No active lung disease.   Original Report Authenticated By: Dwyane Dee, M.D.   Dg Abd Acute W/chest  09/05/2012   *RADIOLOGY REPORT*  Clinical Data: Abdominal pain and vomiting.  Fever.  ACUTE ABDOMEN SERIES (ABDOMEN 2 VIEW & CHEST 1 VIEW)  Comparison: Chest x-ray dated 09/04/2012 and abdominal radiographs dated 06/20/2012  Findings: Heart and lungs appear normal.  No free air in the abdomen.  Lap band appears in good position.  No dilated loops of large or small bowel.  Evidence of prior cholecystectomy.  No  acute osseous abnormality.  Surgical staples seen in the left upper quadrant.  IMPRESSION: Benign-appearing abdomen and chest.   Original Report Authenticated By: Francene Boyers, M.D.   1. Intractable nausea and vomiting   2. Abdominal pain   3. ESRD (end stage renal disease) on dialysis     MDM  Patient with nausea and vomiting. History same. He also continued abdominal pain. Seen yesterday for same. His continued back pain. No relief. X-ray does not show obstruction. He'll be admitted for further evaluation and treatment.  Juliet Rude. Rubin Payor, MD 09/05/12 1439

## 2012-09-06 DIAGNOSIS — K5900 Constipation, unspecified: Secondary | ICD-10-CM

## 2012-09-06 LAB — COMPREHENSIVE METABOLIC PANEL
ALT: 16 U/L (ref 0–53)
Alkaline Phosphatase: 100 U/L (ref 39–117)
BUN: 30 mg/dL — ABNORMAL HIGH (ref 6–23)
CO2: 30 mEq/L (ref 19–32)
GFR calc Af Amer: 5 mL/min — ABNORMAL LOW (ref >90)
GFR calc non Af Amer: 5 mL/min — ABNORMAL LOW (ref >90)
Glucose, Bld: 107 mg/dL — ABNORMAL HIGH (ref 70–99)
Potassium: 3.8 mEq/L (ref 3.5–5.1)
Sodium: 139 mEq/L (ref 135–145)
Total Bilirubin: 0.5 mg/dL (ref 0.3–1.2)

## 2012-09-06 LAB — CBC
HCT: 36.3 % — ABNORMAL LOW (ref 39.0–52.0)
MCHC: 32 g/dL (ref 30.0–36.0)
Platelets: 184 10*3/uL (ref 150–400)
RDW: 15.4 % (ref 11.5–15.5)
WBC: 9.5 10*3/uL (ref 4.0–10.5)

## 2012-09-06 MED ORDER — LUBIPROSTONE 24 MCG PO CAPS: 24.0000 ug | ORAL_CAPSULE | Freq: Two times a day (BID) | ORAL | Status: DC

## 2012-09-06 MED ORDER — DSS 100 MG PO CAPS: 100.0000 mg | ORAL_CAPSULE | Freq: Two times a day (BID) | ORAL | Status: DC

## 2012-09-06 NOTE — Discharge Summary (Addendum)
Physician Discharge Summary  Randall [name] ZOX:[id number] DOB: 11-28-1965 DOA: 09/05/2012  PCP: [name]., MD  Admit date: 09/05/2012 Discharge date: 09/06/2012  Time spent: 30 minutes  Recommendations for Outpatient Follow-up:  1. Follow up with scheduled HD  Discharge Diagnoses:  Principal Problem:   Unspecified constipation Active Problems:   ESRD (end stage renal disease) on dialysis   Vomiting   Abdominal pain   Hypertension   Intractable nausea and vomiting   Discharge Condition: Improved  Diet recommendation: Regular, high fiber  Filed Weights   09/05/12 1732  Weight: 116.5 kg (256 lb 13.4 oz)    History of present illness:  Randall [name] is a 47 y.o. male with a hx of ESRD on MWF HD, last HD was on the day of admission where the session was stopped one hour prematurely secondary to abdominal pain. The patient reports sx began 2 days ago, where he presented to the ED and was given narcotics. The sx improved briefly, but the patient returned again. On further questioning, the patient was noted to have constipation described as "passing a rabbit pellet" that started around the time of sx onset. Pt has not tried any bowel regimen prior to admission. In the ED, the patient was again given IV opioids. The patient, however, is still unable to tolerate PO. The hospitalists were consulted for possible admission.  Hospital Course:  The patient was started on aggressive bowel regimen with marked stool output overnight which resolved abd pain. The patient will be started on Amitiza and stool softener for a suspected history of chronic constipation. The patient will follow up with dialysis as scheduled.  Discharge Exam: Filed Vitals:   09/05/12 1630 09/05/12 1732 09/05/12 2203 09/06/12 0559  BP: 172/97 168/102 160/81 130/67  Pulse: 90 79 81 83  Temp:  98 F (36.7 C) 98 F (36.7 C) 98.1 F (36.7 C)  TempSrc:  Oral Oral Oral  Resp:  19 18 18   Height:  5\' 11"  (1.803 m)     Weight:  116.5 kg (256 lb 13.4 oz)    SpO2: 95% 100% 99% 98%    General: Awake, in nad Cardiovascular: regular, s1, s2 Respiratory: normal resp effort, no wheezing or crackles  Discharge Instructions     Medication List         calcium acetate 667 MG capsule  Commonly known as:  PHOSLO  Take 1,334-2,001 mg by mouth 3 (three) times daily with meals. Take 3 capsules with each of 3 daily meals, and 2 capsules with each of 2 daily snacks.     DSS 100 MG Caps  Take 100 mg by mouth 2 (two) times daily.     ezetimibe 10 MG tablet  Commonly known as:  ZETIA  Take 10 mg by mouth at bedtime.     gabapentin 100 MG capsule  Commonly known as:  NEURONTIN  Take 100 mg by mouth at bedtime.     lubiprostone 24 MCG capsule  Commonly known as:  AMITIZA  Take 1 capsule (24 mcg total) by mouth 2 (two) times daily with a meal.     ondansetron 4 MG disintegrating tablet  Commonly known as:  ZOFRAN ODT  Take 2 tablets (8 mg total) by mouth every 8 (eight) hours as needed for nausea.       No Known Allergies Follow-up Information   Schedule an appointment as soon as possible for a visit with [name]., MD.   Contact information:   [address] [place] [address] [phone]       Follow up with Dialysis. (as scheduled)        The results of significant diagnostics from this hospitalization (including imaging, microbiology, ancillary and laboratory) are listed below for reference.    Significant Diagnostic Studies: Dg Chest Port 1 View  09/04/2012   *RADIOLOGY REPORT*  Clinical Data: Shortness of breath, chest pain  PORTABLE CHEST - 1 VIEW  Comparison: Chest x-ray of 06/20/2012  Findings: No active infiltrate or effusion is seen.  Mediastinal contours are stable.  The heart is within normal limits in size. No bony abnormality is seen.  IMPRESSION: No active lung disease.   Original Report Authenticated By: Dwyane Dee, M.D.   Dg Abd Acute  W/chest  09/05/2012   *RADIOLOGY REPORT*  Clinical Data: Abdominal pain and vomiting.  Fever.  ACUTE ABDOMEN SERIES (ABDOMEN 2 VIEW & CHEST 1 VIEW)  Comparison: Chest x-ray dated 09/04/2012 and abdominal radiographs dated 06/20/2012  Findings: Heart and lungs appear normal.  No free air in the abdomen.  Lap band appears in good position.  No dilated loops of large or small bowel.  Evidence of prior cholecystectomy.  No acute osseous abnormality.  Surgical staples seen in the left upper quadrant.  IMPRESSION: Benign-appearing abdomen and chest.   Original Report Authenticated By: Francene Boyers, M.D.    Microbiology: No results found for this or any previous visit (from the past 240 hour(s)).   Labs: Basic Metabolic Panel:  Recent Labs Lab 09/04/12 1326 09/05/12 1131 09/06/12 0405  NA 137 138 139  K 4.8 3.3* 3.8  CL 95* 92* 96  CO2 26 29 30   GLUCOSE 137* 124* 107*  BUN 30* 21 30*  CREATININE 11.47* 8.74* 11.45*  CALCIUM 9.5 9.6 8.6   Liver Function Tests:  Recent Labs Lab 09/04/12 1326 09/05/12 1131 09/06/12 0405  AST 28 25 20   ALT 20 21 16   ALKPHOS 108 124* 100  BILITOT 0.8 0.9 0.5  PROT 8.4* 8.9* 7.0  ALBUMIN 3.9 4.5 3.5    Recent Labs Lab 09/04/12 1326 09/05/12 1131  LIPASE 20 18   No results found for this basename: AMMONIA,  in the last 168 hours CBC:  Recent Labs Lab 09/04/12 1326 09/05/12 1205 09/06/12 0405  WBC 10.6* 9.7 9.5  HGB 13.4 12.5* 11.6*  HCT 40.8 38.1* 36.3*  MCV 95.1 95.5 96.0  PLT 184 173 184   Cardiac Enzymes: No results found for this basename: CKTOTAL, CKMB, CKMBINDEX, TROPONINI,  in the last 168 hours BNP: BNP (last 3 results) No results found for this basename: PROBNP,  in the last 8760 hours CBG: No results found for this basename: GLUCAP,  in the last 168 hours     Signed:  CHIU, STEPHEN K  [facility] 09/06/2012, 12:48 PM

## 2012-09-06 NOTE — ED Provider Notes (Signed)
Medical screening examination/treatment/procedure(s) were performed by non-physician practitioner and as supervising physician I was immediately available for consultation/collaboration.  Sharlena Kristensen, MD 09/06/12 1701 

## 2012-12-03 ENCOUNTER — Inpatient Hospital Stay (HOSPITAL_COMMUNITY)
Admission: EM | Admit: 2012-12-03 | Discharge: 2012-12-04 | DRG: 391 | Disposition: A | Discharge status: Home / Self Care | Payer: 59 | Attending: Internal Medicine | Admitting: Internal Medicine

## 2012-12-03 ENCOUNTER — Encounter (HOSPITAL_COMMUNITY): Payer: Self-pay | Admitting: Emergency Medicine

## 2012-12-03 ENCOUNTER — Emergency Department (HOSPITAL_COMMUNITY): Payer: 59

## 2012-12-03 DIAGNOSIS — I10 Essential (primary) hypertension: Secondary | ICD-10-CM

## 2012-12-03 DIAGNOSIS — K5289 Other specified noninfective gastroenteritis and colitis: Secondary | ICD-10-CM | POA: Diagnosis present

## 2012-12-03 DIAGNOSIS — Z23 Encounter for immunization: Secondary | ICD-10-CM

## 2012-12-03 DIAGNOSIS — I120 Hypertensive chronic kidney disease with stage 5 chronic kidney disease or end stage renal disease: Secondary | ICD-10-CM | POA: Diagnosis present

## 2012-12-03 DIAGNOSIS — R1110 Vomiting, unspecified: Secondary | ICD-10-CM

## 2012-12-03 DIAGNOSIS — K560 Paralytic ileus: Secondary | ICD-10-CM | POA: Diagnosis present

## 2012-12-03 DIAGNOSIS — N186 End stage renal disease: Secondary | ICD-10-CM | POA: Diagnosis present

## 2012-12-03 DIAGNOSIS — E872 Acidosis, unspecified: Secondary | ICD-10-CM | POA: Diagnosis present

## 2012-12-03 DIAGNOSIS — Z79899 Other long term (current) drug therapy: Secondary | ICD-10-CM

## 2012-12-03 DIAGNOSIS — R109 Unspecified abdominal pain: Secondary | ICD-10-CM | POA: Diagnosis present

## 2012-12-03 DIAGNOSIS — K219 Gastro-esophageal reflux disease without esophagitis: Secondary | ICD-10-CM | POA: Diagnosis present

## 2012-12-03 DIAGNOSIS — R112 Nausea with vomiting, unspecified: Secondary | ICD-10-CM

## 2012-12-03 DIAGNOSIS — G5790 Unspecified mononeuropathy of unspecified lower limb: Secondary | ICD-10-CM | POA: Diagnosis present

## 2012-12-03 DIAGNOSIS — Z992 Dependence on renal dialysis: Secondary | ICD-10-CM

## 2012-12-03 DIAGNOSIS — E119 Type 2 diabetes mellitus without complications: Secondary | ICD-10-CM | POA: Diagnosis present

## 2012-12-03 DIAGNOSIS — Z9884 Bariatric surgery status: Secondary | ICD-10-CM

## 2012-12-03 DIAGNOSIS — E785 Hyperlipidemia, unspecified: Secondary | ICD-10-CM | POA: Diagnosis present

## 2012-12-03 DIAGNOSIS — E875 Hyperkalemia: Secondary | ICD-10-CM | POA: Diagnosis present

## 2012-12-03 DIAGNOSIS — D649 Anemia, unspecified: Secondary | ICD-10-CM | POA: Diagnosis present

## 2012-12-03 DIAGNOSIS — K5900 Constipation, unspecified: Secondary | ICD-10-CM

## 2012-12-03 DIAGNOSIS — N2581 Secondary hyperparathyroidism of renal origin: Secondary | ICD-10-CM | POA: Diagnosis present

## 2012-12-03 DIAGNOSIS — R1013 Epigastric pain: Principal | ICD-10-CM | POA: Diagnosis present

## 2012-12-03 LAB — CBC WITH DIFFERENTIAL/PLATELET
Eosinophils Relative: 0 % (ref 0–5)
HCT: 41.6 % (ref 39.0–52.0)
Lymphs Abs: 1.2 10*3/uL (ref 0.7–4.0)
MCH: 33.3 pg (ref 26.0–34.0)
MCHC: 33.9 g/dL (ref 30.0–36.0)
MCV: 98.1 fL (ref 78.0–100.0)
Monocytes Absolute: 0.5 10*3/uL (ref 0.1–1.0)
Monocytes Relative: 4 % (ref 3–12)
Neutro Abs: 10.1 10*3/uL — ABNORMAL HIGH (ref 1.7–7.7)
Neutrophils Relative %: 86 % — ABNORMAL HIGH (ref 43–77)
Platelets: ADEQUATE 10*3/uL (ref 150–400)
RBC: 4.24 MIL/uL (ref 4.22–5.81)
WBC: 11.8 10*3/uL — ABNORMAL HIGH (ref 4.0–10.5)

## 2012-12-03 LAB — COMPREHENSIVE METABOLIC PANEL
BUN: 66 mg/dL — ABNORMAL HIGH (ref 6–23)
CO2: 18 mEq/L — ABNORMAL LOW (ref 19–32)
Calcium: 9.6 mg/dL (ref 8.4–10.5)
Chloride: 95 mEq/L — ABNORMAL LOW (ref 96–112)
Creatinine, Ser: 16.17 mg/dL — ABNORMAL HIGH (ref 0.50–1.35)
GFR calc Af Amer: 4 mL/min — ABNORMAL LOW (ref >90)
GFR calc non Af Amer: 3 mL/min — ABNORMAL LOW (ref >90)
Glucose, Bld: 124 mg/dL — ABNORMAL HIGH (ref 70–99)
Sodium: 138 mEq/L (ref 135–145)
Total Bilirubin: 0.5 mg/dL (ref 0.3–1.2)

## 2012-12-03 LAB — LIPASE, BLOOD: Lipase: 28 U/L (ref 11–59)

## 2012-12-03 MED ORDER — INSULIN ASPART 100 UNIT/ML ~~LOC~~ SOLN: 0.0000 [IU] | Freq: Three times a day (TID) | SUBCUTANEOUS | Status: DC

## 2012-12-03 MED ORDER — ONDANSETRON 4 MG PO TBDP
4.0000 mg | ORAL_TABLET | Freq: Once | ORAL | Status: AC
  Administered 2012-12-03: 4 mg via ORAL
  Filled 2012-12-03: qty 1

## 2012-12-03 MED ORDER — DOXERCALCIFEROL 4 MCG/2ML IV SOLN
14.0000 ug | INTRAVENOUS | Status: DC | PRN
  Administered 2012-12-03: 14 ug via INTRAVENOUS
  Filled 2012-12-03: qty 8

## 2012-12-03 MED ORDER — DOXERCALCIFEROL 4 MCG/2ML IV SOLN
INTRAVENOUS | Status: AC
  Filled 2012-12-03: qty 8

## 2012-12-03 MED ORDER — OXYCODONE-ACETAMINOPHEN 5-325 MG PO TABS
1.0000 | ORAL_TABLET | Freq: Four times a day (QID) | ORAL | Status: DC | PRN
  Administered 2012-12-04 (×2): 1 via ORAL
  Filled 2012-12-03: qty 1

## 2012-12-03 MED ORDER — SODIUM CHLORIDE 0.9 % IV BOLUS (SEPSIS): 1000.0000 mL | Freq: Once | INTRAVENOUS | Status: DC

## 2012-12-03 MED ORDER — MORPHINE SULFATE 4 MG/ML IJ SOLN
4.0000 mg | Freq: Once | INTRAMUSCULAR | Status: AC
  Administered 2012-12-03: 4 mg via INTRAMUSCULAR

## 2012-12-03 MED ORDER — MORPHINE SULFATE 4 MG/ML IJ SOLN
4.0000 mg | Freq: Once | INTRAMUSCULAR | Status: DC
  Filled 2012-12-03: qty 1

## 2012-12-03 MED ORDER — ONDANSETRON HCL 4 MG/2ML IJ SOLN: 4.0000 mg | Freq: Once | INTRAMUSCULAR | Status: DC

## 2012-12-03 NOTE — ED Notes (Signed)
Rn spoke to admitting MD. IR to place central line, MD sts will put stronger PO medications in for pain.

## 2012-12-03 NOTE — H&P (Addendum)
[facility] History and Physical  Randall [name]:[id number] DOB: [birth year] DOA: 12/03/2012  Referring physician: EDP PCP: [name]., MD   Chief Complaint: abdominal pain  HPI: Randall [name] is a 47 y.o. male with PMH of ESRD on HD MWF, obesity s/p Lap Banding 2 years ago, cholecystectomy, DM diet controlled, was in his usual state of health till yesterday, he ate chicken wings which he usually doesn't eat with mash potatoes, subsequently started having intense abdominal pain epigastric and L sided with vomiting x3, non bloody and non bilious. -He tried taking some sorbitol and had diarrhea x3 after this. -no fevers or chills. -HE still felt very sick this morning and continued to have some diffuse abdominal pain, hence missed dialysis today and presented to the ER.   Review of Systems: The patient denies anorexia, fever, weight loss,, vision loss, decreased hearing, hoarseness, chest pain, syncope, dyspnea on exertion, peripheral edema, balance deficits, hemoptysis, abdominal pain, melena, hematochezia, severe indigestion/heartburn, hematuria, incontinence, genital sores, muscle weakness, suspicious skin lesions, transient blindness, difficulty walking, depression, unusual weight change, abnormal bleeding, enlarged lymph nodes, angioedema, and breast masses.    Past Medical History  Diagnosis Date  . ESRD on hemodialysis     [place] [facility] on MWF, ESRD due to DM/HTN  . Recurrent vomiting   . Hyperlipidemia   . Thyroid disorder     takes sensipar  . Diabetes mellitus     controlling by weight/diet  . 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 GERD (gastroesophageal reflux disease)   . Hypertension   . Hyperthyroidism    Past Surgical History  Procedure Laterality Date  . Gastric restriction surgery  12/06/09    lap band  . Dialysis fistula creation  2011  . Insertion of dialysis catheter  11/13/2011    Procedure: INSERTION OF DIALYSIS CATHETER;  Surgeon: Sherren Kerns, MD;  Location: [facility] OR;  Service: Vascular;  Laterality: Right;  Insertion of 23cm dialysis catheter in right IJ  . Av fistula placement  12/05/2011    Procedure: ARTERIOVENOUS (AV) FISTULA CREATION;  Surgeon: Larina Earthly, MD;  Location: [facility] OR;  Service: Vascular;  Laterality: Left;  . Av fistula placement  01-23-2012    #1 ligation of 2 competing branches of left upper arm AVF   #2  superifical mobilization of left upper arm AVF    Social History:  reports that he has never smoked. He has never used smokeless tobacco. He reports that he does not drink alcohol or use illicit drugs. Lives at home with son  No Known Allergies  Family History  Problem Relation Age of Onset  . Hypertension Mother   . Diabetes Mother   . Hypertension Father   . Diabetes Father     Prior to Admission medications   Medication Sig Start Date End Date Taking? Authorizing Provider  calcium acetate (PHOSLO) 667 MG capsule Take 1,334-2,001 mg by mouth 3 (three) times daily with meals. Take 3 capsules with each of 3 daily meals, and 2 capsules with each of 2 daily snacks.   Yes Historical Provider, MD  docusate sodium (COLACE) 100 MG capsule Take 100 mg by mouth 2 (two) times daily as needed for constipation.   Yes Historical Provider, MD  gabapentin (NEURONTIN) 300 MG capsule Take 300 mg by mouth daily as needed (for nerve pain).   Yes Historical Provider, MD  lubiprostone (AMITIZA) 24 MCG capsule Take 1 capsule (24 mcg total) by mouth 2 (two)  times daily with a meal. 09/06/12  Yes Jerald Kief, MD  SENSIPAR 60 MG tablet Take 1 tablet by mouth at bedtime. 11/27/12  Yes Historical Provider, MD   Physical Exam: Filed Vitals:   12/03/12 1400  BP: 134/76  Pulse: 99  Temp:   Resp: 19     General: AAOx3  HEENt: PERRLA, EOMI  Cardiovascular: S1S2/RRR  Respiratory: CTAB  Abdomen: soft, obese, mild epigastric and L sided tenderness  Skin: skin scaly and dry  Musculoskeletal: no edema  c/c  Psychiatric: appropriate mood and affect  Neurologic: non focal  Labs on Admission:  Basic Metabolic Panel:  Recent Labs Lab 12/03/12 1111  NA 138  K 5.8*  CL 95*  CO2 18*  GLUCOSE 124*  BUN 66*  CREATININE 16.17*  CALCIUM 9.6   Liver Function Tests:  Recent Labs Lab 12/03/12 1111  AST 27  ALT 20  ALKPHOS 89  BILITOT 0.5  PROT 8.4*  ALBUMIN 3.9    Recent Labs Lab 12/03/12 1111  LIPASE 28   No results found for this basename: AMMONIA,  in the last 168 hours CBC:  Recent Labs Lab 12/03/12 1111  WBC 11.8*  NEUTROABS 10.1*  HGB 14.1  HCT 41.6  MCV 98.1  PLT PLATELETS APPEAR ADEQUATE   Cardiac Enzymes: No results found for this basename: CKTOTAL, CKMB, CKMBINDEX, TROPONINI,  in the last 168 hours  BNP (last 3 results) No results found for this basename: PROBNP,  in the last 8760 hours CBG: No results found for this basename: GLUCAP,  in the last 168 hours  Radiological Exams on Admission: Dg Abd Acute W/chest  12/03/2012   CLINICAL DATA:  Mid abdominal pain  EXAM: ACUTE ABDOMEN SERIES (ABDOMEN 2 VIEW & CHEST 1 VIEW)  COMPARISON:  09/05/2012  FINDINGS: Cardiomediastinal silhouette is unremarkable. No acute infiltrate or pleural effusion. No pulmonary edema.  Mild gaseous distended small bowel loops mid abdomen suspicious for ileus or enteritis. There is a gastric lap band in expected position of proximal stomach. Postcholecystectomy surgical clips are noted. No free abdominal air.  IMPRESSION: No acute disease within chest. Mild gaseous distended small bowel loops mid abdomen suspicious for ileus or enteritis. No free abdominal air. Gastric lap band in expected location of proximal stomach.   Electronically Signed   By: Natasha Mead   On: 12/03/2012 13:02    EKG: Independently reviewed. No acute St T wave changes  Assessment/Plan   1. Abdominal pain -etiology unclear, chronic intermittent -Worsened after eating chicken wings, could be  gastroenteritis, KUB nonspecific -multiple prior CT scans have been benign, s/p cholecystectomy, LFts/lipase normal -No signs of acute abdomen -supportive care, IVf, anti-emetics, PPI -clears, advance diet as tolerated -if does not improve with supportive care, will need further evaluation, including considering Lap Band as possible etiology  2. ESRD/with Hyperkalemia, metabolic acidosis -Needs HD today -Renal notified   3. HTN -stable  4. DM -diet controlled, add SSI  5. Access Issues -IR consulted initially who were unable to place [address] today, PCCM consulted, d/w Dr.Wright  DVT proph: Lovenox  Code Status: Full Code Family Communication: none at bedside Disposition Plan: inpatient  Time spent:  [facility] Pager [phone]  If 7PM-7AM, please contact night-coverage [email or web address] Password [facility] 12/03/2012, 3:19 PM

## 2012-12-03 NOTE — ED Notes (Signed)
Katilyn PA at bedside.

## 2012-12-03 NOTE — Consult Note (Signed)
[facility] Renal Consultation Note    Indication for Consultation:  Management of ESRD/hemodialysis; anemia, hypertension/volume and secondary hyperparathyroidism  HPI: Randall [name] is a 47 y.o. male ESRD patient (MWF HD) with past medical history significant for hyperlipidemia, DM stated as diet controlled, GERD and obesity s/p Lab Band surgery in 2011 who presented to the ED via EMS with complaints of severe nausea, vomiting and abdominal pain. He states that he was in his usual state of health yesterday, until consuming chicken from [facility] . The abdominal pain occurred shortly after and he initially felt that he might simply be constipated. He took a laxative, which offered minimal relief, although he does endorse flatus and diarrhea. Dizziness when exiting the shower this morning and worsening pain are what prompted him to seek medical attention. At the time of this encounter, he is still in obvious discomfort with a pain score of 7/10.  His nausea has somewhat subsided. He denies further dizziness, has not vomited nor has he passed any stool today. His CT is concerning for ileus vs gastroenteritis and while his lap band is in the expected location, the possibility of some complication with the device has not been completely ruled out. He has not peripheral IV access and is scheduled for a central line in IR this evening.   He missed his scheduled dialysis treatment today and is mildly hyperkalemic at 5.8.    Past Medical History  Diagnosis Date  . ESRD on hemodialysis     [place] [facility] on MWF, ESRD due to DM/HTN  . Recurrent vomiting   . Hyperlipidemia   . Thyroid disorder     takes sensipar  . Diabetes mellitus     controlling by weight/diet  . 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 GERD (gastroesophageal reflux disease)   . Hypertension   . Hyperthyroidism    Past Surgical History  Procedure Laterality Date  . Gastric restriction surgery  12/06/09    lap band  .  Dialysis fistula creation  2011  . Insertion of dialysis catheter  11/13/2011    Procedure: INSERTION OF DIALYSIS CATHETER;  Surgeon: Sherren Kerns, MD;  Location: [facility] OR;  Service: Vascular;  Laterality: Right;  Insertion of 23cm dialysis catheter in right IJ  . Av fistula placement  12/05/2011    Procedure: ARTERIOVENOUS (AV) FISTULA CREATION;  Surgeon: Larina Earthly, MD;  Location: [facility] OR;  Service: Vascular;  Laterality: Left;  . Av fistula placement  01-23-2012    #1 ligation of 2 competing branches of left upper arm AVF   #2  superifical mobilization of left upper arm AVF    Family History  Problem Relation Age of Onset  . Hypertension Mother   . Diabetes Mother   . Hypertension Father   . Diabetes Father    Social History:  reports that he has never smoked. He has never used smokeless tobacco. He reports that he does not drink alcohol or use illicit drugs. No Known Allergies Prior to Admission medications   Medication Sig Start Date End Date Taking? Authorizing Provider  calcium acetate (PHOSLO) 667 MG capsule Take 1,334-2,001 mg by mouth 3 (three) times daily with meals. Take 3 capsules with each of 3 daily meals, and 2 capsules with each of 2 daily snacks.   Yes Historical Provider, MD  docusate sodium (COLACE) 100 MG capsule Take 100 mg by mouth 2 (two) times daily as needed for constipation.   Yes Historical Provider, MD  gabapentin (NEURONTIN) 300 MG capsule Take 300 mg by mouth daily as needed (for nerve pain).   Yes Historical Provider, MD  lubiprostone (AMITIZA) 24 MCG capsule Take 1 capsule (24 mcg total) by mouth 2 (two) times daily with a meal. 09/06/12  Yes Jerald Kief, MD  SENSIPAR 60 MG tablet Take 1 tablet by mouth at bedtime. 11/27/12  Yes Historical Provider, MD   Current Facility-Administered Medications  Medication Dose Route Frequency Provider Last Rate Last Dose  . morphine 4 MG/ML injection 4 mg  4 mg Intravenous Once [employer], PA-C      . ondansetron  [facility]) injection 4 mg  4 mg Intravenous Once [employer], PA-C      . sodium chloride 0.9 % bolus 1,000 mL  1,000 mL Intravenous Once Emilia Beck, PA-C       Current Outpatient Prescriptions  Medication Sig Dispense Refill  . calcium acetate (PHOSLO) 667 MG capsule Take 1,334-2,001 mg by mouth 3 (three) times daily with meals. Take 3 capsules with each of 3 daily meals, and 2 capsules with each of 2 daily snacks.      . docusate sodium (COLACE) 100 MG capsule Take 100 mg by mouth 2 (two) times daily as needed for constipation.      . gabapentin (NEURONTIN) 300 MG capsule Take 300 mg by mouth daily as needed (for nerve pain).      . lubiprostone (AMITIZA) 24 MCG capsule Take 1 capsule (24 mcg total) by mouth 2 (two) times daily with a meal.  60 capsule  0  . SENSIPAR 60 MG tablet Take 1 tablet by mouth at bedtime.       Labs: Basic Metabolic Panel:  Recent Labs Lab 12/03/12 1111  NA 138  K 5.8*  CL 95*  CO2 18*  GLUCOSE 124*  BUN 66*  CREATININE 16.17*  CALCIUM 9.6   Liver Function Tests:  Recent Labs Lab 12/03/12 1111  AST 27  ALT 20  ALKPHOS 89  BILITOT 0.5  PROT 8.4*  ALBUMIN 3.9    Recent Labs Lab 12/03/12 1111  LIPASE 28   No results found for this basename: AMMONIA,  in the last 168 hours CBC:  Recent Labs Lab 12/03/12 1111  WBC 11.8*  NEUTROABS 10.1*  HGB 14.1  HCT 41.6  MCV 98.1  PLT PLATELETS APPEAR ADEQUATE   Cardiac Enzymes: No results found for this basename: CKTOTAL, CKMB, CKMBINDEX, TROPONINI,  in the last 168 hours CBG: No results found for this basename: GLUCAP,  in the last 168 hours INR: @labcntip (inr:3)@ Iron Studies: No results found for this basename: IRON, TIBC, TRANSFERRIN, FERRITIN,  in the last 72 hours Studies/Results: Dg Abd Acute W/chest  12/03/2012   CLINICAL DATA:  Mid abdominal pain  EXAM: ACUTE ABDOMEN SERIES (ABDOMEN 2 VIEW & CHEST 1 VIEW)  COMPARISON:  09/05/2012  FINDINGS: Cardiomediastinal silhouette is  unremarkable. No acute infiltrate or pleural effusion. No pulmonary edema.  Mild gaseous distended small bowel loops mid abdomen suspicious for ileus or enteritis. There is a gastric lap band in expected position of proximal stomach. Postcholecystectomy surgical clips are noted. No free abdominal air.  IMPRESSION: No acute disease within chest. Mild gaseous distended small bowel loops mid abdomen suspicious for ileus or enteritis. No free abdominal air. Gastric lap band in expected location of proximal stomach.   Electronically Signed   By: Natasha Mead   On: 12/03/2012 13:02    ROS: Abdominal pain, flatus, nausea almost resolved. 10 pt  ROS asked and answered. All other systems negative except as above.   Physical Exam: Filed Vitals:   12/03/12 1530 12/03/12 1531 12/03/12 1545 12/03/12 1600  BP: 149/76 149/76 151/76 155/73  Pulse: 94 95 92 93  Temp:      TempSrc:      Resp: 19 18 17 19   SpO2: 95% 94% 97% 96%     General: Well developed, well nourished, in obvious discomfort. Head: Normocephalic, atraumatic, sclera non-icteric, mucus membranes are moist Neck: Supple. JVD not elevated. Lungs: Clear bilaterally to auscultation without wheezes, rales, or rhonchi. Breathing is unlabored. Heart: RRR with S1 S2. No murmurs, rubs, or gallops appreciated Soft SEM 1/6 LLSB Abdomen: Soft, obese, tender to light palpation in epigastric region and LUQ. +BS. No rebound/guarding. No obvious abdominal masses. M-S:  Strength and tone appear normal for age. Lower extremities: Trace LE edema. No ischemic changes, no open wounds  Neuro: Alert and oriented X 3. Moves all extremities spontaneously. Psych:  Responds to questions appropriately with a normal affect. Dialysis Access: LUA AVF, faint bruit No signs of access infection  Dialysis Orders: Center: [facility]  on MWF . EDW 121.5 kg HD Bath 2K/ 2.25 Ca OPTIFLUX 200  Time 4 hrs Heparin 10,000. Access L AVF  BFR 450 DFR 800   Hectorol 14 mcg IV/HD Epogen 0   Units  IV/HD  Venofer  50 q wk  Recent Labs: P 7.5, PTH 553, Tsat 30%. Consistently 1kg above EDW  Assessment/Plan: 1. Abdominal Pain - Work-up in progress per primary. Unclear etiology. S/p Lap Band surgery in 2011. CT abdomen suspicious for ileus or enteritis.  2. ESRD -  MWF, K+ 5.8. HD later tonight. 3. Hypertension/volume  - SBPs 140s-150s. No home BP meds. Consistently leaves 1kg above EDW as op. CXR clear. UF as tolerated. IVF d/c'd. 4. Anemia  - Hgb at goal here and as op. No ESAs. Last Tsat 30%. Hold weekly Fe for now. 5. Metabolic bone disease -  Ca 9.6 (9.7 corrected). Phos poorly controlled op on Fosrenol 1gm ac + Phoslo 3 ac. Hold binders and Sensipar 60 until sx's begin to resolve.. Last PTH 553. Continue Vit D. 6. Nutrition - Alb 3.9. On clears. Renal diet and multivitamin when appropriate 7. DM - diet controlled op. SSI here.  Claud Kelp, PA-C [facility] Pager [phone] 12/03/2012, 4:10 PM   Patient seen and examined.  I agree with assessment and plan as above with additions as indicated.  ESRD patient w abd pain, N/V and diarrhea after outing fast food chicken.  Stable from renal standpoint, plan HD tonight, K+ up at 5.8.   Vinson Moselle  MD Pager [phone]    Cell  [phone] 12/03/2012, 5:35 PM

## 2012-12-03 NOTE — ED Provider Notes (Signed)
CSN: [id number]     Arrival date & time 12/03/12  1109 History   First MD Initiated Contact with Patient 12/03/12 1109     Chief Complaint  Patient presents with  . Abdominal Pain   (Consider location/radiation/quality/duration/timing/severity/associated sxs/prior Treatment) HPI Comments: Patient is a 47 year old male with a past medical history of ESRD on MWF dialysis, chronic abdominal pain, GERD, and hypertension who presents with abdominal pain that started today. The pain is located in his generalized abdomen and does not radiate. The pain is described as cramping and severe. The pain started gradually and progressively worsened since the onset. No alleviating/aggravating factors. The patient has tried laxative for symptoms without relief. Associated symptoms include nausea, vomiting, and constipation. Patient denies fever, headache, diarrhea, chest pain, SOB, dysuria.    Patient is a 47 y.o. male presenting with abdominal pain.  Abdominal Pain Associated symptoms: constipation, nausea and vomiting     Past Medical History  Diagnosis Date  . ESRD on hemodialysis     [place] [facility] on MWF, ESRD due to DM/HTN  . Recurrent vomiting   . Hyperlipidemia   . Thyroid disorder     takes sensipar  . Diabetes mellitus     controlling by weight/diet  . 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 GERD (gastroesophageal reflux disease)   . Hypertension   . Hyperthyroidism    Past Surgical History  Procedure Laterality Date  . Gastric restriction surgery  12/06/09    lap band  . Dialysis fistula creation  2011  . Insertion of dialysis catheter  11/13/2011    Procedure: INSERTION OF DIALYSIS CATHETER;  Surgeon: Sherren Kerns, MD;  Location: [facility] OR;  Service: Vascular;  Laterality: Right;  Insertion of 23cm dialysis catheter in right IJ  . Av fistula placement  12/05/2011    Procedure: ARTERIOVENOUS (AV) FISTULA CREATION;  Surgeon: Larina Earthly, MD;  Location: [facility] OR;  Service: Vascular;   Laterality: Left;  . Av fistula placement  01-23-2012    #1 ligation of 2 competing branches of left upper arm AVF   #2  superifical mobilization of left upper arm AVF    Family History  Problem Relation Age of Onset  . Hypertension Mother   . Diabetes Mother   . Hypertension Father   . Diabetes Father    History  Substance Use Topics  . Smoking status: Never Smoker   . Smokeless tobacco: Never Used  . Alcohol Use: No    Review of Systems  Gastrointestinal: Positive for nausea, vomiting, abdominal pain and constipation.  All other systems reviewed and are negative.    Allergies  Review of patient's allergies indicates no known allergies.  Home Medications   Current Outpatient Rx  Name  Route  Sig  Dispense  Refill  . calcium acetate (PHOSLO) 667 MG capsule   Oral   Take 1,334-2,001 mg by mouth 3 (three) times daily with meals. Take 3 capsules with each of 3 daily meals, and 2 capsules with each of 2 daily snacks.         . docusate sodium 100 MG CAPS   Oral   Take 100 mg by mouth 2 (two) times daily.   30 capsule   0   . ezetimibe (ZETIA) 10 MG tablet   Oral   Take 10 mg by mouth at bedtime.          . gabapentin (NEURONTIN) 100 MG capsule   Oral   Take  100 mg by mouth at bedtime.         Marland Kitchen lubiprostone (AMITIZA) 24 MCG capsule   Oral   Take 1 capsule (24 mcg total) by mouth 2 (two) times daily with a meal.   60 capsule   0   . ondansetron (ZOFRAN ODT) 4 MG disintegrating tablet   Oral   Take 2 tablets (8 mg total) by mouth every 8 (eight) hours as needed for nausea.   20 tablet   0    BP 146/81  Pulse 97  Temp(Src) 98.4 F (36.9 C) (Oral)  Resp 18  SpO2 99% Physical Exam  Nursing note and vitals reviewed. Constitutional: He is oriented to person, place, and time. He appears well-developed and well-nourished. No distress.  HENT:  Head: Normocephalic and atraumatic.  Eyes: Conjunctivae and EOM are normal. Pupils are equal, round, and  reactive to light. No scleral icterus.  Neck: Normal range of motion.  Cardiovascular: Normal rate and regular rhythm.  Exam reveals no gallop and no friction rub.   No murmur heard. Pulmonary/Chest: Effort normal and breath sounds normal. He has no wheezes. He has no rales. He exhibits no tenderness.  Abdominal: Soft. He exhibits no distension. There is tenderness. There is no rebound and no guarding.  Mild epigastric tenderness to palpation. No other focal tenderness or peritoneal signs.   Musculoskeletal: Normal range of motion.  Neurological: He is alert and oriented to person, place, and time. Coordination normal.  Speech is goal-oriented. Moves limbs without ataxia.   Skin: Skin is warm and dry.  Psychiatric: He has a normal mood and affect. His behavior is normal.    ED Course  Procedures (including critical care time)   Date: 12/03/2012  Rate: 47  Rhythm: normal sinus rhythm  QRS Axis: normal  Intervals: normal  ST/T Wave abnormalities: normal  Conduction Disutrbances:none  Narrative Interpretation: NSR with T wave prominence changes from previous  Old EKG Reviewed: changes noted    Labs Review Labs Reviewed  CBC WITH DIFFERENTIAL - Abnormal; Notable for the following:    WBC 11.8 (*)    Neutrophils Relative % 86 (*)    Lymphocytes Relative 10 (*)    Neutro Abs 10.1 (*)    All other components within normal limits  COMPREHENSIVE METABOLIC PANEL - Abnormal; Notable for the following:    Potassium 5.8 (*)    Chloride 95 (*)    CO2 18 (*)    Glucose, Bld 124 (*)    BUN 66 (*)    Creatinine, Ser 16.17 (*)    Total Protein 8.4 (*)    GFR calc non Af Amer 3 (*)    GFR calc Af Amer 4 (*)    All other components within normal limits  URINE CULTURE  LIPASE, BLOOD  URINALYSIS, ROUTINE W REFLEX MICROSCOPIC  HEPATITIS B SURFACE ANTIGEN   Imaging Review Dg Abd Acute W/chest  12/03/2012   CLINICAL DATA:  Mid abdominal pain  EXAM: ACUTE ABDOMEN SERIES (ABDOMEN 2 VIEW &  CHEST 1 VIEW)  COMPARISON:  09/05/2012  FINDINGS: Cardiomediastinal silhouette is unremarkable. No acute infiltrate or pleural effusion. No pulmonary edema.  Mild gaseous distended small bowel loops mid abdomen suspicious for ileus or enteritis. There is a gastric lap band in expected position of proximal stomach. Postcholecystectomy surgical clips are noted. No free abdominal air.  IMPRESSION: No acute disease within chest. Mild gaseous distended small bowel loops mid abdomen suspicious for ileus or enteritis. No free abdominal air.  Gastric lap band in expected location of proximal stomach.   Electronically Signed   By: Natasha Mead   On: 12/03/2012 13:02    MDM   1. Abdominal pain   2. Hyperkalemia   3. ESRD (end stage renal disease) on dialysis   4. History of laparoscopic adjustable gastric banding   5. Hypertension     11:48 AM Labs and urinalysis pending. Patient will have acute abdominal series. Patient will have fluids, morphine and zofran. Vitals stable and patient afebrile.   2:07 PM Patient's potassium elevated as well as worsening BUN and Creatinine. I will admit patient to hospitalist for possible ileus and contact [facility] for possible dialysis today.    Emilia Beck, [place] 12/03/12 2032

## 2012-12-03 NOTE — ED Notes (Signed)
IV team attempted x2 with no success. Admitting MD at bedside- made aware of no IV access.

## 2012-12-03 NOTE — ED Notes (Signed)
Pt sts does not make urine. Kaitlin PA made aware. sts does not need to I&O cath pt. If unable to provide. Urinal placed at bedside in case pt able to provide sample.

## 2012-12-03 NOTE — ED Notes (Signed)
IV team at bedside 

## 2012-12-03 NOTE — ED Notes (Signed)
Asked pt to provide urine specimen pt stated he made very little urine and was not able to go at this time. Urinal placed at bedside.

## 2012-12-03 NOTE — ED Notes (Signed)
Attempted IV times 2 without success IV team called for access

## 2012-12-03 NOTE — ED Notes (Signed)
Kaitlin PA-C at bedside- updating pt on results.

## 2012-12-03 NOTE — ED Notes (Signed)
Flow manager updated- pt in hemodialysis.

## 2012-12-03 NOTE — ED Notes (Signed)
Pt here with c/o abd pain and nausea , pt took some laxatives yesterday to help him have a bm yesterday with minimal results according to patient, pt also took 8 mg of zofran with relief this morning for his nausea

## 2012-12-04 ENCOUNTER — Encounter (HOSPITAL_COMMUNITY): Payer: Self-pay | Admitting: General Practice

## 2012-12-04 DIAGNOSIS — R112 Nausea with vomiting, unspecified: Secondary | ICD-10-CM

## 2012-12-04 LAB — CBC
HCT: 37.7 % — ABNORMAL LOW (ref 39.0–52.0)
Hemoglobin: 12.5 g/dL — ABNORMAL LOW (ref 13.0–17.0)
MCHC: 33.2 g/dL (ref 30.0–36.0)
MCV: 99 fL (ref 78.0–100.0)
RBC: 3.81 MIL/uL — ABNORMAL LOW (ref 4.22–5.81)

## 2012-12-04 LAB — GLUCOSE, CAPILLARY: Glucose-Capillary: 95 mg/dL (ref 70–99)

## 2012-12-04 LAB — BASIC METABOLIC PANEL
BUN: 28 mg/dL — ABNORMAL HIGH (ref 6–23)
CO2: 26 mEq/L (ref 19–32)
Glucose, Bld: 114 mg/dL — ABNORMAL HIGH (ref 70–99)
Potassium: 4.3 mEq/L (ref 3.5–5.1)
Sodium: 141 mEq/L (ref 135–145)

## 2012-12-04 LAB — HEPATITIS B SURFACE ANTIGEN: Hepatitis B Surface Ag: NEGATIVE

## 2012-12-04 MED ORDER — OXYCODONE-ACETAMINOPHEN 5-325 MG PO TABS
ORAL_TABLET | ORAL | Status: AC
  Filled 2012-12-04: qty 1

## 2012-12-04 MED ORDER — CINACALCET HCL 30 MG PO TABS
60.0000 mg | ORAL_TABLET | Freq: Every day | ORAL | Status: DC
  Administered 2012-12-04: 09:00:00 60 mg via ORAL
  Filled 2012-12-04 (×2): qty 2

## 2012-12-04 MED ORDER — ENOXAPARIN SODIUM 30 MG/0.3ML ~~LOC~~ SOLN
30.0000 mg | SUBCUTANEOUS | Status: DC
  Administered 2012-12-04: 02:00:00 30 mg via SUBCUTANEOUS
  Filled 2012-12-04 (×2): qty 0.3

## 2012-12-04 MED ORDER — LUBIPROSTONE 24 MCG PO CAPS
24.0000 ug | ORAL_CAPSULE | Freq: Two times a day (BID) | ORAL | Status: DC
  Administered 2012-12-04: 09:00:00 24 ug via ORAL
  Filled 2012-12-04 (×3): qty 1

## 2012-12-04 MED ORDER — MORPHINE SULFATE 4 MG/ML IJ SOLN: 4.0000 mg | INTRAMUSCULAR | Status: DC | PRN

## 2012-12-04 MED ORDER — ACETAMINOPHEN 325 MG PO TABS: 650.0000 mg | ORAL_TABLET | Freq: Four times a day (QID) | ORAL | Status: DC | PRN

## 2012-12-04 MED ORDER — CALCIUM ACETATE 667 MG PO CAPS
1334.0000 mg | ORAL_CAPSULE | Freq: Two times a day (BID) | ORAL | Status: DC | PRN
  Filled 2012-12-04: qty 2

## 2012-12-04 MED ORDER — PANTOPRAZOLE SODIUM 40 MG IV SOLR
40.0000 mg | INTRAVENOUS | Status: DC
  Administered 2012-12-04: 40 mg via INTRAVENOUS
  Filled 2012-12-04 (×2): qty 40

## 2012-12-04 MED ORDER — OMEPRAZOLE 40 MG PO CPDR: 40.0000 mg | DELAYED_RELEASE_CAPSULE | Freq: Every day | ORAL | Status: DC

## 2012-12-04 MED ORDER — DIPHENHYDRAMINE HCL 25 MG PO CAPS
25.0000 mg | ORAL_CAPSULE | Freq: Once | ORAL | Status: AC
  Administered 2012-12-04: 04:00:00 25 mg via ORAL
  Filled 2012-12-04: qty 1

## 2012-12-04 MED ORDER — GABAPENTIN 300 MG PO CAPS
300.0000 mg | ORAL_CAPSULE | Freq: Every day | ORAL | Status: DC
Start: 2012-12-04 — End: 2012-12-04
  Administered 2012-12-04: 300 mg via ORAL
  Filled 2012-12-04 (×2): qty 1

## 2012-12-04 MED ORDER — ONDANSETRON HCL 4 MG/2ML IJ SOLN: 4.0000 mg | Freq: Four times a day (QID) | INTRAMUSCULAR | Status: DC | PRN

## 2012-12-04 MED ORDER — INFLUENZA VAC SPLIT QUAD 0.5 ML IM SUSP: 0.5000 mL | INTRAMUSCULAR | Status: DC

## 2012-12-04 MED ORDER — CALCIUM ACETATE 667 MG PO CAPS
2001.0000 mg | ORAL_CAPSULE | Freq: Three times a day (TID) | ORAL | Status: DC
  Administered 2012-12-04: 2001 mg via ORAL
  Filled 2012-12-04 (×4): qty 3

## 2012-12-04 MED ORDER — GABAPENTIN 300 MG PO CAPS: 300.0000 mg | ORAL_CAPSULE | Freq: Every day | ORAL | Status: DC

## 2012-12-04 NOTE — Discharge Summary (Signed)
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 male with PMH of ESRD on HD MWF, obesity s/p Lap Banding 2 years ago, cholecystectomy, DM diet controlled, was in his usual state of health till yesterday, he ate chicken wings which he usually doesn't eat with mash potatoes, subsequently started having intense abdominal pain epigastric and L sided with vomiting x3, non bloody and non bilious.    Hospital Course:  1. Abdominal pain; S/p Lap Band surgery in 2011. CT abdomen suspicious for ileus or enteritis  -etiology unclear, chronic intermittent  -Worsened after eating chicken wings, could be gastroenteritis, KUB nonspecific  -multiple prior CT scans have been benign, s/p cholecystectomy, LFts/lipase normal  -No signs of acute abdomen  -improved on supportive care, IVf, anti-emetics, PPI  --need further evaluation, including considering Lap Band as possible etiology; patient has appointment with bariatric surgeon next month   2. ESRD/with Hyperkalemia, metabolic acidosis  HD as scheduled  3. HTN stable  4. DM diet controlled, add SSI   Procedures:  X ray  (i.e. Studies not automatically included, echos, thoracentesis, etc; not x-rays)  Consultations:  No   Discharge Exam: Filed Vitals:   12/04/12 0633  BP: 119/64   Pulse: 81  Temp: 98.3 F (36.8 C)  Resp: 18    General: alert  Cardiovascular: s1s2 rrr Respiratory:  CTA BL   Discharge Instructions  Discharge Orders   Future Appointments Provider Department Dept Phone   12/18/2012 9:30 AM Max Maud Deed, DPM [facility] [phone]   12/19/2012 10:30 AM Valarie Merino, MD [facility], [place] [phone]   Future Orders Complete By Expires   Diet - low sodium heart healthy  As directed    Discharge instructions  As directed    Comments:     Follow up with bariatric surgeon outpatient as scheduled   Increase activity slowly  As directed        Medication List         calcium acetate 667 MG capsule  Commonly known as:  PHOSLO  Take 1,334-2,001 mg by mouth 3 (three) times daily with meals. Take 3 capsules with each of 3 daily meals, and 2 capsules with each of 2 daily snacks.     docusate sodium 100 MG capsule  Commonly known as:  COLACE  Take 100 mg by mouth 2 (two) times daily as needed for constipation.     gabapentin 300 MG capsule  Commonly known as:  NEURONTIN  Take 300 mg by mouth daily as needed (for nerve pain).     lubiprostone 24 MCG capsule  Commonly known as:  AMITIZA  Take 1 capsule (24 mcg total) by mouth 2 (two) times daily with a meal.     omeprazole 40 MG capsule  Commonly known as:  PRILOSEC  Take 1 capsule (40 mg total) by mouth daily.     SENSIPAR 60 MG tablet  Generic drug:  cinacalcet  Take 1 tablet by mouth at bedtime.       No Known Allergies     Follow-up Information   Follow up with [name]., MD In 1 week.   Specialty:  Internal Medicine   Contact information:   [address] [place] [address] [phone]        The results of significant diagnostics from this hospitalization (including imaging, microbiology, ancillary and laboratory) are listed below for reference.    Significant Diagnostic Studies: Dg Abd Acute  W/chest  12/03/2012   CLINICAL DATA:  Mid abdominal pain  EXAM: ACUTE ABDOMEN SERIES (ABDOMEN 2 VIEW & CHEST 1 VIEW)  COMPARISON:  09/05/2012  FINDINGS: Cardiomediastinal silhouette is unremarkable. No acute infiltrate or pleural effusion. No pulmonary edema.  Mild gaseous distended small bowel loops mid abdomen suspicious for ileus or enteritis. There is a gastric lap band in expected position of proximal stomach. Postcholecystectomy surgical clips are noted. No free abdominal air.  IMPRESSION: No acute disease within chest. Mild gaseous distended small bowel loops mid abdomen suspicious for ileus or enteritis. No free abdominal air. Gastric lap band in expected location of proximal stomach.   Electronically Signed   By: Natasha Mead   On: 12/03/2012 13:02    Microbiology: No results found for this or any previous visit (from the past 240 hour(s)).   Labs: Basic Metabolic Panel:  Recent Labs Lab 12/03/12 1111 12/04/12 0430  NA 138 141  K 5.8* 4.3  CL 95* 99  CO2 18* 26  GLUCOSE 124* 114*  BUN 66* 28*  CREATININE 16.17* 9.44*  CALCIUM 9.6 9.0   Liver Function Tests:  Recent Labs Lab 12/03/12 1111  AST 27  ALT 20  ALKPHOS 89  BILITOT 0.5  PROT 8.4*  ALBUMIN 3.9    Recent Labs Lab 12/03/12 1111  LIPASE 28   No results found for this basename: AMMONIA,  in the last 168 hours CBC:  Recent Labs Lab 12/03/12 1111 12/04/12 0430  WBC 11.8* 10.9*  NEUTROABS 10.1*  --   HGB 14.1 12.5*  HCT 41.6 37.7*  MCV 98.1 99.0  PLT PLATELETS APPEAR ADEQUATE 170   Cardiac Enzymes: No results found for this basename: CKTOTAL, CKMB, CKMBINDEX, TROPONINI,  in the last 168 hours BNP: BNP (last 3 results) No results found for this basename: PROBNP,  in the last 8760 hours CBG:  Recent Labs Lab 12/04/12 0921 12/04/12 1214  GLUCAP 97 95       Signed:  Tobenna Needs N  [facility] 12/04/2012, 12:26 PM

## 2012-12-04 NOTE — Progress Notes (Signed)
Patient discharged home.  IV removed.  No complications.  Telemetry monitor removed and signed back into log.  CMT notified.  Patient educated on discharge instructions.  AVF signed.  Prescription for prilosec given to patient.  Patient verbalized return of all belongings.  Escorted off unit via wheelchair.

## 2012-12-04 NOTE — Progress Notes (Signed)
[facility] Progress Note  Subjective:   Diffuse 6/10 abdominal pain. Endorses flatus. Neuropathic pain to bilateral feet secondary to missed dose of home gabapentin.  Objective Filed Vitals:   12/03/12 2330 12/04/12 0001 12/04/12 0042 12/04/12 0633  BP: 124/65 143/62 157/74 119/64  Pulse: 90 90 95 81  Temp:  98.4 F (36.9 C) 98.6 F (37 C) 98.3 F (36.8 C)  TempSrc:  Oral Oral Oral  Resp: 18 18 18 18   Height:   5\' 11"  (1.803 m)   Weight:  122.1 kg (269 lb 2.9 oz) 122.4 kg (269 lb 13.5 oz)   SpO2:  98% 100% 95%   Physical Exam General: Well developed, cooperative, obviously uncomfortable Heart: RRR Lungs: CTA bilaterally, no wheezes, rales, rhonchi Abdomen: Obese, soft, diffusely tender to light palpation, hyperactive BS, no rebound or guarding Extremities: No LE edema Dialysis Access: LUA AVF, + bruit  Dialysis Orders: Center: [facility] on MWF .  EDW 121.5 kg HD Bath 2K/ 2.25 Ca OPTIFLUX 200 Time 4 hrs Heparin 10,000. Access L AVF BFR 450 DFR 800  Hectorol 14 mcg IV/HD Epogen 0 Units IV/HD Venofer 50 q wk  Recent Labs: P 7.5, PTH 553, Tsat 30%. Consistently 1kg above EDW  Assessment/Plan: 1. Abdominal Pain - Unclear etiology. S/p Lap Band surgery in 2011. CT abdomen suspicious for ileus or enteritis. Work-up per primary 2. ESRD - MWF, K+ 4.3, next HD 9/26 3. Hypertension/volume - SBPs 120s-150s. No home BP meds. CXR clear. Net UF 1.6L with HD last evening. 4. Anemia - Hgb at goal here and as op. No ESAs. Last Tsat 30%. Hold weekly Fe for now. 5. Metabolic bone disease - Ca 9.0. Phos poorly controlled op on Fosrenol 1gm ac + Phoslo 3 ac. Hold binders and Sensipar 60 until sx's begin to resolve. Last PTH 553. Continue Vit D. 6. Nutrition - Alb 3.9. On clears. Renal diet and multivitamin when appropriate 7. DM - diet controlled op. SSI here. 8. Neuropathic pain - continue home dose of gabapentin  Scot Jun. Thad Ranger [facility] Pager  [phone] 12/04/2012,9:13 AM  LOS: 1 day   Patient seen and examined.  Agree with assessment and plan as above. Vinson Moselle  MD Pager [phone]    Cell  [phone] 12/04/2012, 1:26 PM     Additional Objective Labs: Basic Metabolic Panel:  Recent Labs Lab 12/03/12 1111 12/04/12 0430  NA 138 141  K 5.8* 4.3  CL 95* 99  CO2 18* 26  GLUCOSE 124* 114*  BUN 66* 28*  CREATININE 16.17* 9.44*  CALCIUM 9.6 9.0   Liver Function Tests:  Recent Labs Lab 12/03/12 1111  AST 27  ALT 20  ALKPHOS 89  BILITOT 0.5  PROT 8.4*  ALBUMIN 3.9    Recent Labs Lab 12/03/12 1111  LIPASE 28   CBC:  Recent Labs Lab 12/03/12 1111 12/04/12 0430  WBC 11.8* 10.9*  NEUTROABS 10.1*  --   HGB 14.1 12.5*  HCT 41.6 37.7*  MCV 98.1 99.0  PLT PLATELETS APPEAR ADEQUATE 170   Blood Culture No results found for this basename: sdes, specrequest, cult, reptstatus    Cardiac Enzymes: No results found for this basename: CKTOTAL, CKMB, CKMBINDEX, TROPONINI,  in the last 168 hours CBG: No results found for this basename: GLUCAP,  in the last 168 hours Iron Studies: No results found for this basename: IRON, TIBC, TRANSFERRIN, FERRITIN,  in the last 72 hours @lablastinr3 @ Studies/Results: Dg Abd Acute W/chest  12/03/2012  CLINICAL DATA:  Mid abdominal pain  EXAM: ACUTE ABDOMEN SERIES (ABDOMEN 2 VIEW & CHEST 1 VIEW)  COMPARISON:  09/05/2012  FINDINGS: Cardiomediastinal silhouette is unremarkable. No acute infiltrate or pleural effusion. No pulmonary edema.  Mild gaseous distended small bowel loops mid abdomen suspicious for ileus or enteritis. There is a gastric lap band in expected position of proximal stomach. Postcholecystectomy surgical clips are noted. No free abdominal air.  IMPRESSION: No acute disease within chest. Mild gaseous distended small bowel loops mid abdomen suspicious for ileus or enteritis. No free abdominal air. Gastric lap band in expected location of proximal stomach.    Electronically Signed   By: Natasha Mead   On: 12/03/2012 13:02   Medications:   . calcium acetate  2,001 mg Oral TID WC  . cinacalcet  60 mg Oral Q breakfast  . enoxaparin (LOVENOX) injection  30 mg Subcutaneous Q24H  . [START ON 12/05/2012] influenza vac split quadrivalent PF  0.5 mL Intramuscular Tomorrow-1000  . insulin aspart  0-9 Units Subcutaneous TID WC  . lubiprostone  24 mcg Oral BID WC  . oxyCODONE-acetaminophen      . pantoprazole (PROTONIX) IV  40 mg Intravenous Q24H

## 2012-12-04 NOTE — Progress Notes (Signed)
Utilization review completed. Sueko Dimichele, RN, BSN. 

## 2012-12-04 NOTE — Progress Notes (Signed)
[facility] PROGRESS NOTE  Randall [name] ZOX:[id number] DOB: Sep 30, 1965 DOA: 12/03/2012 PCP: [name]., MD  Assessment/Plan: 47 y.o. male with PMH of ESRD on HD MWF, obesity s/p Lap Banding 2 years ago, cholecystectomy, DM diet controlled, was in his usual state of health till yesterday, he ate chicken wings which he usually doesn't eat with mash potatoes, subsequently started having intense abdominal pain epigastric and L sided with vomiting x3, non bloody and non bilious.  1. Abdominal pain; S/p Lap Band surgery in 2011. CT abdomen suspicious for ileus or enteritis -etiology unclear, chronic intermittent  -Worsened after eating chicken wings, could be gastroenteritis, KUB nonspecific  -multiple prior CT scans have been benign, s/p cholecystectomy, LFts/lipase normal  -No signs of acute abdomen  -supportive care, IVf, anti-emetics, PPI  -advance diet as tolerated  -need further evaluation, including considering Lap Band as possible etiology; patient has appointment with bariatric surgeon next month  -likely d/c home today   2. ESRD/with Hyperkalemia, metabolic acidosis  -Renal notified   3. HTN stable   4. DM diet controlled, add SSI   Code Status: full  Family Communication: none at the bedside  (indicate person spoken with, relationship, and if by phone, the number) Disposition Plan: home    Consultants:  Nephrology   Procedures:  X ray abdomen   Antibiotics:  Non (indicate start date, and stop date if known)  HPI/Subjective: Alert, feels better   Objective: Filed Vitals:   12/04/12 0633  BP: 119/64  Pulse: 81  Temp: 98.3 F (36.8 C)  Resp: 18    Intake/Output Summary (Last 24 hours) at 12/04/12 0945 Last data filed at 12/04/12 8119  Gross per 24 hour  Intake    240 ml  Output   1621 ml  Net  -1381 ml   Filed Weights   12/03/12 1928 12/04/12 0001 12/04/12 0042  Weight: 123.7 kg (272 lb 11.3 oz) 122.1 kg (269 lb 2.9 oz) 122.4 kg (269  lb 13.5 oz)    Exam:   General:  Alert   Cardiovascular: s1, s2 rrr  Respiratory: cta bl   Abdomen: soft, mild epigastric tender, no rebound   Musculoskeletal: no LE edema    Data Reviewed: Basic Metabolic Panel:  Recent Labs Lab 12/03/12 1111 12/04/12 0430  NA 138 141  K 5.8* 4.3  CL 95* 99  CO2 18* 26  GLUCOSE 124* 114*  BUN 66* 28*  CREATININE 16.17* 9.44*  CALCIUM 9.6 9.0   Liver Function Tests:  Recent Labs Lab 12/03/12 1111  AST 27  ALT 20  ALKPHOS 89  BILITOT 0.5  PROT 8.4*  ALBUMIN 3.9    Recent Labs Lab 12/03/12 1111  LIPASE 28   No results found for this basename: AMMONIA,  in the last 168 hours CBC:  Recent Labs Lab 12/03/12 1111 12/04/12 0430  WBC 11.8* 10.9*  NEUTROABS 10.1*  --   HGB 14.1 12.5*  HCT 41.6 37.7*  MCV 98.1 99.0  PLT PLATELETS APPEAR ADEQUATE 170   Cardiac Enzymes: No results found for this basename: CKTOTAL, CKMB, CKMBINDEX, TROPONINI,  in the last 168 hours BNP (last 3 results) No results found for this basename: PROBNP,  in the last 8760 hours CBG:  Recent Labs Lab 12/04/12 0921  GLUCAP 97    No results found for this or any previous visit (from the past 240 hour(s)).   Studies: Dg Abd Acute W/chest  12/03/2012   CLINICAL DATA:  Mid abdominal pain  EXAM: ACUTE  ABDOMEN SERIES (ABDOMEN 2 VIEW & CHEST 1 VIEW)  COMPARISON:  09/05/2012  FINDINGS: Cardiomediastinal silhouette is unremarkable. No acute infiltrate or pleural effusion. No pulmonary edema.  Mild gaseous distended small bowel loops mid abdomen suspicious for ileus or enteritis. There is a gastric lap band in expected position of proximal stomach. Postcholecystectomy surgical clips are noted. No free abdominal air.  IMPRESSION: No acute disease within chest. Mild gaseous distended small bowel loops mid abdomen suspicious for ileus or enteritis. No free abdominal air. Gastric lap band in expected location of proximal stomach.   Electronically Signed    By: Natasha Mead   On: 12/03/2012 13:02    Scheduled Meds: . calcium acetate  2,001 mg Oral TID WC  . cinacalcet  60 mg Oral Q breakfast  . enoxaparin (LOVENOX) injection  30 mg Subcutaneous Q24H  . gabapentin  300 mg Oral QHS  . [START ON 12/05/2012] influenza vac split quadrivalent PF  0.5 mL Intramuscular Tomorrow-1000  . insulin aspart  0-9 Units Subcutaneous TID WC  . lubiprostone  24 mcg Oral BID WC  . oxyCODONE-acetaminophen      . 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 If 7PM-7AM, please contact night-coverage at [email or web address], password [facility] 12/04/2012, 9:45 AM  LOS: 1 day

## 2012-12-11 NOTE — ED Provider Notes (Signed)
Medical screening examination/treatment/procedure(s) were conducted as a shared visit with non-physician practitioner(s) and myself.  I personally evaluated the patient during the encounter  Patient was seen and examined. He continues to have nausea. Kidney function is poor as above. Patient is awake and oriented lucid. Plan is admission for renal evaluation and control of her nausea and rehydration.  Roney Marion, MD 12/11/12 [phone]

## 2012-12-18 ENCOUNTER — Encounter: Payer: Self-pay | Admitting: Podiatry

## 2012-12-18 ENCOUNTER — Ambulatory Visit (INDEPENDENT_AMBULATORY_CARE_PROVIDER_SITE_OTHER): Payer: 59 | Admitting: Podiatry

## 2012-12-18 VITALS — BP 132/84 | HR 98 | Resp 12 | Ht 71.0 in | Wt 270.0 lb

## 2012-12-18 DIAGNOSIS — E1149 Type 2 diabetes mellitus with other diabetic neurological complication: Secondary | ICD-10-CM

## 2012-12-18 DIAGNOSIS — Z79899 Other long term (current) drug therapy: Secondary | ICD-10-CM

## 2012-12-18 MED ORDER — GABAPENTIN 300 MG PO CAPS: ORAL_CAPSULE | ORAL | Status: DC

## 2012-12-18 NOTE — Progress Notes (Signed)
Subjective:     Patient ID: Randall [name], male   DOB: November 26, 1965, 47 y.o.   MRN: [id number]  HPI Randall [name] presents today for followup of his neuropathy. He's been taking 300 mg of Neurontin each bedtime. States that he is doing better than he was. Still having tenderness throughout the day.  Review of Systems Renal failure. Currently on dialysis.    Objective:   Physical Exam Vital signs are stable he is alert oriented x3. Pulses are palpable to the bilateral lower extremity. Sensorium per Semmes-Weinstein monofilament is decreased.    Assessment:     Evaluation and management for long-term therapy of neuropathy.    Plan:     We will increase his Neurontin to twice daily 300 mg once in the morning once in the evening followup with me in one month to 6 weeks for evaluation.

## 2012-12-19 ENCOUNTER — Encounter (INDEPENDENT_AMBULATORY_CARE_PROVIDER_SITE_OTHER): Payer: 59 | Admitting: Surgery

## 2012-12-22 ENCOUNTER — Encounter (INDEPENDENT_AMBULATORY_CARE_PROVIDER_SITE_OTHER): Payer: 59 | Admitting: Surgery

## 2012-12-26 ENCOUNTER — Ambulatory Visit (INDEPENDENT_AMBULATORY_CARE_PROVIDER_SITE_OTHER): Payer: 59 | Admitting: Surgery

## 2012-12-26 ENCOUNTER — Encounter (INDEPENDENT_AMBULATORY_CARE_PROVIDER_SITE_OTHER): Payer: Self-pay | Admitting: Surgery

## 2012-12-26 VITALS — BP 106/70 | HR 108 | Temp 97.8°F | Resp 15 | Ht 71.0 in | Wt 270.2 lb

## 2012-12-26 DIAGNOSIS — Z4651 Encounter for fitting and adjustment of gastric lap band: Secondary | ICD-10-CM

## 2012-12-26 NOTE — Progress Notes (Signed)
Lapband Fill Encounter Problem List:   Patient Active Problem List   Diagnosis Date Noted  . Abdominal pain 12/03/2012  . Unspecified constipation 09/06/2012  . Nausea and vomiting 04/30/2012  . Intractable nausea and vomiting 02/16/2012  . Vomiting 01/14/2012  . Hypertension 01/14/2012  . 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 ESRD (end stage renal disease) on dialysis 01/12/2011    Haskel Khan Body mass index is 37.7 kg/(m^2). Weight loss since surgery  55  Having regurgitation?:  no  Feel that they need a fill?  yes  Nocturnal reflux?  no  Amount of fill  0.5     Instructions given and weight loss goals discussed.    Hamp is waiting on a kidney transplant. Greggory Stallion Plonk recently placed a PD catheter. He is getting higher up on the transplant list and is anticipating that. His weight his drawn out just a bit. He is managed to figure out what was causing his abdominal pain and nose constipation and he is taking a new laxative for that. Overall he looks really good. I went ahead and added a half cc to his band as the one who will help to try to take out some more weight before his transplant. I will put down to see him as needed. We did stop about his dietary changes in the pre-transplant  In the fruits and vegetables to avoid because of their potassium and phosphate contents.  Matt B. Daphine Deutscher, MD, FACS

## 2012-12-26 NOTE — Patient Instructions (Signed)
Thanks for your patience.  If you need further assistance after leaving the office, please call our office and speak with a [facility] nurse.  [phone].  If you want to leave a message for Dr. Pamila Mendibles, please call his office phone at [phone]. 

## 2013-01-21 ENCOUNTER — Emergency Department (HOSPITAL_COMMUNITY): Payer: 59

## 2013-01-21 ENCOUNTER — Emergency Department (HOSPITAL_COMMUNITY)
Admission: EM | Admit: 2013-01-21 | Discharge: 2013-01-21 | Disposition: A | Discharge status: Home / Self Care | Payer: 59 | Attending: Emergency Medicine | Admitting: Emergency Medicine

## 2013-01-21 DIAGNOSIS — Z862 Personal history of diseases of the blood and blood-forming organs and certain disorders involving the immune mechanism: Secondary | ICD-10-CM | POA: Insufficient documentation

## 2013-01-21 DIAGNOSIS — N186 End stage renal disease: Secondary | ICD-10-CM | POA: Insufficient documentation

## 2013-01-21 DIAGNOSIS — R110 Nausea: Secondary | ICD-10-CM | POA: Insufficient documentation

## 2013-01-21 DIAGNOSIS — R1012 Left upper quadrant pain: Secondary | ICD-10-CM | POA: Insufficient documentation

## 2013-01-21 DIAGNOSIS — E0590 Thyrotoxicosis, unspecified without thyrotoxic crisis or storm: Secondary | ICD-10-CM | POA: Insufficient documentation

## 2013-01-21 DIAGNOSIS — Z9884 Bariatric surgery status: Secondary | ICD-10-CM | POA: Insufficient documentation

## 2013-01-21 DIAGNOSIS — Z79899 Other long term (current) drug therapy: Secondary | ICD-10-CM | POA: Insufficient documentation

## 2013-01-21 DIAGNOSIS — R1032 Left lower quadrant pain: Secondary | ICD-10-CM | POA: Insufficient documentation

## 2013-01-21 DIAGNOSIS — E119 Type 2 diabetes mellitus without complications: Secondary | ICD-10-CM | POA: Insufficient documentation

## 2013-01-21 DIAGNOSIS — Z8639 Personal history of other endocrine, nutritional and metabolic disease: Secondary | ICD-10-CM | POA: Insufficient documentation

## 2013-01-21 DIAGNOSIS — R109 Unspecified abdominal pain: Secondary | ICD-10-CM

## 2013-01-21 DIAGNOSIS — Z992 Dependence on renal dialysis: Secondary | ICD-10-CM | POA: Insufficient documentation

## 2013-01-21 DIAGNOSIS — R079 Chest pain, unspecified: Secondary | ICD-10-CM | POA: Insufficient documentation

## 2013-01-21 DIAGNOSIS — I120 Hypertensive chronic kidney disease with stage 5 chronic kidney disease or end stage renal disease: Secondary | ICD-10-CM | POA: Insufficient documentation

## 2013-01-21 DIAGNOSIS — R197 Diarrhea, unspecified: Secondary | ICD-10-CM | POA: Insufficient documentation

## 2013-01-21 DIAGNOSIS — K219 Gastro-esophageal reflux disease without esophagitis: Secondary | ICD-10-CM | POA: Insufficient documentation

## 2013-01-21 LAB — BODY FLUID CELL COUNT WITH DIFFERENTIAL
Eos, Fluid: 0 %
Lymphs, Fluid: 14 %
Monocyte-Macrophage-Serous Fluid: 71 % (ref 50–90)
Total Nucleated Cell Count, Fluid: 14 cu mm (ref 0–1000)

## 2013-01-21 LAB — CBC WITH DIFFERENTIAL/PLATELET
Basophils Relative: 0 % (ref 0–1)
HCT: 43.2 % (ref 39.0–52.0)
Hemoglobin: 14.2 g/dL (ref 13.0–17.0)
Lymphocytes Relative: 10 % — ABNORMAL LOW (ref 12–46)
Lymphs Abs: 1.4 10*3/uL (ref 0.7–4.0)
Monocytes Absolute: 0.7 10*3/uL (ref 0.1–1.0)
Monocytes Relative: 5 % (ref 3–12)
Neutro Abs: 12.4 10*3/uL — ABNORMAL HIGH (ref 1.7–7.7)
Neutrophils Relative %: 86 % — ABNORMAL HIGH (ref 43–77)
RBC: 4.48 MIL/uL (ref 4.22–5.81)
WBC: 14.5 10*3/uL — ABNORMAL HIGH (ref 4.0–10.5)

## 2013-01-21 LAB — LIPASE, BLOOD: Lipase: 34 U/L (ref 11–59)

## 2013-01-21 LAB — COMPREHENSIVE METABOLIC PANEL
Albumin: 4.2 g/dL (ref 3.5–5.2)
Alkaline Phosphatase: 96 U/L (ref 39–117)
BUN: 83 mg/dL — ABNORMAL HIGH (ref 6–23)
CO2: 19 mEq/L (ref 19–32)
Chloride: 87 mEq/L — ABNORMAL LOW (ref 96–112)
Creatinine, Ser: 22.81 mg/dL — ABNORMAL HIGH (ref 0.50–1.35)
GFR calc non Af Amer: 2 mL/min — ABNORMAL LOW (ref >90)
Potassium: 4.4 mEq/L (ref 3.5–5.1)
Sodium: 135 mEq/L (ref 135–145)
Total Bilirubin: 0.6 mg/dL (ref 0.3–1.2)

## 2013-01-21 LAB — PROTEIN, BODY FLUID: Total protein, fluid: <0.2 g/dL

## 2013-01-21 LAB — TROPONIN I: Troponin I: <0.3 ng/mL (ref <0.30)

## 2013-01-21 LAB — GLUCOSE, CAPILLARY: Glucose-Capillary: 131 mg/dL — ABNORMAL HIGH (ref 70–99)

## 2013-01-21 MED ORDER — HYDROCODONE-ACETAMINOPHEN 5-325 MG PO TABS: 1.0000 | ORAL_TABLET | ORAL | Status: DC | PRN

## 2013-01-21 MED ORDER — ONDANSETRON 4 MG PO TBDP
4.0000 mg | ORAL_TABLET | Freq: Once | ORAL | Status: AC
  Administered 2013-01-21: 4 mg via ORAL
  Filled 2013-01-21: qty 1

## 2013-01-21 MED ORDER — MORPHINE SULFATE 4 MG/ML IJ SOLN
4.0000 mg | Freq: Once | INTRAMUSCULAR | Status: AC
  Administered 2013-01-21: 4 mg via INTRAMUSCULAR
  Filled 2013-01-21: qty 1

## 2013-01-21 MED ORDER — MORPHINE SULFATE 4 MG/ML IJ SOLN
4.0000 mg | Freq: Once | INTRAMUSCULAR | Status: AC
  Administered 2013-01-21: 4 mg via INTRAVENOUS
  Filled 2013-01-21: qty 1

## 2013-01-21 NOTE — ED Provider Notes (Signed)
CSN: [id number]     Arrival date & time 01/21/13  1433 History   First MD Initiated Contact with Patient 01/21/13 1434     Chief Complaint  Patient presents with  . Abdominal Pain   (Consider location/radiation/quality/duration/timing/severity/associated sxs/prior Treatment) HPI Comments: Patient is a 47 yo M PMHx significant for ERSD on HD MWF, HLD, DM, GERD, HTN, Thyroid disorder presenting to the ED for 8/10 stabbing left sided abominal pain w/ radiation into chest w/ associated nausea and one episode of non-bloody diarrhea that began yesterday after his dialysis appointment. Patient states he went back to the dialysis center today and had left on and sent over to ED for evaluation of pain. Patient denies any fevers, vomiting, SOB. Patient does not make any of his own urine. His abdominal surgical history includes lap band placement and dialysis catheter placement.    Past Medical History  Diagnosis Date  . ESRD on hemodialysis     [place] [facility] on MWF, ESRD due to DM/HTN  . Recurrent vomiting   . Hyperlipidemia   . Thyroid disorder     takes sensipar  . Diabetes mellitus     controlling by weight/diet  . 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 GERD (gastroesophageal reflux disease)   . Hypertension   . Hyperthyroidism    Past Surgical History  Procedure Laterality Date  . Gastric restriction surgery  12/06/09    lap band  . Dialysis fistula creation  2011  . Insertion of dialysis catheter  11/13/2011    Procedure: INSERTION OF DIALYSIS CATHETER;  Surgeon: Sherren Kerns, MD;  Location: [facility] OR;  Service: Vascular;  Laterality: Right;  Insertion of 23cm dialysis catheter in right IJ  . Av fistula placement  12/05/2011    Procedure: ARTERIOVENOUS (AV) FISTULA CREATION;  Surgeon: Larina Earthly, MD;  Location: [facility] OR;  Service: Vascular;  Laterality: Left;  . Av fistula placement  01-23-2012    #1 ligation of 2 competing branches of left upper arm AVF   #2  superifical  mobilization of left upper arm AVF    Family History  Problem Relation Age of Onset  . Hypertension Mother   . Diabetes Mother   . Hypertension Father   . Diabetes Father    History  Substance Use Topics  . Smoking status: Never Smoker   . Smokeless tobacco: Never Used  . Alcohol Use: No    Review of Systems  Constitutional: Negative for fever.  HENT: Negative.   Eyes: Negative.   Respiratory: Negative for shortness of breath.   Cardiovascular: Positive for chest pain.  Gastrointestinal: Positive for nausea, abdominal pain and diarrhea. Negative for vomiting, blood in stool and anal bleeding.  Genitourinary: Negative.   Musculoskeletal: Negative.   Skin: Negative.   Neurological: Negative.     Allergies  Review of patient's allergies indicates no known allergies.  Home Medications   Current Outpatient Rx  Name  Route  Sig  Dispense  Refill  . calcium acetate (PHOSLO) 667 MG capsule   Oral   Take 1,334-2,001 mg by mouth 3 (three) times daily with meals. Take 3 capsules with each of 3 daily meals, and 2 capsules with each of 2 daily snacks.         . docusate sodium (COLACE) 100 MG capsule   Oral   Take 100 mg by mouth 2 (two) times daily as needed for constipation.         . gabapentin (NEURONTIN)  300 MG capsule   Oral   Take 300 mg by mouth daily as needed (for nerve pain).         Marland Kitchen gentamicin cream (GARAMYCIN) 0.1 %   Topical   Apply 1 application topically daily.          . hydrOXYzine (ATARAX/VISTARIL) 25 MG tablet   Oral   Take 25 mg by mouth every 8 (eight) hours as needed for itching.          . lubiprostone (AMITIZA) 24 MCG capsule   Oral   Take 1 capsule (24 mcg total) by mouth 2 (two) times daily with a meal.   60 capsule   0   . omeprazole (PRILOSEC) 40 MG capsule   Oral   Take 1 capsule (40 mg total) by mouth daily.   20 capsule   0   . SENSIPAR 60 MG tablet   Oral   Take 60 mg by mouth at bedtime.          Marland Kitchen  HYDROcodone-acetaminophen (NORCO/VICODIN) 5-325 MG per tablet   Oral   Take 1 tablet by mouth every 4 (four) hours as needed for moderate pain.   12 tablet   0    BP 142/79  Pulse 105  Temp(Src) 98.6 F (37 C) (Oral)  Resp 20  SpO2 95% Physical Exam  Constitutional: He is oriented to person, place, and time. He appears well-developed and well-nourished. No distress.  HENT:  Head: Normocephalic and atraumatic.  Right Ear: External ear normal.  Left Ear: External ear normal.  Nose: Nose normal.  Mouth/Throat: Oropharynx is clear and moist.  Eyes: Conjunctivae are normal.  Neck: Normal range of motion. Neck supple.  Cardiovascular: Normal rate, regular rhythm and normal heart sounds.   Pulmonary/Chest: Effort normal and breath sounds normal. No respiratory distress. He has no wheezes. He exhibits no tenderness.  Abdominal: Soft. Bowel sounds are normal. He exhibits no distension. There is tenderness in the left upper quadrant and left lower quadrant. There is no rigidity, no rebound, no guarding and no CVA tenderness.  Dialysis catheter w/o warmth or redness.   Musculoskeletal: Normal range of motion. He exhibits no edema.  Neurological: He is alert and oriented to person, place, and time.  Skin: Skin is warm and dry. He is not diaphoretic.    ED Course  Procedures (including critical care time)  Medications  morphine 4 MG/ML injection 4 mg (not administered)  morphine 4 MG/ML injection 4 mg (4 mg Intramuscular Given 01/21/13 1610)  ondansetron (ZOFRAN-ODT) disintegrating tablet 4 mg (4 mg Oral Given 01/21/13 1610)    Labs Review Labs Reviewed  GLUCOSE, CAPILLARY - Abnormal; Notable for the following:    Glucose-Capillary 131 (*)    All other components within normal limits  CBC WITH DIFFERENTIAL - Abnormal; Notable for the following:    WBC 14.5 (*)    Neutrophils Relative % 86 (*)    Neutro Abs 12.4 (*)    Lymphocytes Relative 10 (*)    All other components within  normal limits  COMPREHENSIVE METABOLIC PANEL - Abnormal; Notable for the following:    Chloride 87 (*)    Glucose, Bld 136 (*)    BUN 83 (*)    Creatinine, Ser 22.81 (*)    Total Protein 9.5 (*)    GFR calc non Af Amer 2 (*)    GFR calc Af Amer 2 (*)    All other components within normal limits  BODY FLUID CELL  COUNT WITH DIFFERENTIAL - Abnormal; Notable for the following:    Color, Fluid COLORLESS (*)    All other components within normal limits  BODY FLUID CULTURE  LIPASE, BLOOD  TROPONIN I  PROTEIN, BODY FLUID  GLUCOSE, SYNOVIAL FLUID   Imaging Review Ct Abdomen Pelvis Wo Contrast  01/21/2013   CLINICAL DATA:  Abdominal pain.  EXAM: CT ABDOMEN AND PELVIS WITHOUT CONTRAST  TECHNIQUE: Multidetector CT imaging of the abdomen and pelvis was performed following the standard protocol without intravenous contrast.  COMPARISON:  04/29/2012.  FINDINGS: The the lung bases are clear.  The liver is grossly normal without contrast. There is a small amount of fluid around liver and spleen. Surgical changes from gastric bypass surgery and gastric lap band. No obvious complicating features. The pancreas is grossly normal. The gallbladder is surgically absent. No significant common bile duct dilatation. The adrenal glands are slightly enlarged and nodular but no discrete mass. The native kidneys are small. No hydronephrosis or mass.  The stomach demonstrates surgical changes. The duodenum, small bowel and colon are grossly normal without oral contrast. A peritoneal dialysis catheter is noted. No complicating features are identified. There is a small amount of expected free pelvic fluid. The bladder appears normal. The seminal vesicles and prostate gland are unremarkable.  The bony structures are unremarkable.  IMPRESSION: Free abdominal and pelvic fluid expected with peritoneal dialysis.  No acute abdominal/ pelvic findings, mass lesions are adenopathy.  Postsurgical changes related to gastric bypass  surgery, gastric lap band and coli cystectomy.   Electronically Signed   By: Loralie Champagne M.D.   On: 01/21/2013 19:59   Dg Chest 2 View  01/21/2013   CLINICAL DATA:  Epigastric pain, nonsmoker  EXAM: CHEST  2 VIEW  COMPARISON:  12/03/2012  FINDINGS: The heart size and mediastinal contours are within normal limits. Both lungs are clear. The visualized skeletal structures are unremarkable.  IMPRESSION: No active cardiopulmonary disease.   Electronically Signed   By: Esperanza Heir M.D.   On: 01/21/2013 17:01    EKG Interpretation     Ventricular Rate:  100 PR Interval:  176 QRS Duration: 91 QT Interval:  376 QTC Calculation: 485 R Axis:   54 Text Interpretation:  Sinus tachycardia Probable left atrial enlargement Borderline prolonged QT interval Similar to prior            MDM   1. Abdominal pain     Afebrile, NAD, non-toxic appearing, AAOx4. Patient afebrile w/ left sided abdominal tenderness w/o guarding, distention, or rigidity.   Peritoneal fluid collected and sent for evaluation. Results wnl. However patient with leukocytosis, abdominal pain, will obtain CT scan of abdomen and pelvis to ensure no acute pathology.   On re-evaluation patient comfortable. Pain management. Sleeping in room. I have reviewed nursing notes, vital signs, and all appropriate lab and imaging results for this patient. CT scan unremarkable. Will send patient home with f/u with PCP in 1-2 days. Return precautions discussed. Patient is agreeable to plan. Will give patient one more round of pain medication prior to d/c. Will sign patient out to Antony Madura, PA-C Patient d/w with Dr. Gwendolyn Grant, agrees with plan.        Jeannetta Ellis, PA-C 01/21/13 2009

## 2013-01-21 NOTE — ED Notes (Signed)
Pt concerned about discharge dt continued left sided abdominal pain. PA Antony Madura spent significant amount of time with pt discussing pros and cons of admission and our findings today in the ER. Pt discharge instructions discussed at length. Pt now comfortably with plan of care. Pt currently calling dialysis nurse concerning when next to receive dialysis.

## 2013-01-21 NOTE — ED Provider Notes (Signed)
Patient discharged by Tyler Deis, PA-C at shift change. Patient on peritoneal dialysis and presented today for abdominal pain. CT without any acute abdominal/pelvic findings. Patient hemodynamically stable throughout ED course. Evaluated multiple times by Piepenbrink, PA-C and Dr. Elwin Mocha and found to be appropriate and stable for d/c.  Patient requesting to be admitted for pain control today as his abdominal pain makes him "not feel well". Patient states that he was told by his dialysis nurse to come to the ED for his complaints and he believes that he should be admitted for his pain complaints. I have had a lengthy discussion with the patient and explained to him that his work up and CT today were reassuring. I have also explained that no indications were found to admit him to the hospital today. I have stated to the patient that admission for pain control, only, comes at a risk of contracting other infections or diseases from being present in a hospital for an extended period of time. As patient's pain has been controlled in ED with morphine, I feel he is appropriate for discharge home to continue further management of pain with PO narcotics. I have urged the patient to follow up with his nephrologist and dialysis nurse regarding the plan with his dialysis as patient states to me multiple times that he "doesn't know what to do". Patient verbalizes to me that he understands the reasons for avoiding admission for his abdominal pain complaints at this time. Patient amenable to discharge and has agreed to try and manage his pain at home. Return precautions discussed and patient agreeable to plan with no unaddressed concerns.  Antony Madura, PA-C 01/21/13 2150

## 2013-01-21 NOTE — ED Notes (Addendum)
Per Tia, 6E nurse, removed from pt. PT now sleeping comfortably in bed.

## 2013-01-21 NOTE — ED Provider Notes (Signed)
Angiocath insertion Performed by: Dagmar Hait  Consent: Verbal consent obtained. Risks and benefits: risks, benefits and alternatives were discussed Time out: Immediately prior to procedure a "time out" was called to verify the correct patient, procedure, equipment, support staff and site/side marked as required.  Preparation: Patient was prepped and draped in the usual sterile fashion.  Vein Location: R AC  Yes Ultrasound Guided  Gauge: 20  Normal blood return and flush without difficulty Patient tolerance: Patient tolerated the procedure well with no immediate complications.     Dagmar Hait, MD 01/21/13 2005

## 2013-01-21 NOTE — ED Notes (Signed)
Radiology made aware that pt can be taken at any time.

## 2013-01-21 NOTE — ED Notes (Signed)
Pt reports 8/10 left sided abdominal pain. PA made aware.

## 2013-01-21 NOTE — ED Notes (Signed)
Nurse from 6E at bedside removing peritoneal fluid.

## 2013-01-21 NOTE — ED Notes (Signed)
Pt reports continued 8/10 abdominal pain. Pt sleeping comfortably. NAD.

## 2013-01-21 NOTE — ED Notes (Signed)
Per EMS: Pt from dialysis center with c/o abdominal pain x 2 days. Pt received dialysis yesterday but didn't finish. Pt had increase lethargy with EMS, slow to respond to questions but responding appropriately. Marland Kitchen CBG 133. AO x4.  129/91. 104 bpm. 93% RA.

## 2013-01-21 NOTE — ED Notes (Addendum)
Pt reports left sided abdominal pain x 2 days with diarrhea yesterday. Denies today.

## 2013-01-22 ENCOUNTER — Ambulatory Visit: Payer: 59 | Admitting: Podiatry

## 2013-01-22 NOTE — ED Provider Notes (Signed)
Medical screening examination/treatment/procedure(s) were performed by non-physician practitioner and as supervising physician I was immediately available for consultation/collaboration.  EKG Interpretation     Ventricular Rate:  100 PR Interval:  176 QRS Duration: 91 QT Interval:  376 QTC Calculation: 485 R Axis:   54 Text Interpretation:  Sinus tachycardia Probable left atrial enlargement Borderline prolonged QT interval Similar to prior              Joya Gaskins, MD 01/22/13 1416

## 2013-01-22 NOTE — ED Provider Notes (Signed)
Medical screening examination/treatment/procedure(s) were conducted as a shared visit with non-physician practitioner(s) and myself.  I personally evaluated the patient during the encounter.  EKG Interpretation     Ventricular Rate:  100 PR Interval:  176 QRS Duration: 91 QT Interval:  376 QTC Calculation: 485 R Axis:   54 Text Interpretation:  Sinus tachycardia Probable left atrial enlargement Borderline prolonged QT interval Similar to prior             Patient here with abdominal pain. Recently started PD. L sided abdominal pain, no rebound/guarding. Concern for possible peritonitis, cell count normal. CT normal. Instructed to f/u with PCP.  Dagmar Hait, MD 01/22/13 [phone]

## 2013-01-24 LAB — BODY FLUID CULTURE
Culture: NO GROWTH
Gram Stain: NONE SEEN

## 2013-01-27 ENCOUNTER — Encounter: Payer: Self-pay | Admitting: Podiatry

## 2013-01-27 ENCOUNTER — Ambulatory Visit (INDEPENDENT_AMBULATORY_CARE_PROVIDER_SITE_OTHER): Payer: 59 | Admitting: Podiatry

## 2013-01-27 VITALS — BP 94/67 | HR 99 | Resp 16 | Ht 71.0 in | Wt 270.0 lb

## 2013-01-27 DIAGNOSIS — M79609 Pain in unspecified limb: Secondary | ICD-10-CM

## 2013-01-27 DIAGNOSIS — Z79899 Other long term (current) drug therapy: Secondary | ICD-10-CM

## 2013-01-27 DIAGNOSIS — B351 Tinea unguium: Secondary | ICD-10-CM

## 2013-01-27 NOTE — Progress Notes (Signed)
Randall [name] presents today for followup of his Neurontin prescription. He states is doing well at night however it is keeping him sleepy in the mornings. He sesamoid to stop taking it in the morning. He states that his dialysis nurse is concerned about his toenails.  Objective: Pulses are palpable bilateral. Nails are thick yellow dystrophic clinically mycotic 1 through 5 bilateral. Neurologic sensorium is unchanged.  Assessment: Diabetic neuropathy. Pain in limb secondary to onychomycosis.  Plan: Debridement of nails in thickness and length as a covered service. Followup with him in 3 months. Advised him to continue taking the Neurontin twice daily 300 in the morning 300 mg in the evening. I will followup with him in 3 months.

## 2013-02-11 ENCOUNTER — Emergency Department (HOSPITAL_COMMUNITY): Payer: Medicare Other

## 2013-02-11 ENCOUNTER — Emergency Department (HOSPITAL_COMMUNITY)
Admission: EM | Admit: 2013-02-11 | Discharge: 2013-02-11 | Disposition: A | Discharge status: Home / Self Care | Payer: Medicare Other | Attending: Emergency Medicine | Admitting: Emergency Medicine

## 2013-02-11 ENCOUNTER — Encounter (HOSPITAL_COMMUNITY): Payer: Self-pay | Admitting: Emergency Medicine

## 2013-02-11 DIAGNOSIS — I120 Hypertensive chronic kidney disease with stage 5 chronic kidney disease or end stage renal disease: Secondary | ICD-10-CM | POA: Insufficient documentation

## 2013-02-11 DIAGNOSIS — Z992 Dependence on renal dialysis: Secondary | ICD-10-CM | POA: Insufficient documentation

## 2013-02-11 DIAGNOSIS — Z9889 Other specified postprocedural states: Secondary | ICD-10-CM | POA: Diagnosis not present

## 2013-02-11 DIAGNOSIS — Z9089 Acquired absence of other organs: Secondary | ICD-10-CM | POA: Insufficient documentation

## 2013-02-11 DIAGNOSIS — E0590 Thyrotoxicosis, unspecified without thyrotoxic crisis or storm: Secondary | ICD-10-CM | POA: Diagnosis not present

## 2013-02-11 DIAGNOSIS — N186 End stage renal disease: Secondary | ICD-10-CM | POA: Insufficient documentation

## 2013-02-11 DIAGNOSIS — E119 Type 2 diabetes mellitus without complications: Secondary | ICD-10-CM | POA: Diagnosis not present

## 2013-02-11 DIAGNOSIS — R1012 Left upper quadrant pain: Secondary | ICD-10-CM | POA: Diagnosis not present

## 2013-02-11 DIAGNOSIS — R112 Nausea with vomiting, unspecified: Secondary | ICD-10-CM | POA: Diagnosis not present

## 2013-02-11 DIAGNOSIS — R0602 Shortness of breath: Secondary | ICD-10-CM | POA: Diagnosis not present

## 2013-02-11 DIAGNOSIS — Z8719 Personal history of other diseases of the digestive system: Secondary | ICD-10-CM | POA: Insufficient documentation

## 2013-02-11 DIAGNOSIS — R1032 Left lower quadrant pain: Secondary | ICD-10-CM | POA: Diagnosis not present

## 2013-02-11 DIAGNOSIS — Z79899 Other long term (current) drug therapy: Secondary | ICD-10-CM | POA: Diagnosis not present

## 2013-02-11 DIAGNOSIS — R1033 Periumbilical pain: Secondary | ICD-10-CM | POA: Diagnosis present

## 2013-02-11 DIAGNOSIS — R109 Unspecified abdominal pain: Secondary | ICD-10-CM

## 2013-02-11 LAB — COMPREHENSIVE METABOLIC PANEL
ALT: 24 U/L (ref 0–53)
AST: 27 U/L (ref 0–37)
Alkaline Phosphatase: 115 U/L (ref 39–117)
CO2: 24 mEq/L (ref 19–32)
Chloride: 92 mEq/L — ABNORMAL LOW (ref 96–112)
GFR calc non Af Amer: 3 mL/min — ABNORMAL LOW (ref >90)
Glucose, Bld: 132 mg/dL — ABNORMAL HIGH (ref 70–99)
Sodium: 137 mEq/L (ref 135–145)
Total Bilirubin: 0.6 mg/dL (ref 0.3–1.2)

## 2013-02-11 LAB — CBC
HCT: 42.5 % (ref 39.0–52.0)
Hemoglobin: 14.3 g/dL (ref 13.0–17.0)
MCH: 33.1 pg (ref 26.0–34.0)
MCHC: 33.6 g/dL (ref 30.0–36.0)
Platelets: 231 10*3/uL (ref 150–400)
RBC: 4.32 MIL/uL (ref 4.22–5.81)
WBC: 10.9 10*3/uL — ABNORMAL HIGH (ref 4.0–10.5)

## 2013-02-11 LAB — LACTIC ACID, PLASMA: Lactic Acid, Venous: 2.1 mmol/L (ref 0.5–2.2)

## 2013-02-11 LAB — BODY FLUID CELL COUNT WITH DIFFERENTIAL: Total Nucleated Cell Count, Fluid: 3 cu mm (ref 0–1000)

## 2013-02-11 MED ORDER — PROMETHAZINE HCL 25 MG/ML IJ SOLN
25.0000 mg | Freq: Once | INTRAMUSCULAR | Status: AC
  Administered 2013-02-11: 25 mg via INTRAVENOUS
  Filled 2013-02-11: qty 1

## 2013-02-11 MED ORDER — IOHEXOL 300 MG/ML  SOLN
80.0000 mL | Freq: Once | INTRAMUSCULAR | Status: AC | PRN
  Administered 2013-02-11: 80 mL via INTRAVENOUS

## 2013-02-11 MED ORDER — MORPHINE SULFATE 4 MG/ML IJ SOLN
4.0000 mg | Freq: Once | INTRAMUSCULAR | Status: AC
  Administered 2013-02-11: 4 mg via INTRAVENOUS
  Filled 2013-02-11: qty 1

## 2013-02-11 MED ORDER — DIPHENHYDRAMINE HCL 25 MG PO CAPS
25.0000 mg | ORAL_CAPSULE | Freq: Once | ORAL | Status: AC
  Administered 2013-02-11: 25 mg via ORAL
  Filled 2013-02-11: qty 1

## 2013-02-11 MED ORDER — IOHEXOL 300 MG/ML  SOLN
25.0000 mL | INTRAMUSCULAR | Status: AC
  Administered 2013-02-11: 25 mL via ORAL

## 2013-02-11 NOTE — ED Provider Notes (Signed)
Medical screening examination/treatment/procedure(s) were performed by non-physician practitioner and as supervising physician I was immediately available for consultation/collaboration.  EKG Interpretation   None         Gavin Pound. Neziah Braley, MD 02/11/13 1419

## 2013-02-11 NOTE — ED Notes (Signed)
Spoke with IV team to start patients IV. Unsucessful attempt x1. Pt difficult stick. They will be down shortly.

## 2013-02-11 NOTE — ED Notes (Signed)
Sob and abd pain after coming off peritoneal dialysis machine this am states had this last month also

## 2013-02-11 NOTE — ED Notes (Signed)
CT notified pt received pain medication and is willing to get scan.

## 2013-02-11 NOTE — ED Provider Notes (Signed)
CSN: [id number]     Arrival date & time 02/11/13  0732 History   First MD Initiated Contact with Patient 02/11/13 0735     Chief Complaint  Patient presents with  . Shortness of Breath  . Abdominal Pain   (Consider location/radiation/quality/duration/timing/severity/associated sxs/prior Treatment) HPI Comments: Pt is a 47 y/o male with a PMHx of ESRD on peritoneal dialysis for the past month and a half, DM, GERD, HTN, hyperlipidemia and s/p gastric bypass in 2011 who presents to the ED complaining of abdominal pain beginning around 3:00 this morning while he was sleeping and receiving peritoneal dialysis. PD insertion site located on R side of abdomen, pain on left described as severe, constant, worse with pressure. Admits to associated nausea and nonbloody, nonbilious vomiting multiple times, now dry heaving. States he has chills, no fever. When walking from his car to the emergency room, he became short of breath and had difficulty catching his breath. Denies chest pain. He had similar symptoms a few weeks back and was seen in the emergency department on November 13, had a CT scan of his abdomen which did not have any acute findings at that time. Patient reports his pain is worse at this time. He does PD 7 nights a week while sleeping.  Patient is a 47 y.o. male presenting with shortness of breath and abdominal pain. The history is provided by the patient.  Shortness of Breath Associated symptoms: abdominal pain and vomiting   Abdominal Pain Associated symptoms: nausea, shortness of breath and vomiting     Past Medical History  Diagnosis Date  . ESRD on hemodialysis     [place] [facility] on MWF, ESRD due to DM/HTN  . Recurrent vomiting   . Hyperlipidemia   . Thyroid disorder     takes sensipar  . Diabetes mellitus     controlling by weight/diet  . 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 GERD (gastroesophageal reflux disease)   . Hypertension   . Hyperthyroidism    Past Surgical  History  Procedure Laterality Date  . Gastric restriction surgery  12/06/09    lap band  . Dialysis fistula creation  2011  . Insertion of dialysis catheter  11/13/2011    Procedure: INSERTION OF DIALYSIS CATHETER;  Surgeon: Sherren Kerns, MD;  Location: [facility] OR;  Service: Vascular;  Laterality: Right;  Insertion of 23cm dialysis catheter in right IJ  . Av fistula placement  12/05/2011    Procedure: ARTERIOVENOUS (AV) FISTULA CREATION;  Surgeon: Larina Earthly, MD;  Location: [facility] OR;  Service: Vascular;  Laterality: Left;  . Av fistula placement  01-23-2012    #1 ligation of 2 competing branches of left upper arm AVF   #2  superifical mobilization of left upper arm AVF    Family History  Problem Relation Age of Onset  . Hypertension Mother   . Diabetes Mother   . Hypertension Father   . Diabetes Father    History  Substance Use Topics  . Smoking status: Never Smoker   . Smokeless tobacco: Never Used  . Alcohol Use: No    Review of Systems  Respiratory: Positive for shortness of breath.   Gastrointestinal: Positive for nausea, vomiting and abdominal pain.  All other systems reviewed and are negative.    Allergies  Review of patient's allergies indicates no known allergies.  Home Medications   Current Outpatient Rx  Name  Route  Sig  Dispense  Refill  . calcium acetate (PHOSLO) 667  MG capsule   Oral   Take 1,334-2,001 mg by mouth 5 (five) times daily. Take 3 capsules with each of 3 daily meals, and 2 capsules with each of 2 daily snacks.         . gabapentin (NEURONTIN) 300 MG capsule   Oral   Take 300-600 mg by mouth 2 (two) times daily. 1 tablet in the morning, 2 tablets in the evening         . HYDROcodone-acetaminophen (NORCO/VICODIN) 5-325 MG per tablet   Oral   Take 1 tablet by mouth every 4 (four) hours as needed for moderate pain.   12 tablet   0   . hydrOXYzine (ATARAX/VISTARIL) 25 MG tablet   Oral   Take 25 mg by mouth every 8 (eight) hours as needed for  itching.          . lubiprostone (AMITIZA) 24 MCG capsule   Oral   Take 1 capsule (24 mcg total) by mouth 2 (two) times daily with a meal.   60 capsule   0   . SENSIPAR 60 MG tablet   Oral   Take 60 mg by mouth at bedtime.           BP 95/46  Pulse 99  Temp(Src) 98 F (36.7 C) (Oral)  Resp 14  SpO2 93% Physical Exam  Nursing note and vitals reviewed. Constitutional: He is oriented to person, place, and time. He appears well-developed and well-nourished. No distress.  HENT:  Head: Normocephalic and atraumatic.  Mouth/Throat: Oropharynx is clear and moist.  Eyes: Conjunctivae are normal.  Neck: Normal range of motion. Neck supple.  Cardiovascular: Normal rate, regular rhythm, normal heart sounds and intact distal pulses.   No extremity edema.  Pulmonary/Chest: Effort normal and breath sounds normal. No respiratory distress. He has no decreased breath sounds. He has no wheezes. He has no rhonchi. He has no rales.  Abdominal: Soft. Bowel sounds are normal. He exhibits distension (mild). There is tenderness in the periumbilical area, left upper quadrant and left lower quadrant. There is guarding. There is no rigidity and no rebound.  Tenderness to very light palpation. Peritoneal dialysis catheter in place on right, no surrounding erythema, discharge or temperature change.  Musculoskeletal: Normal range of motion. He exhibits no edema.  Neurological: He is alert and oriented to person, place, and time.  Skin: Skin is warm and dry. He is not diaphoretic.  Psychiatric: He has a normal mood and affect. His behavior is normal.    ED Course  Procedures (including critical care time) Labs Review Labs Reviewed  CBC - Abnormal; Notable for the following:    WBC 10.9 (*)    All other components within normal limits  COMPREHENSIVE METABOLIC PANEL - Abnormal; Notable for the following:    Chloride 92 (*)    Glucose, Bld 132 (*)    BUN 52 (*)    Creatinine, Ser 16.52 (*)     Calcium 8.1 (*)    Total Protein 8.7 (*)    GFR calc non Af Amer 3 (*)    GFR calc Af Amer 3 (*)    All other components within normal limits  BODY FLUID CELL COUNT WITH DIFFERENTIAL - Abnormal; Notable for the following:    Color, Fluid COLORLESS (*)    Appearance, Fluid CLEAR (*)    All other components within normal limits  BODY FLUID CULTURE  LIPASE, BLOOD  TROPONIN I  LACTIC ACID, PLASMA   Imaging Review Ct Abdomen  Pelvis W Contrast  02/11/2013   CLINICAL DATA:  Abdominal pain.  EXAM: CT ABDOMEN AND PELVIS WITH CONTRAST  TECHNIQUE: Multidetector CT imaging of the abdomen and pelvis was performed using the standard protocol following bolus administration of intravenous contrast.  CONTRAST:  80mL OMNIPAQUE IOHEXOL 300 MG/ML  SOLN  COMPARISON:  CT scan of January 21, 2013.  FINDINGS: No acute abnormality is seen in the visualized lung bases. Status post gastric band procedure which is unchanged compared to prior exam. Status post cholecystectomy. No focal abnormality is seen in the liver, spleen or pancreas. There is seen and increased amount of fluid around the spleen and liver compared to prior exam, which most likely is related to peritoneal dialysis. Peritoneal dialysis catheter is again noted in the left side of the pelvis with free fluid present in this area which is increased compared to prior exam. Small amount of pneumoperitoneum is noted in the epigastric region which was not clearly seen on prior exam. Bilateral renal atrophy is noted and unchanged. Adrenal glands appear normal. No evidence of bowel obstruction is noted. No osseous abnormality is noted.  IMPRESSION: Status post gastric banding procedure and cholecystectomy. Peritoneal dialysis catheter is again noted in the left side of the pelvis which is unchanged compared to prior exam. There is noted an increased amount of fluid in the pelvis as well as around the spleen and liver most likely related to peritoneal dialysis.  There  does appear to be a small amount of pneumoperitoneum present in the epigastric 3 which was not present on prior exam, but most likely is related to peritoneal dialysis.   Electronically Signed   By: Roque Lias M.D.   On: 02/11/2013 13:42   Dg Abd Acute W/chest  02/11/2013   CLINICAL DATA:  Shortness of breath, abdominal pain.  EXAM: ACUTE ABDOMEN SERIES (ABDOMEN 2 VIEW & CHEST 1 VIEW)  COMPARISON:  CT scan and chest radiograph of January 21, 2013.  FINDINGS: There is no evidence of dilated bowel loops. Peritoneal dialysis catheter is seen loops within the left side of the pelvis. Status post gastric band procedure. Status post cholecystectomy. There is noted free air underneath the right he mid which may be related to peritoneal dialysis, although rupture of hollow viscus cannot be excluded. No radiopaque calculi is seen. Heart size and mediastinal contours are within normal limits. Both lungs are clear.  IMPRESSION: No acute cardiopulmonary abnormality seen. No evidence of bowel obstruction or ileus is noted. Small amount of free air is seen underneath the right hemidiaphragm which may be related to peritoneal dialysis, but rupture of hollow viscus cannot be excluded. Johnnette Gourd was notified of this finding by myself personally at 8:25 a.m.   Electronically Signed   By: Roque Lias M.D.   On: 02/11/2013 08:30    EKG Interpretation   None       MDM   1. Abdominal pain   2. Shortness of breath     Pt presenting with abdominal pain, n/v, sob. He is well appearing and in NAD, afebrile with normal VS. Abdomen tender to light palpation, mildly distended. Labs pending. Peritoneal fluid sent for analysis for further eval into possible peritonitis. Will obtain AAS and CT abd/pelvis. Does not make urine. Case discussed with attending Dr. Oletta Lamas who agrees with plan of care. 2:10 PM Ct scan results- Status post gastric banding procedure and cholecystectomy. Peritoneal dialysis catheter is again noted  in the left side of the pelvis which is unchanged  compared to prior exam. There is noted an increased amount of fluid in the pelvis as well as around the spleen and liver most likely related to peritoneal dialysis.  There does appear to be a small amount of pneumoperitoneum present in the epigastric 3 which was not present on prior exam, but most likely is related to peritoneal dialysis. On repeat exam his abdomen is still tender but with clinical improvement. No evidence of peritonitis. He is tolerating PO. Stable for discharge, f/u with PCP. Return precautions given. Patient states understanding of treatment care plan and is agreeable.   Trevor Mace, PA-C 02/11/13 1414

## 2013-02-12 LAB — PATHOLOGIST SMEAR REVIEW

## 2013-02-14 LAB — BODY FLUID CULTURE

## 2013-02-17 ENCOUNTER — Telehealth: Payer: Self-pay | Admitting: *Deleted

## 2013-02-17 NOTE — Telephone Encounter (Signed)
We could see about getting him in with Dr. Bea Laura tomorrow or myself on Thursday.  If he cannot come in then I would suggest Keflex if no allergies.  500 tid time ten days. But he should follow up with [place] soon.

## 2013-02-17 NOTE — Telephone Encounter (Addendum)
Pt states he has a painful crack in his heel, he is diabetic, and lotion and Vaseline are not helping.  I asked pt if he had a drainage from the heel, he denies any.  I advised pt to begin a Neosporin dressing daily, and I will advise Dr Al Corpus and call again.  Pt states his pharmacy - [facility].  Dr Al Corpus states he would prefer pt to be seen in our office, if unable can prescribe an antibiotic.  I offered pt an appt and transferred him to schedulers.

## 2013-02-18 ENCOUNTER — Encounter: Payer: Self-pay | Admitting: Podiatrist

## 2013-02-18 ENCOUNTER — Ambulatory Visit (INDEPENDENT_AMBULATORY_CARE_PROVIDER_SITE_OTHER): Payer: Medicare Other | Admitting: Podiatrist

## 2013-02-18 VITALS — BP 137/74 | HR 74 | Resp 100

## 2013-02-18 DIAGNOSIS — L988 Other specified disorders of the skin and subcutaneous tissue: Secondary | ICD-10-CM

## 2013-02-18 NOTE — Patient Instructions (Signed)
ANTIBACTERIAL SOAP INSTRUCTIONS    Shower as usual. Before getting out, place a drop of antibacterial liquid soap (Dial) on a wet, clean washcloth.  Gently wipe washcloth over affected area.  Afterward, rinse the area with warm water.  Blot the area dry with a soft cloth and cover with antibiotic ointment (neosporin, polysporin, bacitracin) and band aid or gauze and tape  Call if you notice any redness, swelling or drainage from the foot.  Cetaphil Hand Cream is a great cream as well as Eucerin cream for your feet.

## 2013-02-18 NOTE — Progress Notes (Signed)
   Subjective:    Patient ID: Randall [name], male    DOB: 02/16/1966, 47 y.o.   MRN: [id number]  HPI Comments: ''DRY AND CRACK SKIN UNDER THE HEEL ON THE LT FOOT''  Foot Pain This is a new problem. The current episode started 1 to 4 weeks ago. The problem occurs constantly. The problem has been unchanged. The symptoms are aggravated by standing and walking. Treatments tried: NEOSPORIN. The treatment provided no relief.      Review of Systems  All other systems reviewed and are negative.       Objective:   Physical Exam Neurovascular status unchanged.  The patient continues to have neuropathy but he does have sensitivity to the bottoms of his feet bilateral.   Heel fissure present plantar lateral left foot.  Non infected- not draining.  Mild discomfot present.          Assessment & Plan:  Skin fissure left heel  Plan:  Debrided the edges of the lesion and applied dermabond to the fissure as it is superficial and not infected appearing.  Patient is to keep the area clean with antibacterial soap and water and continue applying neosporin.  Instructed to call if any signs of infection are to arise.

## 2013-02-21 ENCOUNTER — Encounter (HOSPITAL_COMMUNITY): Payer: Self-pay | Admitting: Emergency Medicine

## 2013-02-21 ENCOUNTER — Inpatient Hospital Stay (HOSPITAL_COMMUNITY)
Admission: EM | Admit: 2013-02-21 | Discharge: 2013-02-24 | DRG: 682 | Disposition: A | Discharge status: Home / Self Care | Payer: Medicare Other | Attending: Internal Medicine | Admitting: Internal Medicine

## 2013-02-21 ENCOUNTER — Emergency Department (HOSPITAL_COMMUNITY): Payer: Medicare Other

## 2013-02-21 DIAGNOSIS — I120 Hypertensive chronic kidney disease with stage 5 chronic kidney disease or end stage renal disease: Principal | ICD-10-CM | POA: Diagnosis present

## 2013-02-21 DIAGNOSIS — N2581 Secondary hyperparathyroidism of renal origin: Secondary | ICD-10-CM | POA: Diagnosis present

## 2013-02-21 DIAGNOSIS — E872 Acidosis, unspecified: Secondary | ICD-10-CM | POA: Diagnosis present

## 2013-02-21 DIAGNOSIS — N186 End stage renal disease: Secondary | ICD-10-CM

## 2013-02-21 DIAGNOSIS — R109 Unspecified abdominal pain: Secondary | ICD-10-CM

## 2013-02-21 DIAGNOSIS — E669 Obesity, unspecified: Secondary | ICD-10-CM | POA: Diagnosis present

## 2013-02-21 DIAGNOSIS — E0590 Thyrotoxicosis, unspecified without thyrotoxic crisis or storm: Secondary | ICD-10-CM | POA: Diagnosis present

## 2013-02-21 DIAGNOSIS — E119 Type 2 diabetes mellitus without complications: Secondary | ICD-10-CM | POA: Diagnosis present

## 2013-02-21 DIAGNOSIS — R112 Nausea with vomiting, unspecified: Secondary | ICD-10-CM

## 2013-02-21 DIAGNOSIS — Z79899 Other long term (current) drug therapy: Secondary | ICD-10-CM

## 2013-02-21 DIAGNOSIS — G253 Myoclonus: Secondary | ICD-10-CM | POA: Diagnosis present

## 2013-02-21 DIAGNOSIS — M899 Disorder of bone, unspecified: Secondary | ICD-10-CM | POA: Diagnosis present

## 2013-02-21 DIAGNOSIS — R7989 Other specified abnormal findings of blood chemistry: Secondary | ICD-10-CM

## 2013-02-21 DIAGNOSIS — E785 Hyperlipidemia, unspecified: Secondary | ICD-10-CM | POA: Diagnosis present

## 2013-02-21 DIAGNOSIS — Z9884 Bariatric surgery status: Secondary | ICD-10-CM

## 2013-02-21 DIAGNOSIS — Z992 Dependence on renal dialysis: Secondary | ICD-10-CM

## 2013-02-21 DIAGNOSIS — K219 Gastro-esophageal reflux disease without esophagitis: Secondary | ICD-10-CM | POA: Diagnosis present

## 2013-02-21 DIAGNOSIS — I10 Essential (primary) hypertension: Secondary | ICD-10-CM

## 2013-02-21 LAB — CBC WITH DIFFERENTIAL/PLATELET
Basophils Absolute: 0 10*3/uL (ref 0.0–0.1)
Eosinophils Relative: 0 % (ref 0–5)
HCT: 42.6 % (ref 39.0–52.0)
Lymphocytes Relative: 7 % — ABNORMAL LOW (ref 12–46)
Lymphs Abs: 0.7 10*3/uL (ref 0.7–4.0)
MCV: 96.8 fL (ref 78.0–100.0)
Monocytes Relative: 2 % — ABNORMAL LOW (ref 3–12)
Neutro Abs: 8.8 10*3/uL — ABNORMAL HIGH (ref 1.7–7.7)
Platelets: 222 10*3/uL (ref 150–400)
RBC: 4.4 MIL/uL (ref 4.22–5.81)
RDW: 15.7 % — ABNORMAL HIGH (ref 11.5–15.5)
WBC: 9.7 10*3/uL (ref 4.0–10.5)

## 2013-02-21 LAB — COMPREHENSIVE METABOLIC PANEL
ALT: 17 U/L (ref 0–53)
AST: 20 U/L (ref 0–37)
Alkaline Phosphatase: 108 U/L (ref 39–117)
CO2: 22 mEq/L (ref 19–32)
Chloride: 93 mEq/L — ABNORMAL LOW (ref 96–112)
Creatinine, Ser: 17.48 mg/dL — ABNORMAL HIGH (ref 0.50–1.35)
GFR calc Af Amer: 3 mL/min — ABNORMAL LOW (ref >90)
GFR calc non Af Amer: 3 mL/min — ABNORMAL LOW (ref >90)
Glucose, Bld: 166 mg/dL — ABNORMAL HIGH (ref 70–99)
Sodium: 139 mEq/L (ref 135–145)
Total Bilirubin: 0.6 mg/dL (ref 0.3–1.2)

## 2013-02-21 LAB — GLUCOSE, PERITONEAL FLUID: Glucose, Peritoneal Fluid: 1048 mg/dL

## 2013-02-21 LAB — GRAM STAIN: Special Requests: NORMAL

## 2013-02-21 LAB — CG4 I-STAT (LACTIC ACID): Lactic Acid, Venous: 4.08 mmol/L — ABNORMAL HIGH (ref 0.5–2.2)

## 2013-02-21 LAB — GLUCOSE, CAPILLARY
Glucose-Capillary: 141 mg/dL — ABNORMAL HIGH (ref 70–99)
Glucose-Capillary: 111 mg/dL — ABNORMAL HIGH (ref 70–99)

## 2013-02-21 LAB — BODY FLUID CELL COUNT WITH DIFFERENTIAL: Total Nucleated Cell Count, Fluid: 1 cu mm (ref 0–1000)

## 2013-02-21 MED ORDER — INSULIN ASPART 100 UNIT/ML ~~LOC~~ SOLN
0.0000 [IU] | Freq: Three times a day (TID) | SUBCUTANEOUS | Status: DC
  Administered 2013-02-22: 1 [IU] via SUBCUTANEOUS
  Administered 2013-02-24: 3 [IU] via SUBCUTANEOUS

## 2013-02-21 MED ORDER — ACETAMINOPHEN 325 MG PO TABS: 650.0000 mg | ORAL_TABLET | Freq: Four times a day (QID) | ORAL | Status: DC | PRN

## 2013-02-21 MED ORDER — DELFLEX-LC/2.5% DEXTROSE 394 MOSM/L IP SOLN
INTRAPERITONEAL | Status: DC
  Administered 2013-02-21: 18:00:00 via INTRAPERITONEAL

## 2013-02-21 MED ORDER — OXYCODONE HCL 5 MG PO TABS: 5.0000 mg | ORAL_TABLET | ORAL | Status: DC | PRN

## 2013-02-21 MED ORDER — ONDANSETRON HCL 4 MG PO TABS: 4.0000 mg | ORAL_TABLET | Freq: Four times a day (QID) | ORAL | Status: DC | PRN

## 2013-02-21 MED ORDER — ONDANSETRON HCL 4 MG/2ML IJ SOLN
4.0000 mg | Freq: Four times a day (QID) | INTRAMUSCULAR | Status: DC | PRN
  Administered 2013-02-22 – 2013-02-23 (×3): 4 mg via INTRAVENOUS
  Filled 2013-02-21 (×4): qty 2

## 2013-02-21 MED ORDER — SODIUM CHLORIDE 0.9 % IJ SOLN
3.0000 mL | Freq: Two times a day (BID) | INTRAMUSCULAR | Status: DC
  Administered 2013-02-21 – 2013-02-24 (×5): 3 mL via INTRAVENOUS

## 2013-02-21 MED ORDER — ACETAMINOPHEN 650 MG RE SUPP: 650.0000 mg | Freq: Four times a day (QID) | RECTAL | Status: DC | PRN

## 2013-02-21 MED ORDER — HYDROMORPHONE HCL PF 1 MG/ML IJ SOLN
1.0000 mg | Freq: Once | INTRAMUSCULAR | Status: AC
  Administered 2013-02-21: 1 mg via INTRAVENOUS
  Filled 2013-02-21: qty 1

## 2013-02-21 MED ORDER — ONDANSETRON HCL 4 MG/2ML IJ SOLN
4.0000 mg | Freq: Once | INTRAMUSCULAR | Status: AC
  Administered 2013-02-21: 4 mg via INTRAVENOUS
  Filled 2013-02-21: qty 2

## 2013-02-21 MED ORDER — LANTHANUM CARBONATE 500 MG PO CHEW
2000.0000 mg | CHEWABLE_TABLET | Freq: Three times a day (TID) | ORAL | Status: DC
  Administered 2013-02-24: 2000 mg via ORAL
  Filled 2013-02-21 (×11): qty 4

## 2013-02-21 MED ORDER — HEPARIN SODIUM (PORCINE) 5000 UNIT/ML IJ SOLN
5000.0000 [IU] | Freq: Three times a day (TID) | INTRAMUSCULAR | Status: DC
  Administered 2013-02-21 – 2013-02-23 (×7): 5000 [IU] via SUBCUTANEOUS
  Filled 2013-02-21 (×11): qty 1

## 2013-02-21 MED ORDER — HYDROMORPHONE HCL PF 1 MG/ML IJ SOLN
1.0000 mg | INTRAMUSCULAR | Status: DC | PRN
  Administered 2013-02-21 – 2013-02-22 (×3): 1 mg via INTRAVENOUS
  Filled 2013-02-21 (×5): qty 1

## 2013-02-21 NOTE — ED Notes (Signed)
Patient states he started having chest pain/abdominal pain x 2 days ago with shortness of breath.   Patient states he has had increased problems with his SOB.  Patient described chest pain as a 7/10 stabbing pain in central chest.  No radiation reported.

## 2013-02-21 NOTE — ED Notes (Signed)
Admitting MD at bedside.

## 2013-02-21 NOTE — ED Provider Notes (Signed)
CSN: [id number]     Arrival date & time 02/21/13  4098 History   First MD Initiated Contact with Patient 02/21/13 [phone]     Chief Complaint  Patient presents with  . Chest Pain  . Shortness of Breath  . Abdominal Pain   (Consider location/radiation/quality/duration/timing/severity/associated sxs/prior Treatment) HPI Patient presents with nausea, abdominal pain, chest pain, generalized discomfort. Symptoms began 2 days ago. Patient is notable history of peritoneal dialysis.  He last had a session yesterday. Since onset symptoms have been progressive, severe. The pain is sore, diffuse. No new fever, vomiting, diarrhea. No relief with anything.  Past Medical History  Diagnosis Date  . ESRD on hemodialysis     [place] [facility] on MWF, ESRD due to DM/HTN  . Recurrent vomiting   . Hyperlipidemia   . Thyroid disorder     takes sensipar  . Diabetes mellitus     controlling by weight/diet  . 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 GERD (gastroesophageal reflux disease)   . Hypertension   . Hyperthyroidism    Past Surgical History  Procedure Laterality Date  . Gastric restriction surgery  12/06/09    lap band  . Dialysis fistula creation  2011  . Insertion of dialysis catheter  11/13/2011    Procedure: INSERTION OF DIALYSIS CATHETER;  Surgeon: Sherren Kerns, MD;  Location: [facility] OR;  Service: Vascular;  Laterality: Right;  Insertion of 23cm dialysis catheter in right IJ  . Av fistula placement  12/05/2011    Procedure: ARTERIOVENOUS (AV) FISTULA CREATION;  Surgeon: Larina Earthly, MD;  Location: [facility] OR;  Service: Vascular;  Laterality: Left;  . Av fistula placement  01-23-2012    #1 ligation of 2 competing branches of left upper arm AVF   #2  superifical mobilization of left upper arm AVF    Family History  Problem Relation Age of Onset  . Hypertension Mother   . Diabetes Mother   . Hypertension Father   . Diabetes Father    History  Substance Use Topics  . Smoking status: Never  Smoker   . Smokeless tobacco: Never Used  . Alcohol Use: No    Review of Systems  Constitutional:       Per HPI, otherwise negative  HENT:       Per HPI, otherwise negative  Respiratory:       Per HPI, otherwise negative  Cardiovascular:       Per HPI, otherwise negative  Gastrointestinal: Negative for vomiting.  Endocrine:       Negative aside from HPI  Genitourinary:       Neg aside from HPI   Musculoskeletal:       Per HPI, otherwise negative  Skin: Negative.   Neurological: Negative for syncope.    Allergies  Review of patient's allergies indicates no known allergies.  Home Medications   Current Outpatient Rx  Name  Route  Sig  Dispense  Refill  . ACCU-CHEK COMPACT PLUS test strip               . ACCU-CHEK SOFTCLIX LANCETS lancets               . Blood Glucose Monitoring Suppl (ACCU-CHEK COMPACT CARE KIT) KIT               . calcium acetate (PHOSLO) 667 MG capsule   Oral   Take 1,334-2,001 mg by mouth 5 (five) times daily. Take 3 capsules with each of 3 daily  meals, and 2 capsules with each of 2 daily snacks.         . gabapentin (NEURONTIN) 300 MG capsule   Oral   Take 300-600 mg by mouth 2 (two) times daily. 1 tablet in the morning, 2 tablets in the evening         . HYDROcodone-acetaminophen (NORCO/VICODIN) 5-325 MG per tablet   Oral   Take 1 tablet by mouth every 4 (four) hours as needed for moderate pain.   12 tablet   0   . hydrOXYzine (ATARAX/VISTARIL) 25 MG tablet   Oral   Take 25 mg by mouth every 8 (eight) hours as needed for itching.          . lubiprostone (AMITIZA) 24 MCG capsule   Oral   Take 1 capsule (24 mcg total) by mouth 2 (two) times daily with a meal.   60 capsule   0   . SENSIPAR 60 MG tablet   Oral   Take 60 mg by mouth at bedtime.           BP 128/81  Pulse 91  Temp(Src) 97.9 F (36.6 C) (Oral)  Resp 19  Ht 5\' 11"  (1.803 m)  Wt 269 lb 8 oz (122.244 kg)  BMI 37.60 kg/m2  SpO2 100% Physical Exam   Nursing note and vitals reviewed. Constitutional: He is oriented to person, place, and time. He appears well-developed. No distress.  HENT:  Head: Normocephalic and atraumatic.  Eyes: Conjunctivae and EOM are normal.  Cardiovascular: Normal rate and regular rhythm.   Pulmonary/Chest: Effort normal. No stridor. No respiratory distress.  Abdominal: He exhibits no distension.  Large abdomen, distended, with guarding, tenderness to palpation throughout.  The exit site of the peritoneal catheter is clean, dry, intact.  Musculoskeletal: He exhibits no edema.  Neurological: He is alert and oriented to person, place, and time.  Skin: Skin is warm and dry.  Psychiatric: He has a normal mood and affect.    ED Course  Procedures (including critical care time) Labs Review Labs Reviewed  GLUCOSE, CAPILLARY - Abnormal; Notable for the following:    Glucose-Capillary 141 (*)    All other components within normal limits  BODY FLUID CULTURE  GRAM STAIN  CBC WITH DIFFERENTIAL  COMPREHENSIVE METABOLIC PANEL  SYNOVIAL CELL COUNT + DIFF, W/ CRYSTALS  GLUCOSE, SYNOVIAL FLUID   Imaging Review No results found.  EKG Interpretation    Date/Time:  Saturday February 21 2013 08:59:44 EST Ventricular Rate:  104 PR Interval:  180 QRS Duration: 87 QT Interval:  350 QTC Calculation: 460 R Axis:   73 Text Interpretation:  Sinus tachycardia Sinus tachycardia t-waves now upright in lateral leads Abnormal ekg Confirmed by Gerhard Munch  MD ([id number]) on 02/21/2013 9:59:36 AM           Pulse oximetry 97% room air normal   Update: Though the patient's peritoneal fluid was unremarkable, I discussed his case with nephrology team.  A multiple subsequent repeat evaluation, in spite of receiving analgesia, the patient remained in discomfort.  He'll be admitted. MDM   1. Abdominal pain   2. Elevated lactic acid level   3. ESRD (end stage renal disease) on dialysis   4. Hypertension   5. Nausea and  vomiting    Patient presents with multiple concerns.  Notably, the patient's multiple medical problems, prominently end-stage renal disease requiring peritoneal dialysis.  On exam the patient is afebrile, awake alert, but in his comfort.  Patient's evaluation is  notable for lactic acidosis, and persistent tachycardia.  Patient was admitted for further evaluation and management I discussed his case with our nephrology team, hospitalist team.    Gerhard Munch, MD 02/21/13 1650

## 2013-02-21 NOTE — Consult Note (Signed)
[facility] Renal Consultation Note    Indication for Consultation:  Management of ESRD/peritoneal dialysis; anemia, hypertension/volume and secondary hyperparathyroidism  HPI: Randall [name] is a 47 y.o. male  with past medical history significant for hyperlipidemia, DM stated as diet controlled, GERD and obesity s/p Lab Band surgery in 2011 who presented to the ED with complaints of abdominal pain, nausea, and mild constipation for the past two days. He was admitted in late September with similar symptoms as also had an ED visit on 12/3. In both instances, his symptoms resolved with conservative management. He states that his pain today is similar and was not precipitated by consumption of any specific food. Sorbitol and Amitiza taken over the past few days produced only a minimal bowel movement around 3 am this morning. Since the previous admission, he has begun peritoneal dialysis (last outpatient HD10/24/14) and states that treatments have been going well.  His PD catheter exit site is clean/dry; fluid was cultured in the ED was negative for infectious process. Abdominal films are also negative for acute process. However, he will be admitted for work-up of tachycardia and work-up of elevated lactic acid.  Past Medical History  Diagnosis Date  . ESRD on hemodialysis     [facility] on MWF, ESRD due to DM/HTN  . Recurrent vomiting   . Hyperlipidemia   . Thyroid disorder     takes sensipar  . Diabetes mellitus     controlling by weight/diet  . 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 GERD (gastroesophageal reflux disease)   . Hypertension   . Hyperthyroidism    Past Surgical History  Procedure Laterality Date  . Gastric restriction surgery  12/06/09    lap band  . Dialysis fistula creation  2011  . Insertion of dialysis catheter  11/13/2011    Procedure: INSERTION OF DIALYSIS CATHETER;  Surgeon: Sherren Kerns, MD;  Location: [facility] OR;  Service: Vascular;  Laterality:  Right;  Insertion of 23cm dialysis catheter in right IJ  . Av fistula placement  12/05/2011    Procedure: ARTERIOVENOUS (AV) FISTULA CREATION;  Surgeon: Larina Earthly, MD;  Location: [facility] OR;  Service: Vascular;  Laterality: Left;  . Av fistula placement  01-23-2012    #1 ligation of 2 competing branches of left upper arm AVF   #2  superifical mobilization of left upper arm AVF    Family History  Problem Relation Age of Onset  . Hypertension Mother   . Diabetes Mother   . Hypertension Father   . Diabetes Father    Social History:  reports that he has never smoked. He has never used smokeless tobacco. He reports that he does not drink alcohol or use illicit drugs. No Known Allergies Prior to Admission medications   Medication Sig Start Date End Date Taking? Authorizing Provider  calcium acetate (PHOSLO) 667 MG capsule Take 1,334-2,001 mg by mouth 5 (five) times daily. Take 3 capsules with each of 3 daily meals, and 2 capsules with each of 2 daily snacks.   Yes Historical Provider, MD  gabapentin (NEURONTIN) 300 MG capsule Take 300-600 mg by mouth 2 (two) times daily. 1 tablet in the morning, 2 tablets in the evening   Yes Historical Provider, MD  SENSIPAR 60 MG tablet Take 60 mg by mouth at bedtime.  11/27/12  Yes Historical Provider, MD   Current Facility-Administered Medications  Medication Dose Route Frequency Provider Last Rate Last Dose  . acetaminophen (TYLENOL) tablet 650 mg  650 mg Oral Q6H PRN Clydia Llano, MD       Or  . acetaminophen (TYLENOL) suppository 650 mg  650 mg Rectal Q6H PRN Clydia Llano, MD      . dialysis solution 2.5% low-MG/low-CA (DELFLEX) CAPD   Intraperitoneal Continuous Kerin Salen, PA-C      . heparin injection 5,000 Units  5,000 Units Subcutaneous Q8H Clydia Llano, MD      . HYDROmorphone (DILAUDID) injection 1 mg  1 mg Intravenous Q4H PRN Clydia Llano, MD   1 mg at 02/21/13 1604  . insulin aspart (novoLOG) injection 0-9 Units  0-9 Units Subcutaneous TID WC  Clydia Llano, MD      . lanthanum (FOSRENOL) chewable tablet 2,000 mg  2,000 mg Oral TID WC Kerin Salen, PA-C      . ondansetron [facility]) tablet 4 mg  4 mg Oral Q6H PRN Clydia Llano, MD       Or  . ondansetron (ZOFRAN) injection 4 mg  4 mg Intravenous Q6H PRN Clydia Llano, MD      . oxyCODONE (Oxy IR/ROXICODONE) immediate release tablet 5 mg  5 mg Oral Q4H PRN Clydia Llano, MD      . sodium chloride 0.9 % injection 3 mL  3 mL Intravenous Q12H Clydia Llano, MD       Labs: Basic Metabolic Panel:  Recent Labs Lab 02/21/13 0944  NA 139  K 3.6  CL 93*  CO2 22  GLUCOSE 166*  BUN 40*  CREATININE 17.48*  CALCIUM 8.8   Liver Function Tests:  Recent Labs Lab 02/21/13 0944  AST 20  ALT 17  ALKPHOS 108  BILITOT 0.6  PROT 8.8*  ALBUMIN 3.9    CBC:  Recent Labs Lab 02/21/13 0944  WBC 9.7  NEUTROABS 8.8*  HGB 14.2  HCT 42.6  MCV 96.8  PLT 222   CBG:  Recent Labs Lab 02/21/13 0958 02/21/13 1515  GLUCAP 141* 125*   Studies/Results: Dg Abd Acute W/chest  02/21/2013   CLINICAL DATA:  Left lower quadrant pain with recurrent vomiting, cough  EXAM: ACUTE ABDOMEN SERIES (ABDOMEN 2 VIEW & CHEST 1 VIEW)  COMPARISON:  CT abdomen pelvis dated 02/11/2013. Chest/abdominal radiographs dated 02/11/2013.  FINDINGS: Lungs are clear. No pleural effusion or pneumothorax.  The heart is normal in size.  Nonspecific bowel gas pattern, without disproportionate small bowel dilatation to suggest small bowel obstruction.  No evidence of free air under the diaphragm on the upright view.  Lap band in satisfactory position. Cholecystectomy clips. Peritoneal dialysis catheter terminating in the left pelvis.  IMPRESSION: No evidence of acute cardiopulmonary disease.  No evidence of small bowel obstruction or free air.  Postsurgical changes, as above.   Electronically Signed   By: Charline Bills M.D.   On: 02/21/2013 12:06    ROS: Diffuse abdominal pain, nausea, constipation. 10 pt ROS asked and  answered. All systems negative except per HPI above   Physical Exam: Filed Vitals:   02/21/13 1415 02/21/13 1430 02/21/13 1445 02/21/13 1518  BP: 137/76 139/78 142/90 154/94  Pulse: 110 110 113 115  Temp:    98.1 F (36.7 C)  TempSrc:    Oral  Resp: 14 21 20 18   Height:      Weight:      SpO2: 94% 95% 96% 96%     General: Well developed, well nourished, looks uncomfortable Head: Normocephalic, atraumatic, sclera non-icteric, mucus membranes are moist Neck: Supple. JVD not elevated. Lungs: Clear bilaterally to  auscultation without wheezes, rales, or rhonchi. Breathing is unlabored. Heart: Tachy regular with S1 S2. No murmurs, rubs, or gallops appreciated. Abdomen: Soft, tender RUQ and periumbilical region. Distended with normoactive bowel sounds. No rebound/guarding. No obvious abdominal masses. M-S:  Strength and tone appear normal for age. Lower extremities: without edema or ischemic changes, no open wounds  Neuro: Alert and oriented X 3. Moves all extremities spontaneously. Psych:  Responds to questions appropriately with a normal affect. Dialysis Access: PD cath RLQ with clean exit site/ LUA AVF + bruit  PD Orders: EDW 122 kg 7 x week, # exchanges 6, Fill vol 3000cc, dwell 1:30. Last fill 3000cc, # day exchanges 1 Recent labs:  Hgb 13.5, Tsat 34%. P 7.5, PTH 1132 > 244 when on HD and receiving hectorol (investigate this)  Assessment/Plan: 1. Abdominal Pain - Mgmt per primary. Unclear etiology. New to PD, but with recurrent episodes prior to changing dialysis modalities. No leukocytosis or evidence of peritonitis. Blood cultures pending. 2. Elevated lactic acid - Mgmt per primary 3. ESRD -  K 3.6, BUN 40, Cr 17.48. Orders for PD for now with 2.5% soln pending MD eval 4. Hypertension/volume  - SBPs 130s-150s. No BP meds. Close to EDW with no overt signs of volume excess. 5. Anemia  - Hgb > 12. Last Tsat 34%. No op ESAs or Fe. 6. Metabolic bone disease -  Ca 8.8. Last phos 7.5  on Phoslo 3 ac. Pt feels Phoslo contributes to his GI issues. Has been on Fosrenol in the past and is amenable to resuming here. On Sensipar 60 at home which we will hold due to GI upset. Big jump in PTH per op labs, now 1132 up from 244 when he was on hemodialysis and receiving high dose hectorol. Not on calcitriol. Recheck PTH.  7. Nutrition -  Renal diet 8. DM - stated as diet controlled, Last op AlC 6.0 on 01/12/13  Claud Kelp, PA-C [facility] Pager [phone] 02/21/2013, 4:46 PM   I have seen and examined patient, discussed with PA and agree with assessment and plan as outlined above.  ESRD patient admitted with recurrent L sided abd pain, nausea and vomiting.  Unclear cause, cell count not consistent with peritonitis and PD cath exit site is clear.  He started PD 2 mos ago after doing HD for about 2 years.  Recommnedations as above.  Vinson Moselle MD pager [phone]    cell [phone] 02/21/2013, 7:26 PM

## 2013-02-21 NOTE — H&P (Signed)
[facility] History and Physical  HERSHEY KNAUER WUJ:[id number] DOB: [birth year] DOA: 02/21/2013  Referring physician: Jeraldine Loots PCP: [name]., MD   Chief Complaint: Abdominal pain  HPI: Randall [name] is a 47 y.o. male with past medical history of ESRD was on hemodialysis started recently on peritoneal dialysis. Diet-controlled DM and hypertension. Patient came to the hospital because of abdominal pain. Patient said it started about 2 days ago with some nausea and dry heaves but no vomiting. Has generalized abdominal pain, denies fever, chills, denies diarrhea. No recent sick contacts. Patient had similar episode on 12/3 and came in to the ED, CT scan done at that time and showed no abnormalities. In the ED his PD catheter aspirated which showed diffuse account WBC, he does not have leukocytosis, no fever. His lactic acid was found to be 4.0. His creatinine is 17.4.  Review of Systems:  Constitutional: negative for anorexia, fevers and sweats Eyes: negative for irritation, redness and visual disturbance Ears, nose, mouth, throat, and face: negative for earaches, epistaxis, nasal congestion and sore throat Respiratory: negative for cough, dyspnea on exertion, sputum and wheezing Cardiovascular: negative for chest pain, dyspnea, lower extremity edema, orthopnea, palpitations and syncope Gastrointestinal: Generalized abdominal pain and nausea Genitourinary:negative for dysuria, frequency and hematuria Hematologic/lymphatic: negative for bleeding, easy bruising and lymphadenopathy Musculoskeletal:negative for arthralgias, muscle weakness and stiff joints Neurological: negative for coordination problems, gait problems, headaches and weakness Endocrine: negative for diabetic symptoms including polydipsia, polyuria and weight loss Allergic/Immunologic: negative for anaphylaxis, hay fever and urticaria   Past Medical History  Diagnosis Date  . ESRD on hemodialysis     [facility]  on MWF, ESRD due to DM/HTN  . Recurrent vomiting   . Hyperlipidemia   . Thyroid disorder     takes sensipar  . Diabetes mellitus     controlling by weight/diet  . 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 GERD (gastroesophageal reflux disease)   . Hypertension   . Hyperthyroidism    Past Surgical History  Procedure Laterality Date  . Gastric restriction surgery  12/06/09    lap band  . Dialysis fistula creation  2011  . Insertion of dialysis catheter  11/13/2011    Procedure: INSERTION OF DIALYSIS CATHETER;  Surgeon: Sherren Kerns, MD;  Location: [facility] OR;  Service: Vascular;  Laterality: Right;  Insertion of 23cm dialysis catheter in right IJ  . Av fistula placement  12/05/2011    Procedure: ARTERIOVENOUS (AV) FISTULA CREATION;  Surgeon: Larina Earthly, MD;  Location: [facility] OR;  Service: Vascular;  Laterality: Left;  . Av fistula placement  01-23-2012    #1 ligation of 2 competing branches of left upper arm AVF   #2  superifical mobilization of left upper arm AVF    Social History:  reports that he has never smoked. He has never used smokeless tobacco. He reports that he does not drink alcohol or use illicit drugs.  No Known Allergies  Family History  Problem Relation Age of Onset  . Hypertension Mother   . Diabetes Mother   . Hypertension Father   . Diabetes Father      Prior to Admission medications   Medication Sig Start Date End Date Taking? Authorizing Provider  calcium acetate (PHOSLO) 667 MG capsule Take 1,334-2,001 mg by mouth 5 (five) times daily. Take 3 capsules with each of 3 daily meals, and 2 capsules with each of 2 daily snacks.   Yes Historical Provider, MD  gabapentin (NEURONTIN)  300 MG capsule Take 300-600 mg by mouth 2 (two) times daily. 1 tablet in the morning, 2 tablets in the evening   Yes Historical Provider, MD  SENSIPAR 60 MG tablet Take 60 mg by mouth at bedtime.  11/27/12  Yes Historical Provider, MD   Physical Exam:  BP 154/94  Pulse 115  Temp(Src)  98.1 F (36.7 C) (Oral)  Resp 18  Ht 5\' 11"  (1.803 m)  Wt 122.244 kg (269 lb 8 oz)  BMI 37.60 kg/m2  SpO2 96% General appearance: alert, cooperative and no distress, obese African American male  Head: Normocephalic, without obvious abnormality, atraumatic  Eyes: conjunctivae/corneas clear. PERRL, EOM's intact. Fundi benign.  Nose: Nares normal. Septum midline. Mucosa normal. No drainage or sinus tenderness.  Throat: lips, mucosa, and tongue normal; teeth and gums normal  Neck: Supple, no masses, no cervical lymphadenopathy, no JVD appreciated, no meningeal signs Resp: clear to auscultation bilaterally  Chest wall: no tenderness  Cardio: regular rate and rhythm, S1, S2 normal, no murmur, click, rub or gallop  GI: soft, mild generalized tenderness worse on left lower quadrant; bowel sounds normal; no masses, no organomegaly, PD catheter identified on the right flank Extremities: extremities normal, atraumatic, no cyanosis or edema  Skin: Skin color, texture, turgor normal. No rashes or lesions  Neurologic: Alert and oriented X 3, normal strength and tone. Normal symmetric reflexes. Normal coordination and gait   labs on Admission:  Basic Metabolic Panel:  Recent Labs Lab 02/21/13 0944  NA 139  K 3.6  CL 93*  CO2 22  GLUCOSE 166*  BUN 40*  CREATININE 17.48*  CALCIUM 8.8   Liver Function Tests:  Recent Labs Lab 02/21/13 0944  AST 20  ALT 17  ALKPHOS 108  BILITOT 0.6  PROT 8.8*  ALBUMIN 3.9   No results found for this basename: LIPASE, AMYLASE,  in the last 168 hours No results found for this basename: AMMONIA,  in the last 168 hours CBC:  Recent Labs Lab 02/21/13 0944  WBC 9.7  NEUTROABS 8.8*  HGB 14.2  HCT 42.6  MCV 96.8  PLT 222   Cardiac Enzymes: No results found for this basename: CKTOTAL, CKMB, CKMBINDEX, TROPONINI,  in the last 168 hours  BNP (last 3 results) No results found for this basename: PROBNP,  in the last 8760 hours CBG:  Recent  Labs Lab 02/21/13 0958 02/21/13 1515  GLUCAP 141* 125*    Radiological Exams on Admission: Dg Abd Acute W/chest  02/21/2013   CLINICAL DATA:  Left lower quadrant pain with recurrent vomiting, cough  EXAM: ACUTE ABDOMEN SERIES (ABDOMEN 2 VIEW & CHEST 1 VIEW)  COMPARISON:  CT abdomen pelvis dated 02/11/2013. Chest/abdominal radiographs dated 02/11/2013.  FINDINGS: Lungs are clear. No pleural effusion or pneumothorax.  The heart is normal in size.  Nonspecific bowel gas pattern, without disproportionate small bowel dilatation to suggest small bowel obstruction.  No evidence of free air under the diaphragm on the upright view.  Lap band in satisfactory position. Cholecystectomy clips. Peritoneal dialysis catheter terminating in the left pelvis.  IMPRESSION: No evidence of acute cardiopulmonary disease.  No evidence of small bowel obstruction or free air.  Postsurgical changes, as above.   Electronically Signed   By: Charline Bills M.D.   On: 02/21/2013 12:06    EKG: Independently reviewed.   Assessment/Plan Active Problems:   ESRD (end stage renal disease) on dialysis   Lapband APL over bypass Sept 2011   Hypertension   Abdominal pain  Elevated lactic acid level   Abdominal pain -Unclear etiology, per peritoneal fluids, absence of fever and leukocytosis no evidence of peritonitis. -Patient had recurrent episodes, he reported he had this even prior to initiation of peritoneal dialysis. -Unsure if it's related to his lapband surgery. -No leukocytosis but there is neutrophilia, blood cultures obtained, and low threshold to start antibiotics/re-CT.  ESRD -Patient was on hemodialysis, was recently started on peritoneal dialysis. -Nephrology notified, likely to be hemodialyzed again.  -No evidence of pulmonary edema or marked fluid overload.  Elevated lactic acid -This is likely secondary to his end-stage renal disease and inability to clear lactic acid. -Has mild abdominal tenderness,  not clinically supporting presence of occlusive mesenteric ischemia. -No fever, leukocytosis or evidence of infection. Low threshold to start antibiotics, blood cultures obtained.  Hypertension -Restart her medications.  Diabetes mellitus type 2 -Diet controlled, start insulin sliding scale. -Check hemoglobin A1c.   Code Status:  Full code Family Communication: plan discussed with the patient Disposition Plan:  Inpatient, anticipate length of stay to be greater than 2 midnights  Time spent:  70 minutes  [facility] Pager [phone]

## 2013-02-21 NOTE — ED Notes (Signed)
Patient transported to X-ray 

## 2013-02-21 NOTE — Progress Notes (Signed)
02/21/2013  Patient transfer from the emergency room to 6east at 1630. He is alert, oriented and ambulatory. When arrived on unit he did c/o abdominal pain and was eventually given pain medication. Patient skin is intact and dry. He does have a PD cath in the right lower quad and left upper arm fistula which is positive. The Pd cath is clean and dry. He was place on telemetry and was doing sinus tach. [facility] RN.

## 2013-02-22 LAB — GLUCOSE, CAPILLARY
Glucose-Capillary: 131 mg/dL — ABNORMAL HIGH (ref 70–99)
Glucose-Capillary: 112 mg/dL — ABNORMAL HIGH (ref 70–99)
Glucose-Capillary: 134 mg/dL — ABNORMAL HIGH (ref 70–99)

## 2013-02-22 LAB — BASIC METABOLIC PANEL
CO2: 26 mEq/L (ref 19–32)
Calcium: 7.9 mg/dL — ABNORMAL LOW (ref 8.4–10.5)
Chloride: 93 mEq/L — ABNORMAL LOW (ref 96–112)
Creatinine, Ser: 18.36 mg/dL — ABNORMAL HIGH (ref 0.50–1.35)
Glucose, Bld: 102 mg/dL — ABNORMAL HIGH (ref 70–99)
Sodium: 139 mEq/L (ref 135–145)

## 2013-02-22 LAB — CBC
HCT: 40.8 % (ref 39.0–52.0)
MCH: 32.7 pg (ref 26.0–34.0)
MCHC: 32.8 g/dL (ref 30.0–36.0)
MCV: 99.5 fL (ref 78.0–100.0)
Platelets: 213 10*3/uL (ref 150–400)
RDW: 16 % — ABNORMAL HIGH (ref 11.5–15.5)
WBC: 10.6 10*3/uL — ABNORMAL HIGH (ref 4.0–10.5)

## 2013-02-22 LAB — TSH: TSH: 3.428 u[IU]/mL (ref 0.350–4.500)

## 2013-02-22 LAB — LACTIC ACID, PLASMA: Lactic Acid, Venous: 2 mmol/L (ref 0.5–2.2)

## 2013-02-22 LAB — HEMOGLOBIN A1C
Hgb A1c MFr Bld: 5.9 % — ABNORMAL HIGH (ref <5.7)
Mean Plasma Glucose: 123 mg/dL — ABNORMAL HIGH (ref <117)

## 2013-02-22 LAB — PROCALCITONIN: Procalcitonin: 0.57 ng/mL

## 2013-02-22 MED ORDER — HEPARIN SODIUM (PORCINE) 1000 UNIT/ML IJ SOLN
1500.0000 [IU] | INTRAMUSCULAR | Status: DC
  Administered 2013-02-22: 1500 [IU] via INTRAPERITONEAL
  Filled 2013-02-22 (×3): qty 1.5

## 2013-02-22 MED ORDER — HYDROMORPHONE HCL PF 1 MG/ML IJ SOLN
1.0000 mg | INTRAMUSCULAR | Status: DC | PRN
  Administered 2013-02-22 – 2013-02-24 (×8): 1 mg via INTRAVENOUS
  Filled 2013-02-22 (×7): qty 1

## 2013-02-22 MED ORDER — PROMETHAZINE HCL 25 MG/ML IJ SOLN
12.5000 mg | Freq: Four times a day (QID) | INTRAMUSCULAR | Status: DC | PRN
  Administered 2013-02-22 – 2013-02-23 (×2): 12.5 mg via INTRAVENOUS
  Filled 2013-02-22 (×2): qty 1

## 2013-02-22 MED ORDER — DELFLEX-LC/2.5% DEXTROSE 394 MOSM/L IP SOLN
INTRAPERITONEAL | Status: DC
  Administered 2013-02-22: 3000 L via INTRAPERITONEAL
  Administered 2013-02-22: 3000 mL via INTRAPERITONEAL

## 2013-02-22 MED ORDER — HEPARIN SODIUM (PORCINE) 1000 UNIT/ML IJ SOLN: 500.0000 [IU] | INTRAMUSCULAR | Status: DC

## 2013-02-22 NOTE — Progress Notes (Signed)
[facility] PROGRESS NOTE  Randall [name] ZOX:[id number] DOB: 12/10/1965 DOA: 02/21/2013 PCP: [name]., MD  Assessment/Plan: Abdominal pain  -Unclear etiology, absence of fever and leukocytosis no evidence of peritonitis.  -Patient had recurrent episodes,.   -Unsure if it's related to his lapband surgery.  -No leukocytosis but there is neutrophilia, blood cultures obtained, and low threshold to start antibiotics/re-CT.  - could be secondary to PD plan for HD in am  And Tuesday and see if symptoms improve as per renal recommendations.  ESRD  -Patient was on hemodialysis, was recently started on peritoneal dialysis.  -Nephrology notified, likely to be hemodialyzed again.  -No evidence of pulmonary edema or marked fluid overload.  Elevated lactic acid  -This is likely secondary to his end-stage renal disease and inability to clear lactic acid.  -Has mild abdominal tenderness, not clinically supporting presence of occlusive mesenteric ischemia.  -No fever, leukocytosis or evidence of infection. Low threshold to start antibiotics, blood cultures obtained.  - repeat level is within normal limits.  Hypertension  -Restart her medications.  Diabetes mellitus type 2  -Diet controlled, start insulin sliding scale.  CBG (last 3)   Recent Labs  02/21/13 1632 02/21/13 2204 02/22/13 0747  GLUCAP 111* 121* 106*     - hemoglobin A1c is 5.9  DVT prophylaxis.  Code Status: Full code  Family Communication: plan discussed with the patient and son at bedside.  Disposition Plan: Inpatient, possibly Wednesday.       Consultants:  renal  Procedures:  HD in am  Antibiotics:  none  HPI/Subjective: Pain is 3 /10, comfortable. In good spirits.   Objective: Filed Vitals:   02/22/13 0536  BP: 95/57  Pulse: 94  Temp: 97.5 F (36.4 C)  Resp: 18   No intake or output data in the 24 hours ending 02/22/13 1133 Filed Weights   02/21/13 0856 02/21/13 2245 02/22/13  0700  Weight: 122.244 kg (269 lb 8 oz) 123.514 kg (272 lb 4.8 oz) 119.8 kg (264 lb 1.8 oz)    Exam:   General:  Alert afebrile comfortable  Cardiovascular: s1s2  Respiratory: ctab  Abdomen: soft mild tenderness int he epigastric area,  ND BS+  Musculoskeletal: TRACE EDEMA  Data Reviewed: Basic Metabolic Panel:  Recent Labs Lab 02/21/13 0944 02/22/13 0520  NA 139 139  K 3.6 3.5  CL 93* 93*  CO2 22 26  GLUCOSE 166* 102*  BUN 40* 45*  CREATININE 17.48* 18.36*  CALCIUM 8.8 7.9*   Liver Function Tests:  Recent Labs Lab 02/21/13 0944  AST 20  ALT 17  ALKPHOS 108  BILITOT 0.6  PROT 8.8*  ALBUMIN 3.9   No results found for this basename: LIPASE, AMYLASE,  in the last 168 hours No results found for this basename: AMMONIA,  in the last 168 hours CBC:  Recent Labs Lab 02/21/13 0944 02/22/13 0520  WBC 9.7 10.6*  NEUTROABS 8.8*  --   HGB 14.2 13.4  HCT 42.6 40.8  MCV 96.8 99.5  PLT 222 213   Cardiac Enzymes: No results found for this basename: CKTOTAL, CKMB, CKMBINDEX, TROPONINI,  in the last 168 hours BNP (last 3 results) No results found for this basename: PROBNP,  in the last 8760 hours CBG:  Recent Labs Lab 02/21/13 0958 02/21/13 1515 02/21/13 1632 02/21/13 2204 02/22/13 0747  GLUCAP 141* 125* 111* 121* 106*    Recent Results (from the past 240 hour(s))  BODY FLUID CULTURE     Status: None  Collection Time    02/21/13 10:25 AM      Result Value Range Status   Specimen Description PERITONEAL CAVITY   Final   Special Requests Normal   Final   Gram Stain     Final   Value: Performed at [facility] CYTOSPIN WBC PRESENT,BOTH PMN AND MONONUCLEAR     NO ORGANISMS SEEN     Performed at [facility]   Culture PENDING   Incomplete   Report Status PENDING   Incomplete  GRAM STAIN     Status: None   Collection Time    02/21/13 10:25 AM      Result Value Range Status   Specimen Description PERITONEAL CAVITY   Final   Special  Requests Normal   Final   Gram Stain     Final   Value: WBC PRESENT,BOTH PMN AND MONONUCLEAR     NO ORGANISMS SEEN     CYTOSPIN SLIDE   Report Status 02/21/2013 FINAL   Final  MRSA PCR SCREENING     Status: None   Collection Time    02/21/13  5:18 PM      Result Value Range Status   MRSA by PCR NEGATIVE  NEGATIVE Final   Comment:            The GeneXpert MRSA Assay (FDA     approved for NASAL specimens     only), is one component of a     comprehensive MRSA colonization     surveillance program. It is not     intended to diagnose MRSA     infection nor to guide or     monitor treatment for     MRSA infections.     Studies: Dg Abd Acute W/chest  02/21/2013   CLINICAL DATA:  Left lower quadrant pain with recurrent vomiting, cough  EXAM: ACUTE ABDOMEN SERIES (ABDOMEN 2 VIEW & CHEST 1 VIEW)  COMPARISON:  CT abdomen pelvis dated 02/11/2013. Chest/abdominal radiographs dated 02/11/2013.  FINDINGS: Lungs are clear. No pleural effusion or pneumothorax.  The heart is normal in size.  Nonspecific bowel gas pattern, without disproportionate small bowel dilatation to suggest small bowel obstruction.  No evidence of free air under the diaphragm on the upright view.  Lap band in satisfactory position. Cholecystectomy clips. Peritoneal dialysis catheter terminating in the left pelvis.  IMPRESSION: No evidence of acute cardiopulmonary disease.  No evidence of small bowel obstruction or free air.  Postsurgical changes, as above.   Electronically Signed   By: Charline Bills M.D.   On: 02/21/2013 12:06    Scheduled Meds: . heparin  5,000 Units Subcutaneous Q8H  . insulin aspart  0-9 Units Subcutaneous TID WC  . lanthanum  2,000 mg Oral TID WC  . sodium chloride  3 mL Intravenous Q12H   Continuous Infusions: . dialysis solution 2.5% low-MG/low-CA      Active Problems:   ESRD (end stage renal disease) on dialysis   Lapband APL over bypass Sept 2011   Hypertension   Abdominal pain   Elevated  lactic acid level    Time spent: 25 min    Melisia Leming  [facility] Pager [phone]. If 7PM-7AM, please contact night-coverage at [email or web address], password [facility] 02/22/2013, 11:33 AM  LOS: 1 day

## 2013-02-22 NOTE — Progress Notes (Signed)
  [facility] Progress Note   Subjective: Creat 18 today, it was 6-10 range in Jan while on HD but since September has been in 15-22 range. Still severe nausea, wont' eat for fear of dry heaving. Endorses myoclonic jerking also at home the last couple of weeks  Exam  Blood pressure 95/57, pulse 94, temperature 97.5 F (36.4 C), temperature source Oral, resp. rate 18, height 5\' 11"  (1.803 m), weight 119.8 kg (264 lb 1.8 oz), SpO2 95.00%.  gen: alert, no distress  skin: no rash, cyanosis  heent: eomi, sclera anicteric, throat clear  neck: no jvd, no lan  chest: clear bilat  cor: regular, no M or rub, pedal pulses intact  abd: soft, obese, RUQ cath exit site clean, no ascites  ext: no LE or UE edema, no joint effusion, no gangrene/ulcers  neuro: alert, ox3, nonfocal, no asterixis  access: LUA AVF patent  PD Orders: EDW 122 kg  7 x week, # exchanges 6, Fill vol 3000cc, dwell 1:30.  Last fill 3000cc, # day exchanges 1  Recent labs: Hgb 13.5, Tsat 34%. P 7.5, PTH 1132 > 244 when on HD and receiving hectorol (investigate this)   Assessment/Plan:  1. Nausea / vomiting / abd pain / anorexia: patient showing signs of underdialysis after switching to PD 2 months ago (GI symptoms, myoclonic jerking, fatigue, etc); he is reluctant to revert to HD but will do it if there are no other options. He says he is doing all his PD as prescribed at home.  Will d/w options with pt's primary neph tomorrow. Plan HD tomorrow and Tuesday to see if symptoms improve.  2. ESRD: PD tonight, then hold and do HD Mon and Tuesday 3. Hypertension/volume - SBPs 130s-150s, no bp meds, close to dry wt 4. Anemia - Hgb > 12. Last Tsat 34%. No op ESAs or Fe. 5. Metabolic bone disease / CKD: holding sensipar with GI problems, changed phoslo to Fosrenol as binder, pt request.  Big jump in PTH per op labs, now 1132 up from 244 when he was on hemodialysis and receiving high dose hectorol. Not on calcitriol. Recheck PTH.   6. Nutrition - Renal diet 7. DM - stated as diet controlled, Last op AlC 6.0 on 01/12/13   Vinson Moselle MD  pager [phone]    cell [phone]  02/22/2013, 10:59 AM     Recent Labs Lab 02/21/13 0944 02/22/13 0520  NA 139 139  K 3.6 3.5  CL 93* 93*  CO2 22 26  GLUCOSE 166* 102*  BUN 40* 45*  CREATININE 17.48* 18.36*  CALCIUM 8.8 7.9*    Recent Labs Lab 02/21/13 0944  AST 20  ALT 17  ALKPHOS 108  BILITOT 0.6  PROT 8.8*  ALBUMIN 3.9    Recent Labs Lab 02/21/13 0944 02/22/13 0520  WBC 9.7 10.6*  NEUTROABS 8.8*  --   HGB 14.2 13.4  HCT 42.6 40.8  MCV 96.8 99.5  PLT 222 213   . heparin  5,000 Units Subcutaneous Q8H  . insulin aspart  0-9 Units Subcutaneous TID WC  . lanthanum  2,000 mg Oral TID WC  . sodium chloride  3 mL Intravenous Q12H   . dialysis solution 2.5% low-MG/low-CA     acetaminophen, acetaminophen, HYDROmorphone (DILAUDID) injection, ondansetron (ZOFRAN) IV, ondansetron, oxyCODONE

## 2013-02-23 LAB — RENAL FUNCTION PANEL
BUN: 49 mg/dL — ABNORMAL HIGH (ref 6–23)
Calcium: 8.1 mg/dL — ABNORMAL LOW (ref 8.4–10.5)
Glucose, Bld: 88 mg/dL (ref 70–99)
Phosphorus: 8.7 mg/dL — ABNORMAL HIGH (ref 2.3–4.6)

## 2013-02-23 LAB — PATHOLOGIST SMEAR REVIEW: Path Review: REACTIVE

## 2013-02-23 LAB — CBC
HCT: 40.7 % (ref 39.0–52.0)
Hemoglobin: 13.6 g/dL (ref 13.0–17.0)
MCH: 33 pg (ref 26.0–34.0)
MCHC: 33.4 g/dL (ref 30.0–36.0)
MCV: 98.8 fL (ref 78.0–100.0)
Platelets: 214 10*3/uL (ref 150–400)

## 2013-02-23 LAB — GLUCOSE, CAPILLARY
Glucose-Capillary: 111 mg/dL — ABNORMAL HIGH (ref 70–99)
Glucose-Capillary: 116 mg/dL — ABNORMAL HIGH (ref 70–99)
Glucose-Capillary: 118 mg/dL — ABNORMAL HIGH (ref 70–99)

## 2013-02-23 MED ORDER — SODIUM CHLORIDE 0.9 % IV SOLN: 100.0000 mL | INTRAVENOUS | Status: DC | PRN

## 2013-02-23 MED ORDER — LIDOCAINE-PRILOCAINE 2.5-2.5 % EX CREA
1.0000 "application " | TOPICAL_CREAM | CUTANEOUS | Status: DC | PRN
  Filled 2013-02-23: qty 5

## 2013-02-23 MED ORDER — NEPRO/CARBSTEADY PO LIQD: 237.0000 mL | ORAL | Status: DC | PRN

## 2013-02-23 MED ORDER — LIDOCAINE HCL (PF) 1 % IJ SOLN: 5.0000 mL | INTRAMUSCULAR | Status: DC | PRN

## 2013-02-23 MED ORDER — HEPARIN SODIUM (PORCINE) 1000 UNIT/ML DIALYSIS: 1000.0000 [IU] | INTRAMUSCULAR | Status: DC | PRN

## 2013-02-23 MED ORDER — HEPARIN SODIUM (PORCINE) 1000 UNIT/ML IJ SOLN
3000.0000 [IU] | Freq: Once | INTRAMUSCULAR | Status: AC
  Administered 2013-02-23: 3000 [IU] via INTRAVENOUS

## 2013-02-23 MED ORDER — HEPARIN SODIUM (PORCINE) 1000 UNIT/ML DIALYSIS: 2000.0000 [IU] | INTRAMUSCULAR | Status: DC | PRN

## 2013-02-23 MED ORDER — ALTEPLASE 2 MG IJ SOLR: 2.0000 mg | Freq: Once | INTRAMUSCULAR | Status: DC | PRN

## 2013-02-23 MED ORDER — HEPARIN SODIUM (PORCINE) 1000 UNIT/ML DIALYSIS: 3000.0000 [IU] | INTRAMUSCULAR | Status: DC | PRN

## 2013-02-23 MED ORDER — PENTAFLUOROPROP-TETRAFLUOROETH EX AERO: 1.0000 "application " | INHALATION_SPRAY | CUTANEOUS | Status: DC | PRN

## 2013-02-23 MED ORDER — LIDOCAINE-PRILOCAINE 2.5-2.5 % EX CREA: 1.0000 "application " | TOPICAL_CREAM | CUTANEOUS | Status: DC | PRN

## 2013-02-23 MED ORDER — POTASSIUM CHLORIDE CRYS ER 20 MEQ PO TBCR
20.0000 meq | EXTENDED_RELEASE_TABLET | Freq: Once | ORAL | Status: AC
  Administered 2013-02-23: 20 meq via ORAL
  Filled 2013-02-23: qty 1

## 2013-02-23 NOTE — Progress Notes (Signed)
Pt alert and oriented in no acute distress requesting for an signed off d/t not feeling well. PA made aware and approved for an early termination of HD against medical advice with approximately 22 mins left of tx.  Document signed and witnessed by staff. Rinsed back, cleaned pt post bedpan used. And rolled pt back to 6E with side rails up. Reports given to [employer].

## 2013-02-23 NOTE — Progress Notes (Signed)
[facility] PROGRESS NOTE  Randall [name] ION:[id number] DOB: 1965-12-23 DOA: 02/21/2013 PCP: [name]., MD  Assessment/Plan: Abdominal pain  -  More post prandial. -Unclear etiology, absence of fever and leukocytosis no evidence of peritonitis.  -Patient had recurrent episodes,.   -Unsure if it's related to his lapband surgery.  -No leukocytosis but there is neutrophilia, blood cultures obtained, and low threshold to start antibiotics. - could be secondary to PD plan for HD in am  And Tuesday and see if symptoms improve as per renal recommendations.  - pt reports pain reoccured, we will go ahead get CT abdomen and pelvis ESRD  -Patient was on hemodialysis, was recently started on peritoneal dialysis.  -Nephrology notified, likely to be hemodialyzed again.  -No evidence of pulmonary edema or marked fluid overload.  Elevated lactic acid  -This is likely secondary to his end-stage renal disease and inability to clear lactic acid.  -Has mild abdominal tenderness, not clinically supporting presence of occlusive mesenteric ischemia.  -No fever, leukocytosis or evidence of infection. Low threshold to start antibiotics, blood cultures obtained.  - repeat level is within normal limits.  Hypertension  -Restart her medications.  Diabetes mellitus type 2  -Diet controlled, start insulin sliding scale.  CBG (last 3)   Recent Labs  02/22/13 2054 02/23/13 1305 02/23/13 1649  GLUCAP 134* 111* 116*     - hemoglobin A1c is 5.9  DVT prophylaxis.  Code Status: Full code  Family Communication: plan discussed with the patient and son at bedside.  Disposition Plan: Inpatient, possibly Wednesday.       Consultants:  renal  Procedures:  HD in am  Antibiotics:  none  HPI/Subjective: Pain is 3 /10, comfortable. In good spirits.   Objective: Filed Vitals:   02/23/13 1652  BP: 149/80  Pulse: 117  Temp: 97.6 F (36.4 C)  Resp: 20    Intake/Output Summary  (Last 24 hours) at 02/23/13 1731 Last data filed at 02/23/13 1308  Gross per 24 hour  Intake    180 ml  Output    -38 ml  Net    218 ml   Filed Weights   02/22/13 2057 02/23/13 0716 02/23/13 1140  Weight: 121.519 kg (267 lb 14.4 oz) 109.5 kg (241 lb 6.5 oz) 110 kg (242 lb 8.1 oz)    Exam:   General:  Alert afebrile comfortable  Cardiovascular: s1s2  Respiratory: ctab  Abdomen: soft mild tenderness int he epigastric area,  ND BS+  Musculoskeletal: TRACE EDEMA  Data Reviewed: Basic Metabolic Panel:  Recent Labs Lab 02/21/13 0944 02/22/13 0520 02/23/13 0727  NA 139 139 139  K 3.6 3.5 3.1*  CL 93* 93* 93*  CO2 22 26 23   GLUCOSE 166* 102* 88  BUN 40* 45* 49*  CREATININE 17.48* 18.36* 19.22*  CALCIUM 8.8 7.9* 8.1*  PHOS  --   --  8.7*   Liver Function Tests:  Recent Labs Lab 02/21/13 0944 02/23/13 0727  AST 20  --   ALT 17  --   ALKPHOS 108  --   BILITOT 0.6  --   PROT 8.8*  --   ALBUMIN 3.9 3.4*   No results found for this basename: LIPASE, AMYLASE,  in the last 168 hours No results found for this basename: AMMONIA,  in the last 168 hours CBC:  Recent Labs Lab 02/21/13 0944 02/22/13 0520 02/23/13 0726  WBC 9.7 10.6* 10.0  NEUTROABS 8.8*  --   --   HGB 14.2 13.4 13.6  HCT 42.6 40.8 40.7  MCV 96.8 99.5 98.8  PLT 222 213 214   Cardiac Enzymes: No results found for this basename: CKTOTAL, CKMB, CKMBINDEX, TROPONINI,  in the last 168 hours BNP (last 3 results) No results found for this basename: PROBNP,  in the last 8760 hours CBG:  Recent Labs Lab 02/22/13 1142 02/22/13 1629 02/22/13 2054 02/23/13 1305 02/23/13 1649  GLUCAP 131* 112* 134* 111* 116*    Recent Results (from the past 240 hour(s))  BODY FLUID CULTURE     Status: None   Collection Time    02/21/13 10:25 AM      Result Value Range Status   Specimen Description PERITONEAL CAVITY   Final   Special Requests Normal   Final   Gram Stain     Final   Value: Performed at [facility] CYTOSPIN WBC PRESENT,BOTH PMN AND MONONUCLEAR     NO ORGANISMS SEEN     Performed at [facility]   Culture     Final   Value: NO GROWTH 2 DAYS     Performed at [facility]   Report Status PENDING   Incomplete  GRAM STAIN     Status: None   Collection Time    02/21/13 10:25 AM      Result Value Range Status   Specimen Description PERITONEAL CAVITY   Final   Special Requests Normal   Final   Gram Stain     Final   Value: WBC PRESENT,BOTH PMN AND MONONUCLEAR     NO ORGANISMS SEEN     CYTOSPIN SLIDE   Report Status 02/21/2013 FINAL   Final  MRSA PCR SCREENING     Status: None   Collection Time    02/21/13  5:18 PM      Result Value Range Status   MRSA by PCR NEGATIVE  NEGATIVE Final   Comment:            The GeneXpert MRSA Assay (FDA     approved for NASAL specimens     only), is one component of a     comprehensive MRSA colonization     surveillance program. It is not     intended to diagnose MRSA     infection nor to guide or     monitor treatment for     MRSA infections.  CULTURE, BLOOD (ROUTINE X 2)     Status: None   Collection Time    02/21/13  7:15 PM      Result Value Range Status   Specimen Description BLOOD RIGHT ARM   Final   Special Requests BOTTLES DRAWN AEROBIC AND ANAEROBIC 10CC EA   Final   Culture  Setup Time     Final   Value: 02/22/2013 02:28     Performed at [facility]   Culture     Final   Value:        BLOOD CULTURE RECEIVED NO GROWTH TO DATE CULTURE WILL BE HELD FOR 5 DAYS BEFORE ISSUING A FINAL NEGATIVE REPORT     Performed at [facility]   Report Status PENDING   Incomplete  CULTURE, BLOOD (ROUTINE X 2)     Status: None   Collection Time    02/21/13  7:27 PM      Result Value Range Status   Specimen Description BLOOD RIGHT HAND   Final   Special Requests BOTTLES DRAWN AEROBIC ONLY 4CC   Final   Culture  Setup Time     Final   Value: 02/22/2013 02:28     Performed at [facility]    Culture     Final   Value:        BLOOD CULTURE RECEIVED NO GROWTH TO DATE CULTURE WILL BE HELD FOR 5 DAYS BEFORE ISSUING A FINAL NEGATIVE REPORT     Performed at [facility]   Report Status PENDING   Incomplete     Studies: No results found.  Scheduled Meds: . heparin  5,000 Units Subcutaneous Q8H  . insulin aspart  0-9 Units Subcutaneous TID WC  . lanthanum  2,000 mg Oral TID WC  . sodium chloride  3 mL Intravenous Q12H   Continuous Infusions: . dialysis solution 2.5% low-MG/low-CA    . 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 If 7PM-7AM, please contact night-coverage at [email or web address], password [facility] 02/23/2013, 5:31 PM  LOS: 2 days

## 2013-02-23 NOTE — Progress Notes (Signed)
  [facility] Progress Note   Subjective: Notes some jerking of arms last night again.  Nausea a little better, had a BM, has not tried to eat  Exam  Blood pressure 108/71, pulse 71, temperature 98.2 F (36.8 C), temperature source Oral, resp. rate 20, height 5\' 11"  (1.803 m), weight 109.5 kg (241 lb 6.5 oz), SpO2 94.00%.  gen: alert, no distress  skin: no rash, cyanosis  heent: eomi, sclera anicteric, throat clear  neck: no jvd, no lan  chest: clear bilat  cor: regular, no M or rub, pedal pulses intact  abd: soft, obese, RUQ cath exit site clean, no ascites  ext: no LE or UE edema  neuro: alert, ox3, nonfocal, no asterixis  access: LUA AVF patent and LFA AVF also patent  PD Orders: EDW 122 kg  7 x week, # exchanges 6, Fill vol 3000cc, dwell 1:30.  Last fill 3000cc, # day exchanges 1  Recent labs: Hgb 13.5, Tsat 34%. P 7.5, PTH 1132 > 244 when on HD and receiving hectorol (investigate this)   Assessment/Plan:  1. Nausea / vomiting / dry heaves / abd pain / anorexia / mild myoclonus: possible underdialysis on PD. Compliant with Rx at home. Creat up 15-20 range. Doing HD today and tomorrow to see if it will improve symptoms.  He also has hx of recurrent GI issues w N/V/abd pain/ hx of gastric banding, in the past prior to going on PD, so not sure if this is new or not.  2. ESRD: as above, HD today 3. Hypertension/volume - SBPs 130s-150s, no bp meds, close to dry wt 4. Anemia - Hgb > 12. Last Tsat 34%. No op ESAs or Fe. 5. Metabolic bone disease / CKD: holding sensipar with GI problems, changed phoslo to Fosrenol as binder, pt request.  Big jump in PTH per op labs, now 1132 up from 244 when he was on hemodialysis and receiving high dose hectorol. Not on calcitriol. Recheck PTH.  6. Nutrition - Renal diet 7. DM - stated as diet controlled, Last op AlC 6.0 on 01/12/13 8. Hx gastric bypass 2003 / Hx gastric banding 2011   Vinson Moselle MD  pager [phone]    cell [phone]  02/23/2013, 8:34 AM     Recent Labs Lab 02/21/13 0944 02/22/13 0520  NA 139 139  K 3.6 3.5  CL 93* 93*  CO2 22 26  GLUCOSE 166* 102*  BUN 40* 45*  CREATININE 17.48* 18.36*  CALCIUM 8.8 7.9*    Recent Labs Lab 02/21/13 0944  AST 20  ALT 17  ALKPHOS 108  BILITOT 0.6  PROT 8.8*  ALBUMIN 3.9    Recent Labs Lab 02/21/13 0944 02/22/13 0520 02/23/13 0726  WBC 9.7 10.6* 10.0  NEUTROABS 8.8*  --   --   HGB 14.2 13.4 13.6  HCT 42.6 40.8 40.7  MCV 96.8 99.5 98.8  PLT 222 213 214   . heparin  5,000 Units Subcutaneous Q8H  . insulin aspart  0-9 Units Subcutaneous TID WC  . lanthanum  2,000 mg Oral TID WC  . sodium chloride  3 mL Intravenous Q12H   . dialysis solution 2.5% low-MG/low-CA    . heparin     acetaminophen, acetaminophen, HYDROmorphone (DILAUDID) injection, ondansetron (ZOFRAN) IV, ondansetron, oxyCODONE, promethazine

## 2013-02-23 NOTE — Procedures (Signed)
I was present at this dialysis session, have reviewed the session itself and made  appropriate changes  Vinson Moselle MD (pgr) [phone]    [phone] 02/23/2013, 8:46 AM

## 2013-02-24 ENCOUNTER — Inpatient Hospital Stay (HOSPITAL_COMMUNITY): Payer: Medicare Other

## 2013-02-24 LAB — BASIC METABOLIC PANEL
BUN: 29 mg/dL — ABNORMAL HIGH (ref 6–23)
CO2: 21 mEq/L (ref 19–32)
Calcium: 8.5 mg/dL (ref 8.4–10.5)
Chloride: 96 mEq/L (ref 96–112)
GFR calc Af Amer: 4 mL/min — ABNORMAL LOW (ref >90)
Glucose, Bld: 92 mg/dL (ref 70–99)
Potassium: 3.8 mEq/L (ref 3.5–5.1)

## 2013-02-24 LAB — GLUCOSE, CAPILLARY
Glucose-Capillary: 212 mg/dL — ABNORMAL HIGH (ref 70–99)
Glucose-Capillary: 88 mg/dL (ref 70–99)

## 2013-02-24 LAB — BODY FLUID CULTURE: Special Requests: NORMAL

## 2013-02-24 MED ORDER — CALCITRIOL 0.25 MCG PO CAPS
0.2500 ug | ORAL_CAPSULE | Freq: Every evening | ORAL | Status: DC
  Administered 2013-02-24: 0.25 ug via ORAL
  Filled 2013-02-24: qty 1

## 2013-02-24 MED ORDER — HYDROMORPHONE HCL PF 1 MG/ML IJ SOLN
INTRAMUSCULAR | Status: AC
  Filled 2013-02-24: qty 1

## 2013-02-24 MED ORDER — CALCITRIOL 0.25 MCG PO CAPS: 0.2500 ug | ORAL_CAPSULE | Freq: Every evening | ORAL | Status: DC

## 2013-02-24 MED ORDER — SIMETHICONE 80 MG PO CHEW: 80.0000 mg | CHEWABLE_TABLET | Freq: Four times a day (QID) | ORAL | Status: DC | PRN

## 2013-02-24 MED ORDER — WHITE PETROLATUM GEL
Status: AC
  Filled 2013-02-24: qty 5

## 2013-02-24 MED ORDER — IOHEXOL 300 MG/ML  SOLN
25.0000 mL | INTRAMUSCULAR | Status: AC
  Administered 2013-02-24: 25 mL via ORAL

## 2013-02-24 MED ORDER — DIPHENHYDRAMINE HCL 50 MG/ML IJ SOLN
25.0000 mg | Freq: Four times a day (QID) | INTRAMUSCULAR | Status: DC | PRN
  Administered 2013-02-24: 25 mg via INTRAVENOUS
  Filled 2013-02-24: qty 1

## 2013-02-24 MED ORDER — LANTHANUM CARBONATE 1000 MG PO CHEW: 2000.0000 mg | CHEWABLE_TABLET | Freq: Three times a day (TID) | ORAL | Status: DC

## 2013-02-24 MED ORDER — IOHEXOL 300 MG/ML  SOLN
80.0000 mL | Freq: Once | INTRAMUSCULAR | Status: AC | PRN
  Administered 2013-02-24: 80 mL via INTRAVENOUS

## 2013-02-24 NOTE — Progress Notes (Signed)
[facility] Progress Note  Subjective:   Feeling better but still with diarrhea x 3 episodes this am. Itching s/p CT with contrast. Denies further abdominal pain. Anxious to try to eat.  Still had some arm jerking last night. Eating solid food today, creat down 14 today.   Objective Filed Vitals:   02/23/13 1652 02/23/13 2135 02/24/13 0535 02/24/13 0840  BP: 149/80 148/80 95/55 115/59  Pulse: 117 115 99 102  Temp: 97.6 F (36.4 C) 98.7 F (37.1 C) 98.2 F (36.8 C) 98 F (36.7 C)  TempSrc: Oral Oral Oral   Resp: 20 20 20 20   Height:      Weight:  117.618 kg (259 lb 4.8 oz)    SpO2: 95% 96% 96% 96%   Physical Exam General:  Alert, cooperative, NAD Heart: RRR, no murmur or rub Lungs: CTA bilat, no wheezes or rhonchi Abdomen: Obese, soft, NT + BS Extremities: No LE edema Neuro - No asterixis Dialysis Access: PD cath RLQ dressed/ LUA AVF patent  PD Orders: EDW 122 kg  7 x week, # exchanges 6, Fill vol 3000cc, dwell 1:30.  Last fill 3000cc, # day exchanges 1  Recent labs: Hgb 13.5, Tsat 34%. P 7.5, PTH 1132 > 244 when on HD and receiving hectorol (investigate this)   Assessment/Plan: 1. Nausea / vomiting / dry heaves: better after HD x 1. Eating solid food. Possible PD failure vs poor compliance. Pt denies noncompliance. HD today. Spoke with pt's primary nephrologist, continue same PD regimen for now.  Has travel plans 12/18 to visit [place] for 3 weeks. OK for d/c today after dialysis 2. ESRD: HD today 3. Hypertension/volume - SBPs 90s-110s, no bp meds, close to dry wt 4. Anemia - Hgb > 12. Last Tsat 34%. No op ESAs or Fe. 5. Metabolic bone disease / CKD: Ca 9.2 corrected. Holding sensipar with GI problems, changed phoslo to Fosrenol as binder, pt request. Big jump in PTH per op labs, now 1132 up from 244 when he was on hemodialysis and receiving high dose hectorol.  PTH 1098. Start calcitriol 0.25 mg qd and titrate up. Follow labs. 6. Nutrition - Renal  diet 7. DM - stated as diet controlled, Last op AlC 6.0 on 01/12/13 8. Hx gastric bypass 2003 / Hx gastric banding 2011 9. IV Contrast allergy: itching s/p iohexol administration. IV Benadryl and allergies updated here. Will notify op center.  Scot Jun. Broadus Octavia, PA-C [facility] Pager [phone] 02/24/2013,10:52 AM  LOS: 3 days   I have seen and examined patient, discussed with PA and agree with assessment and plan as outlined above. Vinson Moselle MD pager [phone]    cell [phone] 02/24/2013, 12:45 PM    Additional Objective Labs: Basic Metabolic Panel:  Recent Labs Lab 02/22/13 0520 02/23/13 0727 02/24/13 0545  NA 139 139 140  K 3.5 3.1* 3.8  CL 93* 93* 96  CO2 26 23 21   GLUCOSE 102* 88 92  BUN 45* 49* 29*  CREATININE 18.36* 19.22* 14.45*  CALCIUM 7.9* 8.1* 8.5  PHOS  --  8.7*  --    Liver Function Tests:  Recent Labs Lab 02/21/13 0944 02/23/13 0727  AST 20  --   ALT 17  --   ALKPHOS 108  --   BILITOT 0.6  --   PROT 8.8*  --   ALBUMIN 3.9 3.4*   CBC:  Recent Labs Lab 02/21/13 0944 02/22/13 0520 02/23/13 0726  WBC 9.7 10.6* 10.0  NEUTROABS 8.8*  --   --  HGB 14.2 13.4 13.6  HCT 42.6 40.8 40.7  MCV 96.8 99.5 98.8  PLT 222 213 214   Blood Culture    Component Value Date/Time   SDES BLOOD RIGHT HAND 02/21/2013 1927   SPECREQUEST BOTTLES DRAWN AEROBIC ONLY 4CC 02/21/2013 1927   CULT  Value:        BLOOD CULTURE RECEIVED NO GROWTH TO DATE CULTURE WILL BE HELD FOR 5 DAYS BEFORE ISSUING A FINAL NEGATIVE REPORT Performed at [facility] 02/21/2013 1927   REPTSTATUS PENDING 02/21/2013 1927     CBG:  Recent Labs Lab 02/22/13 2054 02/23/13 1305 02/23/13 1649 02/23/13 2137 02/24/13 0730  GLUCAP 134* 111* 116* 118* 105*   Studies/Results: Ct Abdomen Pelvis W Contrast  02/24/2013   CLINICAL DATA:  Severe abdominal pain. Recent switched a hemodialysis from peritoneal dialysis.  EXAM: CT ABDOMEN AND PELVIS WITH CONTRAST   TECHNIQUE: Multidetector CT imaging of the abdomen and pelvis was performed using the standard protocol following bolus administration of intravenous contrast.  CONTRAST:  80mL OMNIPAQUE IOHEXOL 300 MG/ML  SOLN  COMPARISON:  CT 02/11/2013  FINDINGS: Lung bases are clear. No effusions. Heart is normal size.  Peritoneal dialysis catheter noted in the pelvis. Gastric band device also noted in place. There is free air adjacent to the liver. This may be related to recent peritoneal dialysis. Recommend clinical correlation. I see no obvious bowel abnormality to suggest perforated viscus. Appendix is visualized and is normal. Small bowel is decompressed. Scattered sigmoid diverticula without active diverticulitis.  Prior cholecystectomy. Liver, spleen, pancreas, adrenals are unremarkable. Mildly atrophic kidneys with cortical thinning. Small cyst in the lower pole of the left kidney. No hydronephrosis.  No free fluid or adenopathy.  Urinary bladder is unremarkable.  No acute bony abnormality.  IMPRESSION: Small amount of free air adjacent to the liver. This is presumably related to recent peritoneal dialysis, but recommend clinical correlation for last peritoneal dialysis. I see no evidence for perforated bowel.  Sigmoid diverticulosis without active diverticulitis.   Electronically Signed   By: Charlett Nose M.D.   On: 02/24/2013 10:42   Medications: . dialysis solution 2.5% low-MG/low-CA    . heparin     . heparin  5,000 Units Subcutaneous Q8H  . insulin aspart  0-9 Units Subcutaneous TID WC  . lanthanum  2,000 mg Oral TID WC  . sodium chloride  3 mL Intravenous Q12H

## 2013-02-24 NOTE — Discharge Summary (Signed)
Physician Discharge Summary  MAVEN VARELAS AVW:[id number] DOB: 06-22-1965 DOA: 02/21/2013  PCP: [name]., MD  Admit date: 02/21/2013 Discharge date: 02/24/2013  Time spent: 30 minutes  Recommendations for Outpatient Follow-up:  1. Follow up with PCP in one week.  2. Follow up with GI if abdominal pain re occurs.   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   Diet recommendation: renal diet  Filed Weights   02/23/13 2135 02/24/13 1310 02/24/13 1750  Weight: 117.618 kg (259 lb 4.8 oz) 116.1 kg (255 lb 15.3 oz) 116 kg (255 lb 11.7 oz)    History of present illness:  Randall [name] is a 47 y.o. male with past medical history of ESRD was on hemodialysis started recently on peritoneal dialysis. Diet-controlled DM and hypertension. Patient came to the hospital because of abdominal pain. Patient said it started about 2 days ago with some nausea and dry heaves but no vomiting. Has generalized abdominal pain, denies fever, chills, denies diarrhea. No recent sick contacts. Patient had similar episode on 12/3 and came in to the ED, CT scan done at that time and showed no abnormalities.     Hospital Course:  Abdominal pain  - More post prandial.  -Unclear etiology, absence of fever and leukocytosis no evidence of peritonitis.  -Unsure if it's related to his lapband surgery.  -No leukocytosis but there is neutrophilia, blood cultures obtained, and low threshold to start antibiotics.  - could be secondary to PD,  He underwent HD on Monday and Tuesday and his symptoms improved slightly. - we got CT abdomen and pelvis, showed diverticulosis. On discharge his abdominal pain is almost resolved. Recommended follow up with GI if abdominal pain re occurs.  ESRD  -Patient was on hemodialysis, was recently started on  peritoneal dialysis.  -Nephrology notified, likely to be hemodialyzed again.  -No evidence of pulmonary edema or marked fluid overload.  Elevated lactic acid  -This is likely secondary to his end-stage renal disease and inability to clear lactic acid.  -Has mild abdominal tenderness, not clinically supporting presence of occlusive mesenteric ischemia.  -No fever, leukocytosis or evidence of infection. Low threshold to start antibiotics, blood cultures obtained and negative so far. His abdominal pain is also resolved on discharged.   - repeat level is within normal limits.  Hypertension  -controlled.  Diabetes mellitus type 2  -Diet controlled, start insulin sliding scale.  CBG (last 3)   Recent Labs   02/22/13 2054  02/23/13 1305  02/23/13 1649   GLUCAP  134*  111*  116*    - hemoglobin A1c is 5.9      Procedures:  HD twice.   Consultations:  renal  Discharge Exam: Filed Vitals:   02/24/13 1750  BP: 116/77  Pulse: 104  Temp: 97.4 F (36.3 C)  Resp: 17    General: alert afebrile comfortable Cardiovascular: s1s2 Respiratory: ctab  Discharge Instructions  Discharge Orders   Future Orders Complete By Expires   Discharge instructions  As directed    Comments:     Follow up with PCP in one week Follow up with nephrology as recommended.       Medication List    STOP taking these medications       calcium acetate 667 MG capsule  Commonly known as:  PHOSLO      TAKE these medications       calcitRIOL 0.25 MCG capsule  Commonly known as:  ROCALTROL  Take 1 capsule (0.25 mcg total) by mouth every evening.     gabapentin 300 MG capsule  Commonly known as:  NEURONTIN  Take 300-600 mg by mouth 2 (two) times daily. 1 tablet in the morning, 2 tablets in the evening     lanthanum 1000 MG chewable tablet  Commonly known as:  FOSRENOL  Chew 2 tablets (2,000 mg total) by mouth 3 (three) times daily with meals.     SENSIPAR 60 MG tablet  Generic drug:   cinacalcet  Take 60 mg by mouth at bedtime.     simethicone 80 MG chewable tablet  Commonly known as:  GAS-X  Chew 1 tablet (80 mg total) by mouth every 6 (six) hours as needed for flatulence.       Allergies  Allergen Reactions  . Iohexol Itching    Pt had severe itching to head,face,upper chest after 80 ml Omni 300 administered.No obvious hives. I spoke to Dr Ruffin Frederick, and he didn't feel the need for Benadryl to be given.  KR       Follow-up Information   Follow up with [name]., MD. Schedule an appointment as soon as possible for a visit in 1 week.   Specialty:  Internal Medicine   Contact information:   [address] [place] [address] [phone]        The results of significant diagnostics from this hospitalization (including imaging, microbiology, ancillary and laboratory) are listed below for reference.    Significant Diagnostic Studies: Ct Abdomen Pelvis W Contrast  02/24/2013   CLINICAL DATA:  Severe abdominal pain. Recent switched a hemodialysis from peritoneal dialysis.  EXAM: CT ABDOMEN AND PELVIS WITH CONTRAST  TECHNIQUE: Multidetector CT imaging of the abdomen and pelvis was performed using the standard protocol following bolus administration of intravenous contrast.  CONTRAST:  80mL OMNIPAQUE IOHEXOL 300 MG/ML  SOLN  COMPARISON:  CT 02/11/2013  FINDINGS: Lung bases are clear. No effusions. Heart is normal size.  Peritoneal dialysis catheter noted in the pelvis. Gastric band device also noted in place. There is free air adjacent to the liver. This may be related to recent peritoneal dialysis. Recommend clinical correlation. I see no obvious bowel abnormality to suggest perforated viscus. Appendix is visualized and is normal. Small bowel is decompressed. Scattered sigmoid diverticula without active diverticulitis.  Prior cholecystectomy. Liver, spleen, pancreas, adrenals are unremarkable. Mildly atrophic kidneys with cortical thinning. Small cyst  in the lower pole of the left kidney. No hydronephrosis.  No free fluid or adenopathy.  Urinary bladder is unremarkable.  No acute bony abnormality.  IMPRESSION: Small amount of free air adjacent to the liver. This is presumably related to recent peritoneal dialysis, but recommend clinical correlation for last peritoneal dialysis. I see no evidence for perforated bowel.  Sigmoid diverticulosis without active diverticulitis.   Electronically Signed   By: Charlett Nose M.D.   On: 02/24/2013 10:42   Ct Abdomen Pelvis W Contrast  02/11/2013   CLINICAL DATA:  Abdominal pain.  EXAM: CT ABDOMEN AND PELVIS WITH CONTRAST  TECHNIQUE: Multidetector CT imaging of the abdomen and pelvis was performed using the standard protocol following bolus administration of intravenous contrast.  CONTRAST:  80mL OMNIPAQUE IOHEXOL 300 MG/ML  SOLN  COMPARISON:  CT scan of January 21, 2013.  FINDINGS: No acute abnormality is seen in the visualized  lung bases. Status post gastric band procedure which is unchanged compared to prior exam. Status post cholecystectomy. No focal abnormality is seen in the liver, spleen or pancreas. There is seen and increased amount of fluid around the spleen and liver compared to prior exam, which most likely is related to peritoneal dialysis. Peritoneal dialysis catheter is again noted in the left side of the pelvis with free fluid present in this area which is increased compared to prior exam. Small amount of pneumoperitoneum is noted in the epigastric region which was not clearly seen on prior exam. Bilateral renal atrophy is noted and unchanged. Adrenal glands appear normal. No evidence of bowel obstruction is noted. No osseous abnormality is noted.  IMPRESSION: Status post gastric banding procedure and cholecystectomy. Peritoneal dialysis catheter is again noted in the left side of the pelvis which is unchanged compared to prior exam. There is noted an increased amount of fluid in the pelvis as well as  around the spleen and liver most likely related to peritoneal dialysis.  There does appear to be a small amount of pneumoperitoneum present in the epigastric 3 which was not present on prior exam, but most likely is related to peritoneal dialysis.   Electronically Signed   By: Roque Lias M.D.   On: 02/11/2013 13:42   Dg Abd Acute W/chest  02/21/2013   CLINICAL DATA:  Left lower quadrant pain with recurrent vomiting, cough  EXAM: ACUTE ABDOMEN SERIES (ABDOMEN 2 VIEW & CHEST 1 VIEW)  COMPARISON:  CT abdomen pelvis dated 02/11/2013. Chest/abdominal radiographs dated 02/11/2013.  FINDINGS: Lungs are clear. No pleural effusion or pneumothorax.  The heart is normal in size.  Nonspecific bowel gas pattern, without disproportionate small bowel dilatation to suggest small bowel obstruction.  No evidence of free air under the diaphragm on the upright view.  Lap band in satisfactory position. Cholecystectomy clips. Peritoneal dialysis catheter terminating in the left pelvis.  IMPRESSION: No evidence of acute cardiopulmonary disease.  No evidence of small bowel obstruction or free air.  Postsurgical changes, as above.   Electronically Signed   By: Charline Bills M.D.   On: 02/21/2013 12:06   Dg Abd Acute W/chest  02/11/2013   CLINICAL DATA:  Shortness of breath, abdominal pain.  EXAM: ACUTE ABDOMEN SERIES (ABDOMEN 2 VIEW & CHEST 1 VIEW)  COMPARISON:  CT scan and chest radiograph of January 21, 2013.  FINDINGS: There is no evidence of dilated bowel loops. Peritoneal dialysis catheter is seen loops within the left side of the pelvis. Status post gastric band procedure. Status post cholecystectomy. There is noted free air underneath the right he mid which may be related to peritoneal dialysis, although rupture of hollow viscus cannot be excluded. No radiopaque calculi is seen. Heart size and mediastinal contours are within normal limits. Both lungs are clear.  IMPRESSION: No acute cardiopulmonary abnormality seen. No  evidence of bowel obstruction or ileus is noted. Small amount of free air is seen underneath the right hemidiaphragm which may be related to peritoneal dialysis, but rupture of hollow viscus cannot be excluded. Johnnette Gourd was notified of this finding by myself personally at 8:25 a.m.   Electronically Signed   By: Roque Lias M.D.   On: 02/11/2013 08:30    Microbiology: Recent Results (from the past 240 hour(s))  BODY FLUID CULTURE     Status: None   Collection Time    02/21/13 10:25 AM      Result Value Range Status   Specimen Description  PERITONEAL CAVITY   Final   Special Requests Normal   Final   Gram Stain     Final   Value: Performed at [facility] CYTOSPIN WBC PRESENT,BOTH PMN AND MONONUCLEAR     NO ORGANISMS SEEN     Performed at [facility]   Culture     Final   Value: NO GROWTH 3 DAYS     Performed at [facility]   Report Status 02/24/2013 FINAL   Final  GRAM STAIN     Status: None   Collection Time    02/21/13 10:25 AM      Result Value Range Status   Specimen Description PERITONEAL CAVITY   Final   Special Requests Normal   Final   Gram Stain     Final   Value: WBC PRESENT,BOTH PMN AND MONONUCLEAR     NO ORGANISMS SEEN     CYTOSPIN SLIDE   Report Status 02/21/2013 FINAL   Final  MRSA PCR SCREENING     Status: None   Collection Time    02/21/13  5:18 PM      Result Value Range Status   MRSA by PCR NEGATIVE  NEGATIVE Final   Comment:            The GeneXpert MRSA Assay (FDA     approved for NASAL specimens     only), is one component of a     comprehensive MRSA colonization     surveillance program. It is not     intended to diagnose MRSA     infection nor to guide or     monitor treatment for     MRSA infections.  CULTURE, BLOOD (ROUTINE X 2)     Status: None   Collection Time    02/21/13  7:15 PM      Result Value Range Status   Specimen Description BLOOD RIGHT ARM   Final   Special Requests BOTTLES DRAWN AEROBIC AND ANAEROBIC  10CC EA   Final   Culture  Setup Time     Final   Value: 02/22/2013 02:28     Performed at [facility]   Culture     Final   Value:        BLOOD CULTURE RECEIVED NO GROWTH TO DATE CULTURE WILL BE HELD FOR 5 DAYS BEFORE ISSUING A FINAL NEGATIVE REPORT     Performed at [facility]   Report Status PENDING   Incomplete  CULTURE, BLOOD (ROUTINE X 2)     Status: None   Collection Time    02/21/13  7:27 PM      Result Value Range Status   Specimen Description BLOOD RIGHT HAND   Final   Special Requests BOTTLES DRAWN AEROBIC ONLY 4CC   Final   Culture  Setup Time     Final   Value: 02/22/2013 02:28     Performed at [facility]   Culture     Final   Value:        BLOOD CULTURE RECEIVED NO GROWTH TO DATE CULTURE WILL BE HELD FOR 5 DAYS BEFORE ISSUING A FINAL NEGATIVE REPORT     Performed at [facility]   Report Status PENDING   Incomplete     Labs: Basic Metabolic Panel:  Recent Labs Lab 02/21/13 0944 02/22/13 0520 02/23/13 0727 02/24/13 0545  NA 139 139 139 140  K 3.6 3.5 3.1* 3.8  CL 93* 93* 93* 96  CO2 22 26 23 21   GLUCOSE 166* 102* 88 92  BUN 40* 45* 49* 29*  CREATININE 17.48* 18.36* 19.22* 14.45*  CALCIUM 8.8 7.9* 8.1* 8.5  PHOS  --   --  8.7*  --    Liver Function Tests:  Recent Labs Lab 02/21/13 0944 02/23/13 0727  AST 20  --   ALT 17  --   ALKPHOS 108  --   BILITOT 0.6  --   PROT 8.8*  --   ALBUMIN 3.9 3.4*   No results found for this basename: LIPASE, AMYLASE,  in the last 168 hours No results found for this basename: AMMONIA,  in the last 168 hours CBC:  Recent Labs Lab 02/21/13 0944 02/22/13 0520 02/23/13 0726  WBC 9.7 10.6* 10.0  NEUTROABS 8.8*  --   --   HGB 14.2 13.4 13.6  HCT 42.6 40.8 40.7  MCV 96.8 99.5 98.8  PLT 222 213 214   Cardiac Enzymes: No results found for this basename: CKTOTAL, CKMB, CKMBINDEX, TROPONINI,  in the last 168 hours BNP: BNP (last 3 results) No results found for this basename:  PROBNP,  in the last 8760 hours CBG:  Recent Labs Lab 02/23/13 1305 02/23/13 1649 02/23/13 2137 02/24/13 0730 02/24/13 1128  GLUCAP 111* 116* 118* 105* 212*       Signed:  Zebediah Beezley  [facility] 02/24/2013, 6:24 PM

## 2013-02-24 NOTE — Progress Notes (Addendum)
Pt developed severe itching to his head, face, and upper chest after receiving 80ml Omnipaque 300. Pt stated that this has happened one other time, and he was given Benadryl (PO) when he returned to his room. The reaction was unknown to [place] until pt started itching today! No allergies documented in Epic.  I spoke to Dr Ruffin Frederick, GRA. He declined to administer Benadryl in the absence of hives.  I did contact Dr Blake Divine, who ordered  IV Benadryl for pt.

## 2013-02-25 NOTE — Progress Notes (Signed)
Late entry> Patient discharged home. Patient was discharged home with prescriptions and discharge information. Patient was told to restart PD and to call with any questions or complications. Patient was stable upon discharge.

## 2013-02-28 LAB — CULTURE, BLOOD (ROUTINE X 2): Culture: NO GROWTH

## 2013-03-26 ENCOUNTER — Other Ambulatory Visit: Payer: Self-pay | Admitting: Nephrology

## 2013-03-26 DIAGNOSIS — K4090 Unilateral inguinal hernia, without obstruction or gangrene, not specified as recurrent: Secondary | ICD-10-CM

## 2013-04-01 ENCOUNTER — Inpatient Hospital Stay: Admission: RE | Admit: 2013-04-01 | Payer: 59 | Source: Ambulatory Visit

## 2013-04-03 ENCOUNTER — Ambulatory Visit
Admission: RE | Admit: 2013-04-03 | Discharge: 2013-04-03 | Disposition: A | Discharge status: Home / Self Care | Payer: Medicare Other | Source: Ambulatory Visit | Attending: Nephrology | Admitting: Nephrology

## 2013-04-03 DIAGNOSIS — K4090 Unilateral inguinal hernia, without obstruction or gangrene, not specified as recurrent: Secondary | ICD-10-CM

## 2013-04-03 MED ORDER — IOHEXOL 300 MG/ML  SOLN
125.0000 mL | Freq: Once | INTRAMUSCULAR | Status: AC | PRN
  Administered 2013-04-03: 125 mL via INTRAVENOUS

## 2013-04-29 ENCOUNTER — Encounter (INDEPENDENT_AMBULATORY_CARE_PROVIDER_SITE_OTHER): Payer: Self-pay | Admitting: Surgery

## 2013-04-29 ENCOUNTER — Ambulatory Visit (INDEPENDENT_AMBULATORY_CARE_PROVIDER_SITE_OTHER): Payer: Medicare Other | Admitting: Surgery

## 2013-04-29 ENCOUNTER — Other Ambulatory Visit (INDEPENDENT_AMBULATORY_CARE_PROVIDER_SITE_OTHER): Payer: Self-pay | Admitting: Surgery

## 2013-04-29 VITALS — BP 130/86 | HR 88 | Resp 18 | Ht 71.0 in | Wt 274.0 lb

## 2013-04-29 DIAGNOSIS — Z992 Dependence on renal dialysis: Secondary | ICD-10-CM

## 2013-04-29 DIAGNOSIS — N186 End stage renal disease: Secondary | ICD-10-CM

## 2013-04-29 DIAGNOSIS — Z538 Procedure and treatment not carried out for other reasons: Secondary | ICD-10-CM

## 2013-04-29 NOTE — Progress Notes (Signed)
Chief Complaint:  Intolerance of peritoneal dialysis catheter;  For removal  History of Present Illness:  Randall [name] is an 48 y.o. male who had been over bypass 4 years ago and who had a peritoneal bowels catheter inserted this past year. He was unable to tolerate that and recently had some swelling of the scrotum., Sounds like he may have had a communicating hydrocele as he reports after infusing saline his scrotum would get very large. It would go down when the fluid was taken off. He has CT scan which I reviewed which did not show any evidence of an inguinal hernia and no complicating factors within his abdominal wall. He was having trouble with his PD catheter leaking and they wanted to come out. He looks good but apparently they wanted a little more weight. He is on the renal transplant list at [facility].  Past Medical History  Diagnosis Date  . ESRD on hemodialysis     [place] [facility] on MWF, ESRD due to DM/HTN  . Recurrent vomiting   . Hyperlipidemia   . Thyroid disorder     takes sensipar  . Diabetes mellitus     controlling by weight/diet  . 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 GERD (gastroesophageal reflux disease)   . Hypertension   . Hyperthyroidism     Past Surgical History  Procedure Laterality Date  . Gastric restriction surgery  12/06/09    lap band  . Dialysis fistula creation  2011  . Insertion of dialysis catheter  11/13/2011    Procedure: INSERTION OF DIALYSIS CATHETER;  Surgeon: Sherren Kerns, MD;  [facility] OR;  Service: Vascular;  Laterality: Right;  Insertion of 23cm dialysis catheter in right IJ  . Av fistula placement  12/05/2011    Procedure: ARTERIOVENOUS (AV) FISTULA CREATION;  Surgeon: Larina Earthly, MD;  Location: [facility] OR;  Service: Vascular;  Laterality: Left;  . Av fistula placement  01-23-2012    #1 ligation of 2 competing branches of left upper arm AVF   #2  superifical mobilization of left upper arm AVF     Current Outpatient Prescriptions   Medication Sig Dispense Refill  . calcitRIOL (ROCALTROL) 0.25 MCG capsule Take 1 capsule (0.25 mcg total) by mouth every evening.  15 capsule  0  . gabapentin (NEURONTIN) 300 MG capsule Take 300-600 mg by mouth 2 (two) times daily. 1 tablet in the morning, 2 tablets in the evening      . lanthanum (FOSRENOL) 1000 MG chewable tablet Chew 2 tablets (2,000 mg total) by mouth 3 (three) times daily with meals.  90 tablet  1  . SENSIPAR 60 MG tablet Take 60 mg by mouth at bedtime.       . simethicone (GAS-X) 80 MG chewable tablet Chew 1 tablet (80 mg total) by mouth every 6 (six) hours as needed for flatulence.  30 tablet  0   No current facility-administered medications for this visit.   Iohexol Family History  Problem Relation Age of Onset  . Hypertension Mother   . Diabetes Mother   . Hypertension Father   . Diabetes Father    Social History:   reports that he has never smoked. He has never used smokeless tobacco. He reports that he does not drink alcohol or use illicit drugs.   REVIEW OF SYSTEMS - PERTINENT POSITIVES ONLY: See old chart  Physical Exam:   Blood pressure 130/86, pulse 88, resp. rate 18, height 5\' 11"  (1.803 m), weight  274 lb (124.286 kg). Body mass index is 38.23 kg/(m^2).  Gen:  WDWN African American male NAD  Neurological: Alert and oriented to person, place, and time. Motor and sensory function is grossly intact  Head: Normocephalic and atraumatic.  Eyes: Conjunctivae are normal. Pupils are equal, round, and reactive to light. No scleral icterus.  Neck: Normal range of motion. Neck supple. No tracheal deviation or thyromegaly present.  Cardiovascular:  SR without murmurs or gallops.  No carotid bruits Respiratory: Effort normal.  No respiratory distress. No chest wall tenderness. Breath sounds normal.  No wheezes, rales or rhonchi.  Abdomen:  PD catheter and right upper quadrant. GU: Musculoskeletal: Normal range of motion. Extremities are nontender. No cyanosis,  edema or clubbing noted Lymphadenopathy: No cervical, preauricular, postauricular or axillary adenopathy is present Skin: Skin is warm and dry. No rash noted. No diaphoresis. No erythema. No pallor. Pscyh: Normal mood and affect. Behavior is normal. Judgment and thought content normal.   LABORATORY RESULTS: No results found for this or any previous visit (from the past 48 hour(s)).  RADIOLOGY RESULTS: No results found.  Problem List: Patient Active Problem List   Diagnosis Date Noted  . Elevated lactic acid level 02/21/2013  . Fitting and adjustment of gastric lap band 12/26/2012  . Abdominal pain 12/03/2012  . Unspecified constipation 09/06/2012  . Nausea and vomiting 04/30/2012  . Intractable nausea and vomiting 02/16/2012  . Vomiting 01/14/2012  . Hypertension 01/14/2012  . 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 ESRD (end stage renal disease) on dialysis 01/12/2011    Assessment & Plan: Laparoscopically guided removal of PD catheter.      Matt B. Daphine DeutscherMartin, MD, [facility] CenterFACS  [facility] [phone] beeper [phone]  04/29/2013 3:04 PM

## 2013-04-29 NOTE — Patient Instructions (Signed)
Peritoneal Dialysis Catheter Placement Peritoneal dialysis catheter placement is a surgery to insert a thin, flexible tube (catheter) in your abdomen. This surgery must be done before you begin peritoneal dialysis. Dialysis is a treatment that replaces some of the work that healthy kidneys do. During dialysis, wastes, salt, and extra water are removed from the blood. In peritoneal dialysis, these tasks are performed by transferring a fluid called dialysate to and from your abdomen during each session. The fluid goes through the catheter to enter the abdomen at the start of each dialysis session, and it drains out of the body through the catheter at the end of each session.  The catheter will be small, soft, and easy to conceal. The surgery is usually done at least 2 weeks before you begin dialysis. LET [facility] CARE PROVIDER KNOW ABOUT:   Any allergies you have.   All medicines you are taking, including vitamins, herbs, eye drops, creams, and over-the-counter medicines.  Use of steroids (by mouth or as creams).   Previous problems you or members of your family have had with the use of anesthetics.  Any blood disorders you have.  Previous surgeries you have had.   Smoking history.   Possibility of pregnancy, if this applies.   Medical conditions you have. RISKS AND COMPLICATIONS Generally, peritoneal dialysis catheter placement is a safe procedure. However, as with any procedure, complications can occur. Possible complications include:  Excessive bleeding.   Pain.   A collection of blood near the incision (hematoma).   Infection.   Slow healing.   Catheter problems after the surgery, such as the catheter becoming blocked, the catheter moving out of place, the catheter poking into or wrapping around intestines, or fluid leaking around the catheter.   Scarring.   Skin damage.   Damage to blood vessels, tissues, or organs (such as the intestines) in the abdomen  area.  BEFORE THE PROCEDURE  Ask your health care provider about changing or stopping your regular medicines. You may need to stop taking certain medicines up to 2 weeks before the procedure.  Do not eat or drink anything for at least 8 hours before the procedure.   You may be asked to wash with an antibacterial soap the night before or the morning of the procedure.  Make plans to have someone drive you home after the procedure or after your hospital stay. Ask your health care provider whether you should expect to stay in the hospital overnight or go home the same day. PROCEDURE The surgeon may use either an open or laparoscopic technique for this surgery.  Open surgery.  You will be given a medicine to make you sleep through the procedure (general anesthetic).  Once you are asleep, the surgeon will make a small cut (incision) in your abdomen.  The catheter will be put in place.  The incision will be closed with stitches or staples.  Laparoscopic surgery.  You will be given a medicine to numb the site selected for the incision (local anesthetic).  When the area is numb, the surgeon will make a small incision in your abdomen.  A thin, lighted tube with a tiny camera on the end (laparoscope) will be put through the incision. The camera sends pictures to a video screen in the operating room. This lets the surgeon see inside the abdomen during the procedure.  The catheter will be put in place.  The incision will be closed with stitches or staples. AFTER THE PROCEDURE  You will be  monitored closely in a recovery area. You may feel groggy from the anesthetic.  You may have some pain. If so, you will be given pain medicine.  You may be able to go home the same day as the procedure, or you may need to stay in the hospital overnight.  You will be given instructions on how to care for the catheter and how it is used for the dialysis process.  Your body will heal around the  catheter to hold it in place. Document Released: 02/14/2009 Document Revised: 10/29/2012 Document Reviewed: 08/18/2012 [facility] LLPExitCare Patient Information 2014 [employer], [place].

## 2013-05-12 ENCOUNTER — Encounter (HOSPITAL_COMMUNITY): Payer: Self-pay | Admitting: Pharmacy Technician

## 2013-05-14 ENCOUNTER — Encounter (HOSPITAL_COMMUNITY): Payer: Self-pay

## 2013-05-14 ENCOUNTER — Encounter (HOSPITAL_COMMUNITY)
Admission: RE | Admit: 2013-05-14 | Discharge: 2013-05-14 | Disposition: A | Discharge status: Home / Self Care | Payer: 59 | Source: Ambulatory Visit | Attending: Surgery | Admitting: Surgery

## 2013-05-14 DIAGNOSIS — Z01818 Encounter for other preprocedural examination: Secondary | ICD-10-CM | POA: Insufficient documentation

## 2013-05-14 HISTORY — DX: Dependence on renal dialysis: Z99.2

## 2013-05-14 NOTE — Patient Instructions (Addendum)
20 Randall KhanJohn P [name]  05/14/2013   Your procedure is scheduled on: Monday March 9th  Report to [facility] at 730 AM.  Call this number if you have problems the morning of surgery [phone]   Remember: FLEETS ENEMA NIGHT BEFORE SURGERY.  Do not eat food or drink liquids :After Midnight.     Take these medicines the morning of surgery with A SIP OF WATER: gabapentin                                SEE [place] PREPARING FOR SURGERY SHEET             You may not have any metal on your body including hair pins and piercings  Do not wear jewelry, make-up.  Do not wear lotions, powders, or perfumes. No  Deodorant is to be worn.   Men may shave face and neck.  Do not bring valuables to the hospital. [place] IS NOT RESPONSIBLE FOR VALUEABLES.  Contacts, dentures or bridgework may not be worn into surgery.  Leave suitcase in the car. After surgery it may be brought to your room.  For patients admitted to the hospital, checkout time is 11:00 AM the day of discharge.   Patients discharged the day of surgery will not be allowed to drive home.  Name and phone number of your driver: Samson FredericDJ Quesnell son cell [phone], or friend terry guooch  Pt will bring cell number day of surgery.  Special Instructions: N/A  Please read over the following fact sheets that you were given: [facility] Preparing for [facility] sheet.  Call Cain SieveSharon Bland Rudzinski RN pre op nurse if needed [phone]    FAILURE TO FOLLOW THESE INSTRUCTIONS MAY RESULT IN THE CANCELLATION OF YOUR SURGERY.  PATIENT SIGNATURE___________________________________________  NURSE SIGNATURE_____________________________________________

## 2013-05-14 NOTE — Progress Notes (Signed)
EKG 02-21-13 EPIC DR ABD AND CHEST 1 VIEW 02-21-13 EPIC

## 2013-05-18 ENCOUNTER — Encounter (HOSPITAL_COMMUNITY): Payer: Self-pay | Admitting: *Deleted

## 2013-05-18 ENCOUNTER — Encounter (HOSPITAL_COMMUNITY): Payer: Medicare Other | Admitting: Anesthesiology

## 2013-05-18 ENCOUNTER — Ambulatory Visit (HOSPITAL_COMMUNITY): Payer: Medicare Other | Admitting: Anesthesiology

## 2013-05-18 ENCOUNTER — Encounter (HOSPITAL_COMMUNITY)
Admission: RE | Disposition: A | Discharge status: Home / Self Care | Payer: Self-pay | Source: Ambulatory Visit | Attending: Surgery

## 2013-05-18 ENCOUNTER — Ambulatory Visit (HOSPITAL_COMMUNITY)
Admission: RE | Admit: 2013-05-18 | Discharge: 2013-05-19 | Disposition: A | Discharge status: Home / Self Care | Payer: Medicare Other | Source: Ambulatory Visit | Attending: Surgery | Admitting: Surgery

## 2013-05-18 DIAGNOSIS — I120 Hypertensive chronic kidney disease with stage 5 chronic kidney disease or end stage renal disease: Secondary | ICD-10-CM | POA: Insufficient documentation

## 2013-05-18 DIAGNOSIS — Z4902 Encounter for fitting and adjustment of peritoneal dialysis catheter: Secondary | ICD-10-CM

## 2013-05-18 DIAGNOSIS — N186 End stage renal disease: Secondary | ICD-10-CM | POA: Insufficient documentation

## 2013-05-18 DIAGNOSIS — E119 Type 2 diabetes mellitus without complications: Secondary | ICD-10-CM | POA: Insufficient documentation

## 2013-05-18 DIAGNOSIS — Z9884 Bariatric surgery status: Secondary | ICD-10-CM | POA: Insufficient documentation

## 2013-05-18 DIAGNOSIS — N19 Unspecified kidney failure: Secondary | ICD-10-CM | POA: Diagnosis present

## 2013-05-18 DIAGNOSIS — Z992 Dependence on renal dialysis: Secondary | ICD-10-CM

## 2013-05-18 DIAGNOSIS — E0590 Thyrotoxicosis, unspecified without thyrotoxic crisis or storm: Secondary | ICD-10-CM | POA: Insufficient documentation

## 2013-05-18 DIAGNOSIS — E785 Hyperlipidemia, unspecified: Secondary | ICD-10-CM | POA: Insufficient documentation

## 2013-05-18 DIAGNOSIS — K219 Gastro-esophageal reflux disease without esophagitis: Secondary | ICD-10-CM | POA: Insufficient documentation

## 2013-05-18 HISTORY — PX: CAPD REMOVAL: SHX5234

## 2013-05-18 LAB — CBC
HEMATOCRIT: 38.9 % — AB (ref 39.0–52.0)
Hemoglobin: 12.4 g/dL — ABNORMAL LOW (ref 13.0–17.0)
MCH: 31 pg (ref 26.0–34.0)
MCHC: 31.9 g/dL (ref 30.0–36.0)
MCV: 97.3 fL (ref 78.0–100.0)
PLATELETS: 163 10*3/uL (ref 150–400)
RBC: 4 MIL/uL — AB (ref 4.22–5.81)
RDW: 14.2 % (ref 11.5–15.5)
WBC: 8.2 10*3/uL (ref 4.0–10.5)

## 2013-05-18 LAB — BASIC METABOLIC PANEL
BUN: 43 mg/dL — ABNORMAL HIGH (ref 6–23)
CO2: 25 mEq/L (ref 19–32)
Calcium: 8.7 mg/dL (ref 8.4–10.5)
Chloride: 96 mEq/L (ref 96–112)
Creatinine, Ser: 12.6 mg/dL — ABNORMAL HIGH (ref 0.50–1.35)
GFR calc Af Amer: 5 mL/min — ABNORMAL LOW (ref >90)
GFR calc non Af Amer: 4 mL/min — ABNORMAL LOW (ref >90)
Glucose, Bld: 105 mg/dL — ABNORMAL HIGH (ref 70–99)
Potassium: 4.6 mEq/L (ref 3.7–5.3)
Sodium: 140 mEq/L (ref 137–147)

## 2013-05-18 LAB — GLUCOSE, CAPILLARY
Glucose-Capillary: 99 mg/dL (ref 70–99)
Glucose-Capillary: 113 mg/dL — ABNORMAL HIGH (ref 70–99)
Glucose-Capillary: 103 mg/dL — ABNORMAL HIGH (ref 70–99)

## 2013-05-18 SURGERY — LAPAROSCOPIC REMOVAL CONTINUOUS AMBULATORY PERITONEAL DIALYSIS  (CAPD) CATHETER
Anesthesia: General

## 2013-05-18 MED ORDER — PROPOFOL 10 MG/ML IV BOLUS
INTRAVENOUS | Status: DC | PRN
Start: 2013-05-18 — End: 2013-05-18
  Administered 2013-05-18: 200 mg via INTRAVENOUS

## 2013-05-18 MED ORDER — DEXTROSE 5 % IV SOLN
INTRAVENOUS | Status: AC
  Filled 2013-05-18 (×2): qty 1

## 2013-05-18 MED ORDER — ONDANSETRON HCL 4 MG PO TABS: 4.0000 mg | ORAL_TABLET | Freq: Four times a day (QID) | ORAL | Status: DC | PRN

## 2013-05-18 MED ORDER — FENTANYL CITRATE 0.05 MG/ML IJ SOLN
INTRAMUSCULAR | Status: AC
  Filled 2013-05-18: qty 5

## 2013-05-18 MED ORDER — ONDANSETRON HCL 4 MG/2ML IJ SOLN
INTRAMUSCULAR | Status: AC
  Filled 2013-05-18: qty 2

## 2013-05-18 MED ORDER — PHENYLEPHRINE 40 MCG/ML (10ML) SYRINGE FOR IV PUSH (FOR BLOOD PRESSURE SUPPORT)
PREFILLED_SYRINGE | INTRAVENOUS | Status: AC
  Filled 2013-05-18: qty 10

## 2013-05-18 MED ORDER — FENTANYL CITRATE 0.05 MG/ML IJ SOLN
INTRAMUSCULAR | Status: AC
  Filled 2013-05-18: qty 2

## 2013-05-18 MED ORDER — FENTANYL CITRATE 0.05 MG/ML IJ SOLN
INTRAMUSCULAR | Status: AC
Start: 2013-05-18 — End: 2013-05-19
  Filled 2013-05-18: qty 2

## 2013-05-18 MED ORDER — LANTHANUM CARBONATE 500 MG PO CHEW
2000.0000 mg | CHEWABLE_TABLET | ORAL | Status: DC
  Administered 2013-05-18: 2000 mg via ORAL
  Filled 2013-05-18 (×4): qty 4

## 2013-05-18 MED ORDER — GLYCOPYRROLATE 0.2 MG/ML IJ SOLN
INTRAMUSCULAR | Status: AC
  Filled 2013-05-18: qty 3

## 2013-05-18 MED ORDER — LANTHANUM CARBONATE 500 MG PO CHEW: 2000.0000 mg | CHEWABLE_TABLET | Freq: Three times a day (TID) | ORAL | Status: DC

## 2013-05-18 MED ORDER — BUPIVACAINE LIPOSOME 1.3 % IJ SUSP
20.0000 mL | Freq: Once | INTRAMUSCULAR | Status: AC
  Administered 2013-05-18: 20 mL
  Filled 2013-05-18: qty 20

## 2013-05-18 MED ORDER — LACTATED RINGERS IV SOLN: INTRAVENOUS | Status: DC

## 2013-05-18 MED ORDER — EPHEDRINE SULFATE 50 MG/ML IJ SOLN
INTRAMUSCULAR | Status: AC
  Filled 2013-05-18: qty 1

## 2013-05-18 MED ORDER — PROPOFOL 10 MG/ML IV BOLUS
INTRAVENOUS | Status: AC
Start: 2013-05-18 — End: 2013-05-18
  Filled 2013-05-18: qty 20

## 2013-05-18 MED ORDER — FENTANYL CITRATE 0.05 MG/ML IJ SOLN
INTRAMUSCULAR | Status: DC | PRN
  Administered 2013-05-18: 50 ug via INTRAVENOUS
  Administered 2013-05-18 (×3): 100 ug via INTRAVENOUS

## 2013-05-18 MED ORDER — CISATRACURIUM BESYLATE (PF) 10 MG/5ML IV SOLN
INTRAVENOUS | Status: DC | PRN
Start: 2013-05-18 — End: 2013-05-18
  Administered 2013-05-18: 10 mg via INTRAVENOUS
  Administered 2013-05-18: 2 mg via INTRAVENOUS

## 2013-05-18 MED ORDER — SODIUM CHLORIDE 0.9 % IV SOLN
INTRAVENOUS | Status: DC
  Administered 2013-05-18: 1000 mL via INTRAVENOUS

## 2013-05-18 MED ORDER — MIDAZOLAM HCL 2 MG/2ML IJ SOLN
INTRAMUSCULAR | Status: AC
  Filled 2013-05-18: qty 2

## 2013-05-18 MED ORDER — CHLORHEXIDINE GLUCONATE 4 % EX LIQD: 1.0000 "application " | Freq: Once | CUTANEOUS | Status: DC

## 2013-05-18 MED ORDER — GLYCOPYRROLATE 0.2 MG/ML IJ SOLN
INTRAMUSCULAR | Status: DC | PRN
  Administered 2013-05-18: 0.6 mg via INTRAVENOUS

## 2013-05-18 MED ORDER — NEOSTIGMINE METHYLSULFATE 1 MG/ML IJ SOLN
INTRAMUSCULAR | Status: DC | PRN
  Administered 2013-05-18: 4 mg via INTRAVENOUS

## 2013-05-18 MED ORDER — MIDAZOLAM HCL 5 MG/5ML IJ SOLN
INTRAMUSCULAR | Status: DC | PRN
Start: 2013-05-18 — End: 2013-05-18
  Administered 2013-05-18: 2 mg via INTRAVENOUS

## 2013-05-18 MED ORDER — SODIUM CHLORIDE 0.9 % IJ SOLN
INTRAMUSCULAR | Status: AC
  Filled 2013-05-18: qty 10

## 2013-05-18 MED ORDER — CEFOXITIN SODIUM 2 G IV SOLR
2.0000 g | INTRAVENOUS | Status: AC
  Administered 2013-05-18: 2 g via INTRAVENOUS
  Filled 2013-05-18: qty 2

## 2013-05-18 MED ORDER — LANTHANUM CARBONATE 500 MG PO CHEW: 2000.0000 mg | CHEWABLE_TABLET | ORAL | Status: DC

## 2013-05-18 MED ORDER — DEXTROSE-NACL 5-0.45 % IV SOLN
INTRAVENOUS | Status: DC
  Administered 2013-05-18 – 2013-05-19 (×2): via INTRAVENOUS

## 2013-05-18 MED ORDER — GABAPENTIN 300 MG PO CAPS
600.0000 mg | ORAL_CAPSULE | Freq: Two times a day (BID) | ORAL | Status: DC
  Administered 2013-05-18: 600 mg via ORAL
  Filled 2013-05-18 (×3): qty 2

## 2013-05-18 MED ORDER — FENTANYL CITRATE 0.05 MG/ML IJ SOLN
25.0000 ug | INTRAMUSCULAR | Status: DC | PRN
  Administered 2013-05-18 (×3): 50 ug via INTRAVENOUS

## 2013-05-18 MED ORDER — HEPARIN SODIUM (PORCINE) 5000 UNIT/ML IJ SOLN
5000.0000 [IU] | Freq: Three times a day (TID) | INTRAMUSCULAR | Status: DC
  Administered 2013-05-18 – 2013-05-19 (×2): 5000 [IU] via SUBCUTANEOUS
  Filled 2013-05-18 (×5): qty 1

## 2013-05-18 MED ORDER — CINACALCET HCL 30 MG PO TABS
60.0000 mg | ORAL_TABLET | Freq: Every day | ORAL | Status: DC
  Administered 2013-05-18: 60 mg via ORAL
  Filled 2013-05-18 (×2): qty 2

## 2013-05-18 MED ORDER — HYDROCODONE-ACETAMINOPHEN 5-325 MG PO TABS
1.0000 | ORAL_TABLET | ORAL | Status: DC | PRN
  Administered 2013-05-18 – 2013-05-19 (×3): 2 via ORAL
  Filled 2013-05-18 (×3): qty 2

## 2013-05-18 MED ORDER — PHENYLEPHRINE HCL 10 MG/ML IJ SOLN
INTRAMUSCULAR | Status: DC | PRN
Start: 2013-05-18 — End: 2013-05-18
  Administered 2013-05-18 (×2): 80 ug via INTRAVENOUS

## 2013-05-18 MED ORDER — ONDANSETRON HCL 4 MG/2ML IJ SOLN
INTRAMUSCULAR | Status: DC | PRN
  Administered 2013-05-18: 4 mg via INTRAVENOUS

## 2013-05-18 MED ORDER — MORPHINE SULFATE 2 MG/ML IJ SOLN: 1.0000 mg | INTRAMUSCULAR | Status: DC | PRN

## 2013-05-18 MED ORDER — ONDANSETRON HCL 4 MG/2ML IJ SOLN: 4.0000 mg | Freq: Four times a day (QID) | INTRAMUSCULAR | Status: DC | PRN

## 2013-05-18 MED ORDER — 0.9 % SODIUM CHLORIDE (POUR BTL) OPTIME
TOPICAL | Status: DC | PRN
  Administered 2013-05-18: 1000 mL

## 2013-05-18 MED ORDER — HEPARIN SODIUM (PORCINE) 5000 UNIT/ML IJ SOLN
5000.0000 [IU] | Freq: Once | INTRAMUSCULAR | Status: AC
  Administered 2013-05-18: 5000 [IU] via SUBCUTANEOUS
  Filled 2013-05-18: qty 1

## 2013-05-18 MED ORDER — LANTHANUM CARBONATE 500 MG PO CHEW
3000.0000 mg | CHEWABLE_TABLET | Freq: Three times a day (TID) | ORAL | Status: DC
  Administered 2013-05-19: 3000 mg via ORAL
  Filled 2013-05-18 (×4): qty 6

## 2013-05-18 SURGICAL SUPPLY — 33 items
BENZOIN TINCTURE PRP APPL 2/3 (GAUZE/BANDAGES/DRESSINGS) IMPLANT
CANISTER SUCTION 2500CC (MISCELLANEOUS) IMPLANT
CLOSURE WOUND 1/2 X4 (GAUZE/BANDAGES/DRESSINGS)
COVER SURGICAL LIGHT HANDLE (MISCELLANEOUS) IMPLANT
DECANTER SPIKE VIAL GLASS SM (MISCELLANEOUS) IMPLANT
DERMABOND ADVANCED (GAUZE/BANDAGES/DRESSINGS) ×2
DERMABOND ADVANCED .7 DNX12 (GAUZE/BANDAGES/DRESSINGS) ×1 IMPLANT
DRAPE LAPAROSCOPIC ABDOMINAL (DRAPES) ×3 IMPLANT
ELECT REM PT RETURN 9FT ADLT (ELECTROSURGICAL) ×3
ELECTRODE REM PT RTRN 9FT ADLT (ELECTROSURGICAL) ×1 IMPLANT
GLOVE BIOGEL M 8.0 STRL (GLOVE) ×3 IMPLANT
GLOVE BIOGEL PI IND STRL 7.0 (GLOVE) ×1 IMPLANT
GLOVE BIOGEL PI INDICATOR 7.0 (GLOVE) ×2
GOWN STRL REUS W/TWL LRG LVL3 (GOWN DISPOSABLE) IMPLANT
GOWN STRL REUS W/TWL XL LVL3 (GOWN DISPOSABLE) ×9 IMPLANT
KIT BASIN OR (CUSTOM PROCEDURE TRAY) ×3 IMPLANT
PENCIL BUTTON HOLSTER BLD 10FT (ELECTRODE) ×3 IMPLANT
SCALPEL HARMONIC ACE (MISCELLANEOUS) IMPLANT
SET IRRIG TUBING LAPAROSCOPIC (IRRIGATION / IRRIGATOR) IMPLANT
SLEEVE ENDOPATH XCEL 5M (ENDOMECHANICALS) ×3 IMPLANT
SLEEVE Z-THREAD 5X100MM (TROCAR) IMPLANT
SOLUTION ANTI FOG 6CC (MISCELLANEOUS) ×3 IMPLANT
SPONGE LAP 18X18 X RAY DECT (DISPOSABLE) ×3 IMPLANT
STRIP CLOSURE SKIN 1/2X4 (GAUZE/BANDAGES/DRESSINGS) IMPLANT
SUT VIC AB 4-0 SH 18 (SUTURE) ×3 IMPLANT
SYR 30ML LL (SYRINGE) IMPLANT
TRAY FOLEY CATH 14FRSI W/METER (CATHETERS) IMPLANT
TRAY LAP CHOLE (CUSTOM PROCEDURE TRAY) ×3 IMPLANT
TROCAR BLADELESS OPT 5 100 (ENDOMECHANICALS) IMPLANT
TROCAR XCEL NON-BLD 11X100MML (ENDOMECHANICALS) IMPLANT
TROCAR XCEL NON-BLD 5MMX100MML (ENDOMECHANICALS) ×3 IMPLANT
TROCAR XCEL UNIV SLVE 11M 100M (ENDOMECHANICALS) IMPLANT
TUBING INSUFFLATION 10FT LAP (TUBING) ×3 IMPLANT

## 2013-05-18 NOTE — Anesthesia Preprocedure Evaluation (Addendum)
Anesthesia Evaluation  Patient identified by MRN, date of birth, ID band Patient awake    Reviewed: Allergy & Precautions, H&P , NPO status , Patient's Chart, lab work & pertinent test results  Airway Mallampati: II TM Distance: >3 FB Neck ROM: Full    Dental no notable dental hx.    Pulmonary sleep apnea ,  breath sounds clear to auscultation  Pulmonary exam normal       Cardiovascular hypertension, negative cardio ROS  Rhythm:Regular Rate:Normal     Neuro/Psych negative neurological ROS  negative psych ROS   GI/Hepatic negative GI ROS, Neg liver ROS,   Endo/Other  diabetesHyperthyroidism   Renal/GU ESRF and DialysisRenal diseaseDialyzed Saturday. No SOB today.  negative genitourinary   Musculoskeletal negative musculoskeletal ROS (+)   Abdominal   Peds negative pediatric ROS (+)  Hematology negative hematology ROS (+)   Anesthesia Other Findings   Reproductive/Obstetrics negative OB ROS                          Anesthesia Physical Anesthesia Plan  ASA: III  Anesthesia Plan: General   Post-op Pain Management:    Induction: Intravenous  Airway Management Planned: Oral ETT  Additional Equipment:   Intra-op Plan:   Post-operative Plan: Extubation in OR  Informed Consent: I have reviewed the patients History and Physical, chart, labs and discussed the procedure including the risks, benefits and alternatives for the proposed anesthesia with the patient or authorized representative who has indicated his/her understanding and acceptance.   Dental advisory given  Plan Discussed with: CRNA  Anesthesia Plan Comments:         Anesthesia Quick Evaluation

## 2013-05-18 NOTE — Op Note (Signed)
Surgeon: Wenda LowMatt Delaila Nand, MD, FACS  Asst:  none  Anes:  general  Procedure: Laparoscopy, explantation of peritoneal dialysis catheter (three cuffs of Teflon securing this and previously spliced PD catheter)  Diagnosis: lapband over bypass, ESRD with failed PD on HD  Complications: none  EBL:   minimal cc  Description of Procedure:  The patient was taken to room 1 and given general anesthesia.  Prepped with PCMX and draped sterilely.  Timeout.  5 mm Optiview throught the right upper quadrant without difficulty.  Second port to the left of the umbilicus.  Lapband tubing free of bowel.  Lapband position looked good and fixed by hepatic adhesions had normal appearing lie.    In the right lower quadrant there was a white disc at the exit site from the PD cath from the abdomen.  The exit site at the skin was about 6 cm up in the right upper quadrant.  I localized this disc in a longitudinal incision that had healed in the right lower quadrant.  The PD catheter was explanted with some difficulty owing to the 3 teflon rings that secured the tube in the scar.  There was evidence of splicing from the first teflon ring to the second and third.  The tubing was explanted in toto.  The fascial incision was repaired with a fig of 8 of 0 vicryl with the tubing in place to facilitate closure.  The tubing was removed and the area was examined laparoscopically.  Exparel was injected into the incisions.  The skin was closed with 4-0 vicryl and Dermabond.   Randall B. Daphine DeutscherMartin, MD, [facility], [place] [phone]

## 2013-05-18 NOTE — Progress Notes (Signed)
Patient received from PACU, alert and oriented, no complaints of pain, abdomen tender, incisions present with skin glue applied clean dry and intact.  Patient has 2 fistuals in left arm (1 in lower forearm and 1 in upper arm), bruits present.  Patient ambulated to bathroom, voided/bm, SCD's applied,infusing IV right arm.

## 2013-05-18 NOTE — H&P (View-Only) (Signed)
Chief Complaint:  Intolerance of peritoneal dialysis catheter;  For removal  History of Present Illness:  Randall [name] is an 48 y.o. male who had been over bypass 4 years ago and who had a peritoneal bowels catheter inserted this past year. He was unable to tolerate that and recently had some swelling of the scrotum., Sounds like he may have had a communicating hydrocele as he reports after infusing saline his scrotum would get very large. It would go down when the fluid was taken off. He has CT scan which I reviewed which did not show any evidence of an inguinal hernia and no complicating factors within his abdominal wall. He was having trouble with his PD catheter leaking and they wanted to come out. He looks good but apparently they wanted a little more weight. He is on the renal transplant list at [facility].  Past Medical History  Diagnosis Date  . ESRD on hemodialysis     [facility] on MWF, ESRD due to DM/HTN  . Recurrent vomiting   . Hyperlipidemia   . Thyroid disorder     takes sensipar  . Diabetes mellitus     controlling by weight/diet  . 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 GERD (gastroesophageal reflux disease)   . Hypertension   . Hyperthyroidism     Past Surgical History  Procedure Laterality Date  . Gastric restriction surgery  12/06/09    lap band  . Dialysis fistula creation  2011  . Insertion of dialysis catheter  11/13/2011    Procedure: INSERTION OF DIALYSIS CATHETER;  Surgeon: Sherren Kerns, MD;  Location[facility] OR;  Service: Vascular;  Laterality: Right;  Insertion of 23cm dialysis catheter in right IJ  . Av fistula placement  12/05/2011    Procedure: ARTERIOVENOUS (AV) FISTULA CREATION;  Surgeon: Larina Earthly, MD;  Location: [facility] OR;  Service: Vascular;  Laterality: Left;  . Av fistula placement  01-23-2012    #1 ligation of 2 competing branches of left upper arm AVF   #2  superifical mobilization of left upper arm AVF     Current Outpatient Prescriptions   Medication Sig Dispense Refill  . calcitRIOL (ROCALTROL) 0.25 MCG capsule Take 1 capsule (0.25 mcg total) by mouth every evening.  15 capsule  0  . gabapentin (NEURONTIN) 300 MG capsule Take 300-600 mg by mouth 2 (two) times daily. 1 tablet in the morning, 2 tablets in the evening      . lanthanum (FOSRENOL) 1000 MG chewable tablet Chew 2 tablets (2,000 mg total) by mouth 3 (three) times daily with meals.  90 tablet  1  . SENSIPAR 60 MG tablet Take 60 mg by mouth at bedtime.       . simethicone (GAS-X) 80 MG chewable tablet Chew 1 tablet (80 mg total) by mouth every 6 (six) hours as needed for flatulence.  30 tablet  0   No current facility-administered medications for this visit.   Iohexol Family History  Problem Relation Age of Onset  . Hypertension Mother   . Diabetes Mother   . Hypertension Father   . Diabetes Father    Social History:   reports that he has never smoked. He has never used smokeless tobacco. He reports that he does not drink alcohol or use illicit drugs.   REVIEW OF SYSTEMS - PERTINENT POSITIVES ONLY: See old chart  Physical Exam:   Blood pressure 130/86, pulse 88, resp. rate 18, height 5\' 11"  (1.803 m), weight  274 lb (124.286 kg). Body mass index is 38.23 kg/(m^2).  Gen:  WDWN African American male NAD  Neurological: Alert and oriented to person, place, and time. Motor and sensory function is grossly intact  Head: Normocephalic and atraumatic.  Eyes: Conjunctivae are normal. Pupils are equal, round, and reactive to light. No scleral icterus.  Neck: Normal range of motion. Neck supple. No tracheal deviation or thyromegaly present.  Cardiovascular:  SR without murmurs or gallops.  No carotid bruits Respiratory: Effort normal.  No respiratory distress. No chest wall tenderness. Breath sounds normal.  No wheezes, rales or rhonchi.  Abdomen:  PD catheter and right upper quadrant. GU: Musculoskeletal: Normal range of motion. Extremities are nontender. No cyanosis,  edema or clubbing noted Lymphadenopathy: No cervical, preauricular, postauricular or axillary adenopathy is present Skin: Skin is warm and dry. No rash noted. No diaphoresis. No erythema. No pallor. Pscyh: Normal mood and affect. Behavior is normal. Judgment and thought content normal.   LABORATORY RESULTS: No results found for this or any previous visit (from the past 48 hour(s)).  RADIOLOGY RESULTS: No results found.  Problem List: Patient Active Problem List   Diagnosis Date Noted  . Elevated lactic acid level 02/21/2013  . Fitting and adjustment of gastric lap band 12/26/2012  . Abdominal pain 12/03/2012  . Unspecified constipation 09/06/2012  . Nausea and vomiting 04/30/2012  . Intractable nausea and vomiting 02/16/2012  . Vomiting 01/14/2012  . Hypertension 01/14/2012  . 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 ESRD (end stage renal disease) on dialysis 01/12/2011    Assessment & Plan: Laparoscopically guided removal of PD catheter.      Matt B. Daphine DeutscherMartin, MD, [facility]ACS  [facility] [phone] beeper [phone]  04/29/2013 3:04 PM

## 2013-05-18 NOTE — Transfer of Care (Signed)
Immediate Anesthesia Transfer of Care Note  Patient: Randall KhanJohn P [name]  Procedure(s) Performed: Procedure(s): LAPAROSCOPIC REMOVAL CONTINUOUS AMBULATORY PERITONEAL DIALYSIS  (CAPD) CATHETER (N/A)  Patient Location: PACU  Anesthesia Type:General  Level of Consciousness: awake, sedated and patient cooperative  Airway & Oxygen Therapy: Patient Spontanous Breathing and Patient connected to face mask oxygen  Post-op Assessment: Report given to PACU RN and Post -op Vital signs reviewed and stable  Post vital signs: Reviewed and stable  Complications: No apparent anesthesia complications

## 2013-05-18 NOTE — Interval H&P Note (Signed)
History and Physical Interval Note:  05/18/2013 12:21 PM  Randall KhanJohn P [name]  has presented today for surgery, with the diagnosis of un-needed PD cath  The various methods of treatment have been discussed with the patient and family. After consideration of risks, benefits and other options for treatment, the patient has consented to  Procedure(s): LAPAROSCOPIC REMOVAL CONTINUOUS AMBULATORY PERITONEAL DIALYSIS  (CAPD) CATHETER (N/A) as a surgical intervention .  The patient's history has been reviewed, patient examined, no change in status, stable for surgery.  I have reviewed the patient's chart and labs.  Questions were answered to the patient's satisfaction.     Masaji Billups B

## 2013-05-19 ENCOUNTER — Encounter (HOSPITAL_COMMUNITY): Payer: Self-pay | Admitting: Surgery

## 2013-05-19 LAB — GLUCOSE, CAPILLARY
Glucose-Capillary: 125 mg/dL — ABNORMAL HIGH (ref 70–99)
Glucose-Capillary: 119 mg/dL — ABNORMAL HIGH (ref 70–99)

## 2013-05-19 LAB — CBC
HEMATOCRIT: 35.1 % — AB (ref 39.0–52.0)
HEMOGLOBIN: 11.5 g/dL — AB (ref 13.0–17.0)
MCH: 31.3 pg (ref 26.0–34.0)
MCHC: 32.8 g/dL (ref 30.0–36.0)
MCV: 95.4 fL (ref 78.0–100.0)
Platelets: 153 10*3/uL (ref 150–400)
RBC: 3.68 MIL/uL — ABNORMAL LOW (ref 4.22–5.81)
RDW: 14.2 % (ref 11.5–15.5)
WBC: 8.2 10*3/uL (ref 4.0–10.5)

## 2013-05-19 LAB — BASIC METABOLIC PANEL
BUN: 54 mg/dL — AB (ref 6–23)
CALCIUM: 7.3 mg/dL — AB (ref 8.4–10.5)
CO2: 19 mEq/L (ref 19–32)
CREATININE: 14.39 mg/dL — AB (ref 0.50–1.35)
Chloride: 95 mEq/L — ABNORMAL LOW (ref 96–112)
GFR, EST AFRICAN AMERICAN: 4 mL/min — AB (ref >90)
GFR, EST NON AFRICAN AMERICAN: 3 mL/min — AB (ref >90)
GLUCOSE: 104 mg/dL — AB (ref 70–99)
Potassium: 4.3 mEq/L (ref 3.7–5.3)
Sodium: 137 mEq/L (ref 137–147)

## 2013-05-19 NOTE — Anesthesia Postprocedure Evaluation (Signed)
  Anesthesia Post-op Note  Patient: Randall KhanJohn P [name]  Procedure(s) Performed: Procedure(s) (LRB): LAPAROSCOPIC REMOVAL CONTINUOUS AMBULATORY PERITONEAL DIALYSIS  (CAPD) CATHETER (N/A)  Patient Location: PACU  Anesthesia Type: General  Level of Consciousness: awake and alert   Airway and Oxygen Therapy: Patient Spontanous Breathing  Post-op Pain: mild  Post-op Assessment: Post-op Vital signs reviewed, Patient's Cardiovascular Status Stable, Respiratory Function Stable, Patent Airway and No signs of Nausea or vomiting  Last Vitals:  Filed Vitals:   05/19/13 0700  BP: 117/71  Pulse: 88  Temp: 36.3 C  Resp: 20    Post-op Vital Signs: stable   Complications: No apparent anesthesia complications

## 2013-05-19 NOTE — Discharge Summary (Signed)
Physician Discharge Summary  Patient ID: Randall [name] MRN: [id number] DOB/AGE: 48/04/1965 48 y.o.  Admit date: 05/18/2013 Discharge date: 05/19/2013  Admission Diagnoses:  ESRD with PD cath that is nonfunctional.  Lapband over bypass.  On renal transplant list  Discharge Diagnoses:  same  Active Problems:   Renal failure   Surgery:  Laparoscopy and removal of PD catheter  Discharged Condition: improved  Hospital Course:   Had surgery.  Kept in overnight observation.  Home today to go to outpatient HD.   Consults: none  Significant Diagnostic Studies: none    Discharge Exam: Blood pressure 117/71, pulse 88, temperature 97.4 F (36.3 C), temperature source Oral, resp. rate 20, height 5\' 11"  (1.803 m), weight 273 lb (123.832 kg), SpO2 95.00%. Incisions ok.  Pain controlled.  Disposition: 01-Home or Self Care  Discharge Orders   Future Orders Complete By Expires   Diet - low sodium heart healthy  As directed    Discharge wound care:  As directed    Comments:     May shower   Increase activity slowly  As directed        Medication List         gabapentin 300 MG capsule  Commonly known as:  NEURONTIN  Take 600 mg by mouth 2 (two) times daily.     lanthanum 1000 MG chewable tablet  Commonly known as:  FOSRENOL  Chew 2,000-3,000 mg by mouth See admin instructions. Takes 3 tablets with meals and 2 tablets with snacks     lanthanum 1000 MG chewable tablet  Commonly known as:  FOSRENOL  Chew 2 tablets (2,000 mg total) by mouth 3 (three) times daily with meals.     loperamide 2 MG capsule  Commonly known as:  IMODIUM  Take 2 mg by mouth as needed for diarrhea or loose stools.     SENSIPAR 60 MG tablet  Generic drug:  cinacalcet  Take 60 mg by mouth at bedtime.           Follow-up Information   Follow up with Luretha MurphyMARTIN,Darrly Loberg B, MD In 3 weeks.   Specialty:  General Surgery   Contact information:   [address] [place]  [id number] [phone]       Signed: Valarie MerinoMARTIN,Vasily Fedewa B 05/19/2013, 9:48 AM

## 2013-05-19 NOTE — Discharge Instructions (Signed)
Hemodialysis °Dialysis is a procedure that replaces some of the work that healthy kidneys do. It is done when you lose about 85 90% of your kidney function and sometimes earlier if your symptoms may be improved by dialysis. During dialysis, wastes, salt, and extra water are removed from the blood; and the level of certain chemicals in the blood (such as potassium) is maintained. Hemodialysis is a type of dialysis in which a machine called a dialyzer is used to filter the blood. Hemodialysis is usually performed by a caregiver at a hospital or dialysis center 3 times a week for 3 5 hours during each visit. It may also be performed more frequently and for longer periods of time at home with training. °LET YOUR CAREGIVER KNOW ABOUT: °· Any allergies you have. °· All medications you are taking, including vitamins, herbs, eyedrops, and over-the-counter medications and creams. °· Any blood disorders you have had. °· Other health problems you have. °RISKS AND COMPLICATIONS °Generally, hemodialysis is a safe procedure. However, as with any procedure, complications can occur. Possible complications include: °· Low blood pressure (hypotension). °· Narrowing or ballooning of a blood vessel or bleeding at the site where blood is removed from the body and returned to the body (vascular access). °· An infection or blockage at the vascular access site. °BEFORE THE PROCEDURE °Before having hemodialysis for the first time, you will need surgery to create a site where blood can be removed from the body and returned to the body (vascular access). There are three types of vascular accesses: °· Arteriovenous fistula. This type of access is created when an artery and a vein (usually in the arm) are connected during surgery. The arteriovenous fistula takes 1 6 months to develop after surgery. °· Arteriovenous graft. This type of access is created when an artery and a vein in the arm are connected during surgery with a tube. An  arteriovenous graft can be used within 2 3 weeks of surgery. °· A venous catheter. To create this type of access, a thin, flexible tube (catheter) is placed in a large vein in your neck, chest, or groin. A venous catheter can be used right away. °PROCEDURE  °Hemodialysis is performed while you are sitting or reclining. During the procedure, you may sleep, read, watch television, or perform any other tasks that can be done while you are in this position. If you experience side effects or any other discomfort during the procedure, let your caregiver know. He or she may be able to make adjustments to help you feel more comfortable. °Your hemodialysis process may look something like this: °1. Your weight, blood pressure, pulse, and temperature will be measured. °2. The skin around your vascular access will be sterilized. °3. Your vascular access will be connected to the dialyzer. If your access is a fistula or graft, two needles will be inserted through the vascular access. The needles will be connected to a plastic tube that is connected to the dialyzer. They will be taped to your skin so that they do not move out of place. If your access is a catheter, it will be connected to a plastic tube that is connected to the dialyzer. °4. The dialysis machine will be turned on. This will cause your blood to flow to the dialyzer. The dialyzer will filter and clean your blood before returning it to your body. While the dialysis machine is working, your blood pressure and pulse will be checked several times. °5. Once dialysis is complete, your vascular   access will be disconnected from the dialyzer. If your access is a fistula or graft, the needles will be removed and a bandage (dressing) will be placed over the access. °AFTER THE PROCEDURE °· Your weight will be measured. °· Your blood may be tested to see whether your treatments are removing enough wastes. This is usually done once a month. °· You may experience or continue to  experience side effects, including: °· Dizziness. °· Muscle cramps. °· Nausea. °· Headaches. °· Feeling tired. Energy levels usually return to normal the day after the procedure. °· Itchiness. Your caregiver may be able to prescribe medication to relieve it. °· Achy or jittery legs. You may feel like kicking your legs. This sometimes causes sleeping problems. °· Allergic reaction to the chemicals used to sterilize the dialyzer. °Document Released: 06/23/2012 Document Reviewed: 06/23/2012 °Exit[employer]® Patient Information ©2014 [employer]. ° °

## 2013-05-26 ENCOUNTER — Encounter (HOSPITAL_COMMUNITY): Payer: Self-pay | Admitting: Emergency Medicine

## 2013-05-26 ENCOUNTER — Inpatient Hospital Stay (HOSPITAL_COMMUNITY): Payer: Medicare Other

## 2013-05-26 ENCOUNTER — Inpatient Hospital Stay (HOSPITAL_COMMUNITY)
Admission: EM | Admit: 2013-05-26 | Discharge: 2013-05-31 | DRG: 871 | Disposition: A | Discharge status: Home / Self Care | Payer: Medicare Other | Attending: Internal Medicine | Admitting: Internal Medicine

## 2013-05-26 ENCOUNTER — Emergency Department (HOSPITAL_COMMUNITY): Payer: Medicare Other

## 2013-05-26 DIAGNOSIS — M949 Disorder of cartilage, unspecified: Secondary | ICD-10-CM

## 2013-05-26 DIAGNOSIS — G5690 Unspecified mononeuropathy of unspecified upper limb: Secondary | ICD-10-CM | POA: Diagnosis present

## 2013-05-26 DIAGNOSIS — Z79899 Other long term (current) drug therapy: Secondary | ICD-10-CM

## 2013-05-26 DIAGNOSIS — E119 Type 2 diabetes mellitus without complications: Secondary | ICD-10-CM | POA: Diagnosis present

## 2013-05-26 DIAGNOSIS — E785 Hyperlipidemia, unspecified: Secondary | ICD-10-CM | POA: Diagnosis present

## 2013-05-26 DIAGNOSIS — Z833 Family history of diabetes mellitus: Secondary | ICD-10-CM

## 2013-05-26 DIAGNOSIS — Z418 Encounter for other procedures for purposes other than remedying health state: Secondary | ICD-10-CM

## 2013-05-26 DIAGNOSIS — R509 Fever, unspecified: Secondary | ICD-10-CM

## 2013-05-26 DIAGNOSIS — K5900 Constipation, unspecified: Secondary | ICD-10-CM

## 2013-05-26 DIAGNOSIS — R7989 Other specified abnormal findings of blood chemistry: Secondary | ICD-10-CM

## 2013-05-26 DIAGNOSIS — Z992 Dependence on renal dialysis: Secondary | ICD-10-CM

## 2013-05-26 DIAGNOSIS — A419 Sepsis, unspecified organism: Principal | ICD-10-CM

## 2013-05-26 DIAGNOSIS — R112 Nausea with vomiting, unspecified: Secondary | ICD-10-CM

## 2013-05-26 DIAGNOSIS — I120 Hypertensive chronic kidney disease with stage 5 chronic kidney disease or end stage renal disease: Secondary | ICD-10-CM | POA: Diagnosis present

## 2013-05-26 DIAGNOSIS — Z2989 Encounter for other specified prophylactic measures: Secondary | ICD-10-CM

## 2013-05-26 DIAGNOSIS — R197 Diarrhea, unspecified: Secondary | ICD-10-CM

## 2013-05-26 DIAGNOSIS — I10 Essential (primary) hypertension: Secondary | ICD-10-CM

## 2013-05-26 DIAGNOSIS — Z8249 Family history of ischemic heart disease and other diseases of the circulatory system: Secondary | ICD-10-CM

## 2013-05-26 DIAGNOSIS — N19 Unspecified kidney failure: Secondary | ICD-10-CM

## 2013-05-26 DIAGNOSIS — R1110 Vomiting, unspecified: Secondary | ICD-10-CM

## 2013-05-26 DIAGNOSIS — D649 Anemia, unspecified: Secondary | ICD-10-CM | POA: Diagnosis present

## 2013-05-26 DIAGNOSIS — M899 Disorder of bone, unspecified: Secondary | ICD-10-CM | POA: Diagnosis present

## 2013-05-26 DIAGNOSIS — N186 End stage renal disease: Secondary | ICD-10-CM

## 2013-05-26 DIAGNOSIS — Z9884 Bariatric surgery status: Secondary | ICD-10-CM

## 2013-05-26 DIAGNOSIS — I959 Hypotension, unspecified: Secondary | ICD-10-CM

## 2013-05-26 DIAGNOSIS — N2581 Secondary hyperparathyroidism of renal origin: Secondary | ICD-10-CM | POA: Diagnosis present

## 2013-05-26 DIAGNOSIS — A0472 Enterocolitis due to Clostridium difficile, not specified as recurrent: Secondary | ICD-10-CM

## 2013-05-26 DIAGNOSIS — R109 Unspecified abdominal pain: Secondary | ICD-10-CM

## 2013-05-26 DIAGNOSIS — Z4651 Encounter for fitting and adjustment of gastric lap band: Secondary | ICD-10-CM

## 2013-05-26 LAB — BASIC METABOLIC PANEL
BUN: 20 mg/dL (ref 6–23)
CALCIUM: 8.8 mg/dL (ref 8.4–10.5)
CO2: 27 mEq/L (ref 19–32)
CREATININE: 8.67 mg/dL — AB (ref 0.50–1.35)
Chloride: 89 mEq/L — ABNORMAL LOW (ref 96–112)
GFR calc non Af Amer: 6 mL/min — ABNORMAL LOW (ref >90)
GFR, EST AFRICAN AMERICAN: 7 mL/min — AB (ref >90)
GLUCOSE: 117 mg/dL — AB (ref 70–99)
POTASSIUM: 3.7 meq/L (ref 3.7–5.3)
Sodium: 135 mEq/L — ABNORMAL LOW (ref 137–147)

## 2013-05-26 LAB — I-STAT TROPONIN, ED: Troponin i, poc: 0.01 ng/mL (ref 0.00–0.08)

## 2013-05-26 LAB — CBC
HEMATOCRIT: 39.5 % (ref 39.0–52.0)
HEMOGLOBIN: 13 g/dL (ref 13.0–17.0)
MCH: 31.3 pg (ref 26.0–34.0)
MCHC: 32.9 g/dL (ref 30.0–36.0)
MCV: 95.2 fL (ref 78.0–100.0)
Platelets: 150 10*3/uL (ref 150–400)
RBC: 4.15 MIL/uL — ABNORMAL LOW (ref 4.22–5.81)
RDW: 14.6 % (ref 11.5–15.5)
WBC: 10.7 10*3/uL — ABNORMAL HIGH (ref 4.0–10.5)

## 2013-05-26 LAB — I-STAT CG4 LACTIC ACID, ED: Lactic Acid, Venous: 1.91 mmol/L (ref 0.5–2.2)

## 2013-05-26 MED ORDER — PIPERACILLIN-TAZOBACTAM IN DEX 2-0.25 GM/50ML IV SOLN
2.2500 g | INTRAVENOUS | Status: AC
  Administered 2013-05-26: 2.25 g via INTRAVENOUS
  Filled 2013-05-26: qty 50

## 2013-05-26 MED ORDER — PREGABALIN 50 MG PO CAPS
50.0000 mg | ORAL_CAPSULE | Freq: Every day | ORAL | Status: DC
  Administered 2013-05-26 – 2013-05-27 (×2): 50 mg via ORAL
  Administered 2013-05-28: 23:00:00 via ORAL
  Administered 2013-05-29 – 2013-05-30 (×2): 50 mg via ORAL
  Filled 2013-05-26 (×5): qty 1

## 2013-05-26 MED ORDER — ACETAMINOPHEN 650 MG RE SUPP: 650.0000 mg | Freq: Four times a day (QID) | RECTAL | Status: DC | PRN

## 2013-05-26 MED ORDER — LANTHANUM CARBONATE 500 MG PO CHEW
2000.0000 mg | CHEWABLE_TABLET | ORAL | Status: DC | PRN
  Filled 2013-05-26: qty 4

## 2013-05-26 MED ORDER — SODIUM CHLORIDE 0.9 % IV SOLN: 1000.0000 mL | Freq: Once | INTRAVENOUS | Status: DC

## 2013-05-26 MED ORDER — ACETAMINOPHEN 325 MG PO TABS
650.0000 mg | ORAL_TABLET | Freq: Four times a day (QID) | ORAL | Status: DC | PRN
  Administered 2013-05-27: 650 mg via ORAL
  Filled 2013-05-26: qty 2

## 2013-05-26 MED ORDER — SODIUM CHLORIDE 0.9 % IV BOLUS (SEPSIS)
1000.0000 mL | Freq: Once | INTRAVENOUS | Status: AC
  Administered 2013-05-26: 1000 mL via INTRAVENOUS

## 2013-05-26 MED ORDER — SODIUM CHLORIDE 0.9 % IJ SOLN
3.0000 mL | Freq: Two times a day (BID) | INTRAMUSCULAR | Status: DC
  Administered 2013-05-27 – 2013-05-31 (×7): 3 mL via INTRAVENOUS

## 2013-05-26 MED ORDER — HEPARIN SODIUM (PORCINE) 5000 UNIT/ML IJ SOLN
5000.0000 [IU] | Freq: Three times a day (TID) | INTRAMUSCULAR | Status: DC
  Administered 2013-05-27 – 2013-05-31 (×11): 5000 [IU] via SUBCUTANEOUS
  Filled 2013-05-26 (×17): qty 1

## 2013-05-26 MED ORDER — SODIUM CHLORIDE 0.9 % IV SOLN
1000.0000 mL | Freq: Once | INTRAVENOUS | Status: AC
  Administered 2013-05-26: 1000 mL via INTRAVENOUS

## 2013-05-26 MED ORDER — LANTHANUM CARBONATE 500 MG PO CHEW
3000.0000 mg | CHEWABLE_TABLET | Freq: Three times a day (TID) | ORAL | Status: DC
  Administered 2013-05-27 – 2013-05-31 (×10): 3000 mg via ORAL
  Filled 2013-05-26 (×16): qty 6

## 2013-05-26 MED ORDER — SODIUM CHLORIDE 0.9 % IV SOLN: 1000.0000 mL | INTRAVENOUS | Status: DC

## 2013-05-26 MED ORDER — PIPERACILLIN-TAZOBACTAM 3.375 G IVPB 30 MIN: 3.3750 g | Freq: Once | INTRAVENOUS | Status: DC

## 2013-05-26 MED ORDER — PIPERACILLIN-TAZOBACTAM IN DEX 2-0.25 GM/50ML IV SOLN
2.2500 g | Freq: Three times a day (TID) | INTRAVENOUS | Status: DC
  Administered 2013-05-27 (×2): 2.25 g via INTRAVENOUS
  Filled 2013-05-26 (×6): qty 50

## 2013-05-26 MED ORDER — ALBUTEROL SULFATE (2.5 MG/3ML) 0.083% IN NEBU: 2.5000 mg | INHALATION_SOLUTION | RESPIRATORY_TRACT | Status: DC | PRN

## 2013-05-26 MED ORDER — VANCOMYCIN HCL 10 G IV SOLR
2000.0000 mg | INTRAVENOUS | Status: AC
  Administered 2013-05-26: 2000 mg via INTRAVENOUS
  Filled 2013-05-26: qty 2000

## 2013-05-26 MED ORDER — ONDANSETRON HCL 4 MG PO TABS: 4.0000 mg | ORAL_TABLET | Freq: Four times a day (QID) | ORAL | Status: DC | PRN

## 2013-05-26 MED ORDER — LANTHANUM CARBONATE 500 MG PO CHEW
2000.0000 mg | CHEWABLE_TABLET | ORAL | Status: DC
Start: 2013-05-26 — End: 2013-05-26

## 2013-05-26 MED ORDER — ACETAMINOPHEN 500 MG PO TABS
1000.0000 mg | ORAL_TABLET | Freq: Once | ORAL | Status: AC
  Administered 2013-05-26: 1000 mg via ORAL
  Filled 2013-05-26: qty 2

## 2013-05-26 MED ORDER — ONDANSETRON HCL 4 MG/2ML IJ SOLN: 4.0000 mg | Freq: Four times a day (QID) | INTRAMUSCULAR | Status: DC | PRN

## 2013-05-26 MED ORDER — CINACALCET HCL 30 MG PO TABS
60.0000 mg | ORAL_TABLET | Freq: Every day | ORAL | Status: DC
  Administered 2013-05-27 – 2013-05-30 (×4): 60 mg via ORAL
  Filled 2013-05-26 (×5): qty 2

## 2013-05-26 MED ORDER — METRONIDAZOLE 500 MG PO TABS
500.0000 mg | ORAL_TABLET | Freq: Three times a day (TID) | ORAL | Status: DC
  Administered 2013-05-26 – 2013-05-27 (×4): 500 mg via ORAL
  Filled 2013-05-26 (×6): qty 1

## 2013-05-26 NOTE — ED Notes (Signed)
Report given to [facility]. 

## 2013-05-26 NOTE — Progress Notes (Signed)
ANTIBIOTIC CONSULT NOTE - INITIAL  Pharmacy Consult for vancomycin, Zosyn Indication: rule out sepsis  Allergies  Allergen Reactions  . Iohexol Itching    Pt had severe itching to head,face,upper chest after 80 ml Omni 300 administered.No obvious hives. I spoke to Dr Ruffin FrederickPop, and he didn't feel the need for Benadryl to be given.  KR    Patient Measurements: Height: 5' 10.87" (180 cm) Weight: 272 lb 14.9 oz (123.8 kg) IBW/kg (Calculated) : 74.99 Adjusted Body Weight: 93 kg  Vital Signs: Temp: 102.7 F (39.3 C) (03/17 1337) Temp src: Rectal (03/17 1337) BP: 112/73 mmHg (03/17 1400) Pulse Rate: 113 (03/17 1400) Intake/Output from previous day:   Intake/Output from this shift:    Labs: No results found for this basename: WBC, HGB, PLT, LABCREA, CREATININE,  in the last 72 hours Estimated Creatinine Clearance: 8.4 ml/min (by C-G formula based on Cr of 14.39). No results found for this basename: VANCOTROUGH, VANCOPEAK, VANCORANDOM, GENTTROUGH, GENTPEAK, GENTRANDOM, TOBRATROUGH, TOBRAPEAK, TOBRARND, AMIKACINPEAK, AMIKACINTROU, AMIKACIN,  in the last 72 hours   Microbiology: No results found for this or any previous visit (from the past 720 hour(s)).  Medical History: Past Medical History  Diagnosis Date  . Hyperlipidemia   . Thyroid disorder     takes sensipar  . 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 Hyperthyroidism   . Hypertension     hx of off bp meds since 2012  . Sleep apnea     hx of sleep apnea prior to weight loss in 2012  . Diabetes mellitus     controlling by weight/diet  . ESRD on hemodialysis march 2012    [facility], ESRD due to DM/HTN  . Peritoneal dialysis status     oct 2014 to dec 2014    Medications:  Scheduled:  . acetaminophen  1,000 mg Oral Once   Infusions:  . sodium chloride     Followed by  . sodium chloride     Followed by  . sodium chloride     Assessment: 48 yo male currently on dialysis presented to ER today with  hypotension while at dialysis center today. Patient had been complaining of diarrhea and dizziness last 3 days and recently had peritoneal catheter removed as well. To start vancomycin and Zosyn per pharmacy dosing for possible sepsis.   Goal of Therapy:  Target Peak in HD = 25 mcg/mL, Target pre-HD level 15-25, Target post-HD level 5-15  Plan:  1) Per protocol, will use adjusted weight of 93kg and multiply x 0.9 L/kg x target peak of 25 mcg/mL = 2g vancomycin loading dose 2) Zosyn dosing per HD recommendations is 2.25g IV q8 3) Check vanc levels as needed according to protocol   Hessie KnowsJustin M Torrance Stockley, PharmD, BCPS Pager [phone] 05/26/2013 2:31 PM

## 2013-05-26 NOTE — ED Notes (Signed)
[facility] dialysis.  Patient has had diarrhea and dizziness for the last three days and in the last week had a peritoneal catheter removed.  His kidney doctor thinks he might have sepsis from this procedure.  Patient c/o general body aches but has not had fever, nausea, or vomiting.

## 2013-05-26 NOTE — Progress Notes (Signed)
Subjective: His is POD #8 from laparoscopic removal of CAPD.  He developed fever and diarrhea and presented to the ED for evaluation.  CT demonstrated a fluid collection in the anterior abdominal wall and we were asked to see him because of this.  He denies any incisional pain or problems with the incisions.  Objective: Vital signs in last 24 hours: Temp:  [98.5 F (36.9 C)-102.7 F (39.3 C)] 102.7 F (39.3 C) (03/17 1337) Pulse Rate:  [110-114] 110 (03/17 1530) Resp:  [21] 21 (03/17 1323) BP: (83-138)/(60-73) 128/69 mmHg (03/17 1530) SpO2:  [93 %-97 %] 93 % (03/17 1530) Weight:  [272 lb 14.9 oz (123.8 kg)] 272 lb 14.9 oz (123.8 kg) (03/17 1339)    Intake/Output from previous day:   Intake/Output this shift:    PE: General- Overweight male in NAD Abdomen-soft, surgical incisions are clean and intact with no erythema or tenderness; slight fullness deep to vertical right paramedian incision.  Lab Results:   Recent Labs  05/26/13 1410  WBC 10.7*  HGB 13.0  HCT 39.5  PLT 150   BMET  Recent Labs  05/26/13 1410  NA 135*  K 3.7  CL 89*  CO2 27  GLUCOSE 117*  BUN 20  CREATININE 8.67*  CALCIUM 8.8   PT/INR No results found for this basename: LABPROT, INR,  in the last 72 hours Comprehensive Metabolic Panel:    Component Value Date/Time   NA 135* 05/26/2013 1410   NA 137 05/19/2013 0600   K 3.7 05/26/2013 1410   K 4.3 05/19/2013 0600   CL 89* 05/26/2013 1410   CL 95* 05/19/2013 0600   CO2 27 05/26/2013 1410   CO2 19 05/19/2013 0600   BUN 20 05/26/2013 1410   BUN 54* 05/19/2013 0600   CREATININE 8.67* 05/26/2013 1410   CREATININE 14.39* 05/19/2013 0600   GLUCOSE 117* 05/26/2013 1410   GLUCOSE 104* 05/19/2013 0600   CALCIUM 8.8 05/26/2013 1410   CALCIUM 7.3* 05/19/2013 0600   CALCIUM 7.4* 05/26/2010 0921   AST 20 02/21/2013 0944   AST 27 02/11/2013 0840   ALT 17 02/21/2013 0944   ALT 24 02/11/2013 0840   ALKPHOS 108 02/21/2013 0944   ALKPHOS 115 02/11/2013 0840   BILITOT 0.6 02/21/2013 0944   BILITOT 0.6 02/11/2013 0840   PROT 8.8* 02/21/2013 0944   PROT 8.7* 02/11/2013 0840   ALBUMIN 3.4* 02/23/2013 0727   ALBUMIN 3.9 02/21/2013 0944     Studies/Results: Ct Abdomen Pelvis Wo Contrast  05/26/2013   CLINICAL DATA:  Diarrhea, hypotension, end-stage renal disease on dialysis, fever, recent peritoneal dialysis catheter removal, past history diabetes, hypertension  EXAM: CT ABDOMEN AND PELVIS WITHOUT CONTRAST  TECHNIQUE: Multidetector CT imaging of the abdomen and pelvis was performed following the standard protocol without intravenous contrast. Neither oral nor intravenous contrast utilized to history of contrast allergy.  COMPARISON:  04/03/2013  FINDINGS: Lung bases clear.  Prior laparoscopic gastric banding with normal angle of band on scout image and coronal view.  Post cholecystectomy.  Within limits of a nonenhanced exam, no focal abnormalities of the liver, spleen, and pancreas, kidneys, or adrenal glands.  Scattered atherosclerotic calcification aorta and coronary arteries.  Small umbilical hernia containing fat.  Small subcutaneous fluid collection in the anterior right abdomen, 4.3 x 3.3 x 4.9 cm, could represent edema, hemorrhage or infection, question related to site of prior peritoneal dialysis catheter removal.  High attenuation material in distal colon of uncertain etiology question radiodense medication.  Bowel loops  grossly unremarkable for technique.  No mass, adenopathy, free intraperitoneal fluid, or free air.  No acute osseous findings.  IMPRESSION: Small umbilical hernia.  Small subcutaneous fluid collection in right anterior abdominal wall 4.3 x 3.3 x 4.9 cm in size, question related to removal of prior peritoneal dialysis catheter, could represent edema, hemorrhage or infection.  No acute intra-abdominal or intrapelvic abnormalities.   Electronically Signed   By: Ulyses SouthwardMark  Boles M.D.   On: 05/26/2013 16:11   Dg Chest Portable 1 View  05/26/2013    CLINICAL DATA:  Dizziness, chills and fever for 2 days, history hypertension, diabetes, end-stage renal disease on dialysis  EXAM: PORTABLE CHEST - 1 VIEW  COMPARISON:  Portable exam 1401 hr compared to 02/21/2013  FINDINGS: Normal heart size, mediastinal contours, and pulmonary vascularity.  Lungs clear.  No pleural effusion or pneumothorax.  Bones unremarkable.  Vascular stent at the left subclavian vessels again noted.  IMPRESSION: No acute abnormalities.   Electronically Signed   By: Ulyses SouthwardMark  Boles M.D.   On: 05/26/2013 14:11    Anti-infectives: Anti-infectives   Start     Dose/Rate Route Frequency Ordered Stop   05/27/13 0000  piperacillin-tazobactam (ZOSYN) IVPB 2.25 g     2.25 g 100 mL/hr over 30 Minutes Intravenous Every 8 hours 05/26/13 1434     05/26/13 1445  piperacillin-tazobactam (ZOSYN) IVPB 2.25 g     2.25 g 100 mL/hr over 30 Minutes Intravenous STAT 05/26/13 1432 05/26/13 1613   05/26/13 1445  vancomycin (VANCOCIN) 2,000 mg in sodium chloride 0.9 % 500 mL IVPB     2,000 mg 250 mL/hr over 120 Minutes Intravenous STAT 05/26/13 1432 05/27/13 1445   05/26/13 1415  piperacillin-tazobactam (ZOSYN) IVPB 3.375 g  Status:  Discontinued     3.375 g 100 mL/hr over 30 Minutes Intravenous  Once 05/26/13 1411 05/26/13 1422      Assessment Post operative seroma with no evidence of infection.    LOS: 0 days   Plan: Please call us if we can be of further assistance.   Virgilia Quigg J 05/26/2013

## 2013-05-26 NOTE — ED Provider Notes (Signed)
CSN: [id number]     Arrival date & time 05/26/13  1321 History   First MD Initiated Contact with Patient 05/26/13 1322     Chief Complaint  Patient presents with  . Hypotension     (Consider location/radiation/quality/duration/timing/severity/associated sxs/prior Treatment) HPI Comments: Diarrhea all weekend. Has hypotension at dialysis. Recent peritoneal dialysis catheter removal, no abdominal pain.   Patient is a 48 y.o. male presenting with dizziness. The history is provided by the patient.  Dizziness Quality:  Lightheadedness Severity:  Moderate Onset quality:  Gradual Timing:  Constant Progression:  Improving Chronicity:  New Context: not with ear pain, not with eye movement, not with inactivity and not with medication   Context comment:  During dialysis Relieved by:  Nothing Worsened by:  Nothing tried Associated symptoms: diarrhea   Associated symptoms: no chest pain, no nausea, no shortness of breath and no vomiting     Past Medical History  Diagnosis Date  . Hyperlipidemia   . Thyroid disorder     takes sensipar  . 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 Hyperthyroidism   . Hypertension     hx of off bp meds since 2012  . Sleep apnea     hx of sleep apnea prior to weight loss in 2012  . Diabetes mellitus     controlling by weight/diet  . ESRD on hemodialysis march 2012    [facility] on MWF, ESRD due to DM/HTN  . Peritoneal dialysis status     oct 2014 to dec 2014   Past Surgical History  Procedure Laterality Date  . Gastric restriction surgery  12/06/09    lap band  . Dialysis fistula creation  2011  . Insertion of dialysis catheter  11/13/2011    Procedure: INSERTION OF DIALYSIS CATHETER;  Surgeon: Sherren Kerns, MD;  Location: [facility] OR;  Service: Vascular;  Laterality: Right;  Insertion of 23cm dialysis catheter in right IJ  . Av fistula placement  12/05/2011    Procedure: ARTERIOVENOUS (AV) FISTULA CREATION;  Surgeon: Larina Earthly, MD;  Location: [facility]  OR;  Service: Vascular;  Laterality: Left;  . Av fistula placement  01-23-2012    #1 ligation of 2 competing branches of left upper arm AVF   #2  superifical mobilization of left upper arm AVF   . Capd removal N/A 05/18/2013    Procedure: LAPAROSCOPIC REMOVAL CONTINUOUS AMBULATORY PERITONEAL DIALYSIS  (CAPD) CATHETER;  Surgeon: Valarie Merino, MD;  Location: [facility] ORS;  Service: General;  Laterality: N/A;   Family History  Problem Relation Age of Onset  . Hypertension Mother   . Diabetes Mother   . Hypertension Father   . Diabetes Father    History  Substance Use Topics  . Smoking status: Never Smoker   . Smokeless tobacco: Never Used  . Alcohol Use: No    Review of Systems  Constitutional: Positive for fever. Negative for chills.  Respiratory: Negative for cough and shortness of breath.   Cardiovascular: Negative for chest pain and leg swelling.  Gastrointestinal: Positive for diarrhea. Negative for nausea, vomiting and abdominal pain.  Neurological: Positive for dizziness.  All other systems reviewed and are negative.      Allergies  Iohexol  Home Medications   Current Outpatient Rx  Name  Route  Sig  Dispense  Refill  . acetaminophen (TYLENOL) 325 MG tablet   Oral   Take 650 mg by mouth every 6 (six) hours as needed for moderate pain.         Marland Kitchen  ibuprofen (ADVIL,MOTRIN) 200 MG tablet   Oral   Take 400 mg by mouth every 6 (six) hours as needed for moderate pain.         Marland Kitchen lanthanum (FOSRENOL) 1000 MG chewable tablet   Oral   Chew 2,000-3,000 mg by mouth See admin instructions. Takes 3 tablets with meals and 2 tablets with snacks         . loperamide (IMODIUM) 2 MG capsule   Oral   Take 2 mg by mouth as needed for diarrhea or loose stools.         Marland Kitchen loratadine (CLARITIN) 10 MG tablet   Oral   Take 10 mg by mouth daily as needed for allergies.         . pregabalin (LYRICA) 50 MG capsule   Oral   Take 50 mg by mouth at bedtime.         . SENSIPAR  60 MG tablet   Oral   Take 60 mg by mouth at bedtime.          . white petrolatum (VASELINE) GEL   Topical   Apply 1 application topically as needed for dry skin (on feet).          BP 138/67  Pulse 113  Temp(Src) 102.7 F (39.3 C) (Rectal)  Resp 21  Ht 5' 10.87" (1.8 m)  Wt 272 lb 14.9 oz (123.8 kg)  BMI 38.21 kg/m2  SpO2 97% Physical Exam  Nursing note and vitals reviewed. Constitutional: He is oriented to person, place, and time. He appears well-developed and well-nourished. No distress.  HENT:  Head: Normocephalic and atraumatic.  Mouth/Throat: No oropharyngeal exudate.  Eyes: EOM are normal. Pupils are equal, round, and reactive to light.  Neck: Normal range of motion. Neck supple.  Cardiovascular: Normal rate and regular rhythm.  Exam reveals no friction rub.   No murmur heard. Pulmonary/Chest: Effort normal and breath sounds normal. No respiratory distress. He has no wheezes. He has no rales.  Abdominal: He exhibits no distension. There is no tenderness. There is no rebound and no guarding.  Musculoskeletal: Normal range of motion. He exhibits no edema.  Neurological: He is alert and oriented to person, place, and time. He exhibits normal muscle tone.  Skin: He is not diaphoretic.    ED Course  Procedures (including critical care time) Labs Review Labs Reviewed  CBC - Abnormal; Notable for the following:    WBC 10.7 (*)    RBC 4.15 (*)    All other components within normal limits  BASIC METABOLIC PANEL - Abnormal; Notable for the following:    Sodium 135 (*)    Chloride 89 (*)    Glucose, Bld 117 (*)    Creatinine, Ser 8.67 (*)    GFR calc non Af Amer 6 (*)    GFR calc Af Amer 7 (*)    All other components within normal limits  CULTURE, BLOOD (ROUTINE X 2)  CULTURE, BLOOD (ROUTINE X 2)  CLOSTRIDIUM DIFFICILE BY PCR  I-STAT TROPOININ, ED  I-STAT CG4 LACTIC ACID, ED   Imaging Review Dg Chest Portable 1 View  05/26/2013   CLINICAL DATA:  Dizziness,  chills and fever for 2 days, history hypertension, diabetes, end-stage renal disease on dialysis  EXAM: PORTABLE CHEST - 1 VIEW  COMPARISON:  Portable exam 1401 hr compared to 02/21/2013  FINDINGS: Normal heart size, mediastinal contours, and pulmonary vascularity.  Lungs clear.  No pleural effusion or pneumothorax.  Bones unremarkable.  Vascular  stent at the left subclavian vessels again noted.  IMPRESSION: No acute abnormalities.   Electronically Signed   By: Ulyses SouthwardMark  Boles M.D.   On: 05/26/2013 14:11     EKG Interpretation None      Date: 05/26/2013  Rate: 107  Rhythm: sinus tachycardia  QRS Axis: normal  Intervals: normal  ST/T Wave abnormalities: normal  Conduction Disutrbances:none  Narrative Interpretation:   Old EKG Reviewed: unchanged    MDM   Final diagnoses:  Sepsis  Fever    68M hx of ESRD - had diarrhea through the weekend. Weight low at dialysis today, had full treatment. Got dizzy, had hypotension at dialysis. Initial BP here 83/60. Patient given fluids with improvement in BP. Patient's rectal temp elevated, sepsis protocol initiated, given broad spectrum antibiotics. Patient had recent peritoneal dialysis catheter removed and is now using fistula in L arm. Has no abdominal pain, guarding, or rebound. Site look well without infection.  Mild white count. Normal lytes. CXR normal. Will scan without contrast to look for possible abscess or other intra-abdominal issue. Patient admitted to medicine.     Dagmar HaitWilliam Mannie Ohlin, MD 05/26/13 1540

## 2013-05-26 NOTE — ED Notes (Signed)
[facility] called for patient transport 

## 2013-05-26 NOTE — ED Notes (Signed)
[facility] [place].

## 2013-05-26 NOTE — H&P (Addendum)
History and Physical  Randall [name] WGN:[id number] DOB: 08-08-1965 DOA: 05/26/2013  Referring physician: EDP PCP: [name]., MD  Outpatient Specialists:  1. Nephrology: Dr. Fayrene Fearing Deterding.  Chief Complaint: Diarrhea  HPI: Randall [name] is a 48 y.o. male with history of ESRD on TTS hemodialysis, removal of nonfunctioning peritoneal dialysis catheter last week, hyperlipidemia, metabolic bone disease, hypertension, diet-controlled DM 2, presented to the ED post dialysis on 3/17 with complaints of diarrhea, intermittent dizziness and weakness. Since Saturday, patient has noticed several episodes of watery, green, mildly foul-smelling, nonbloody diarrhea without associated nausea, vomiting or abdominal pain. Appetite has been reasonable. He had some subjective fevers yesterday. Denies recent antibiotic use or sickly contacts. He felt slightly dizzy intermittently today. He completed 4-1/2 hours of dialysis today and apparently had hypotension at dialysis. He states that he usually has some hypotension post dialysis. He was advised to come to the ED where initial blood pressure was 83/60 which improved after IV fluid bolus by EDP. He was febrile to. Chest x-ray negative. Noncontrasted CT abdomen shows no acute intra-abdominal or intrapelvic abnormalities but shows a small subcutaneous fluid collection. Hospitalist admission requested. Patient has been empirically started on IV vancomycin and Zosyn.   Review of Systems: All systems reviewed and apart from history of presenting illness, are negative.  Past Medical History  Diagnosis Date  . Hyperlipidemia   . Thyroid disorder     takes sensipar  . 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 Hyperthyroidism   . Hypertension     hx of off bp meds since 2012  . Sleep apnea     hx of sleep apnea prior to weight loss in 2012  . Diabetes mellitus     controlling by weight/diet  . ESRD on hemodialysis march 2012    [facility] on MWF,  ESRD due to DM/HTN  . Peritoneal dialysis status     oct 2014 to dec 2014   Past Surgical History  Procedure Laterality Date  . Gastric restriction surgery  12/06/09    lap band  . Dialysis fistula creation  2011  . Insertion of dialysis catheter  11/13/2011    Procedure: INSERTION OF DIALYSIS CATHETER;  Surgeon: Sherren Kerns, MD;  Location: [facility] OR;  Service: Vascular;  Laterality: Right;  Insertion of 23cm dialysis catheter in right IJ  . Av fistula placement  12/05/2011    Procedure: ARTERIOVENOUS (AV) FISTULA CREATION;  Surgeon: Larina Earthly, MD;  Location: [facility] OR;  Service: Vascular;  Laterality: Left;  . Av fistula placement  01-23-2012    #1 ligation of 2 competing branches of left upper arm AVF   #2  superifical mobilization of left upper arm AVF   . Capd removal N/A 05/18/2013    Procedure: LAPAROSCOPIC REMOVAL CONTINUOUS AMBULATORY PERITONEAL DIALYSIS  (CAPD) CATHETER;  Surgeon: Valarie Merino, MD;  Location: [facility] ORS;  Service: General;  Laterality: N/A;   Social History:  reports that he has never smoked. He has never used smokeless tobacco. He reports that he does not drink alcohol or use illicit drugs. Divorced. Independent of activities of daily living. His teenage son lives with him.  Allergies  Allergen Reactions  . Iohexol Itching    Pt had severe itching to head,face,upper chest after 80 ml Omni 300 administered.No obvious hives. I spoke to Dr Ruffin Frederick, and he didn't feel the need for Benadryl to be given.  KR    Family History  Problem Relation  Age of Onset  . Hypertension Mother   . Diabetes Mother   . Hypertension Father   . Diabetes Father     Prior to Admission medications   Medication Sig Start Date End Date Taking? Authorizing Provider  acetaminophen (TYLENOL) 325 MG tablet Take 650 mg by mouth every 6 (six) hours as needed for moderate pain.   Yes Historical Provider, MD  ibuprofen (ADVIL,MOTRIN) 200 MG tablet Take 400 mg by mouth every 6 (six) hours as needed  for moderate pain.   Yes Historical Provider, MD  lanthanum (FOSRENOL) 1000 MG chewable tablet Chew 2,000-3,000 mg by mouth See admin instructions. Takes 3 tablets with meals and 2 tablets with snacks   Yes Historical Provider, MD  loperamide (IMODIUM) 2 MG capsule Take 2 mg by mouth as needed for diarrhea or loose stools.   Yes Historical Provider, MD  loratadine (CLARITIN) 10 MG tablet Take 10 mg by mouth daily as needed for allergies.   Yes Historical Provider, MD  pregabalin (LYRICA) 50 MG capsule Take 50 mg by mouth at bedtime.   Yes Historical Provider, MD  SENSIPAR 60 MG tablet Take 60 mg by mouth at bedtime.  11/27/12  Yes Historical Provider, MD  white petrolatum (VASELINE) GEL Apply 1 application topically as needed for dry skin (on feet).   Yes Historical Provider, MD   Physical Exam: Filed Vitals:   05/26/13 1339 05/26/13 1400 05/26/13 1452 05/26/13 1530  BP:  112/73 138/67 128/69  Pulse:  113  110  Temp:      TempSrc:      Resp:      Height: 5' 10.87" (1.8 m)     Weight: 123.8 kg (272 lb 14.9 oz)     SpO2:  97%  93%     General exam: Moderately built and nourished pleasant male patient, lying comfortably supine on the gurney in no obvious distress. Does not look septic or toxic.  Head, eyes and ENT: Nontraumatic and normocephalic. Pupils equally reacting to light and accommodation. Oral mucosa moist.  Neck: Supple. No JVD, carotid bruit or thyromegaly.  Lymphatics: No lymphadenopathy.  Respiratory system: Clear to auscultation. No increased work of breathing.  Cardiovascular system: S1 and S2 heard, RRR. No JVD, murmurs, gallops, clicks or pedal edema.  Gastrointestinal system: Abdomen is nondistended, soft and nontender. Normal bowel sounds heard. No organomegaly or masses appreciated. Surgical site sutures are intact without acute findings. No drainage.  Central nervous system: Alert and oriented. No focal neurological deficits.  Extremities: Symmetric 5 x 5 power.  Peripheral pulses symmetrically felt. Left upper extremity AV fistula without acute findings.  Skin: No rashes or acute findings.  Musculoskeletal system: Negative exam.  Psychiatry: Pleasant and cooperative.   Labs on Admission:  Basic Metabolic Panel:  Recent Labs Lab 05/26/13 1410  NA 135*  K 3.7  CL 89*  CO2 27  GLUCOSE 117*  BUN 20  CREATININE 8.67*  CALCIUM 8.8   Liver Function Tests: No results found for this basename: AST, ALT, ALKPHOS, BILITOT, PROT, ALBUMIN,  in the last 168 hours No results found for this basename: LIPASE, AMYLASE,  in the last 168 hours No results found for this basename: AMMONIA,  in the last 168 hours CBC:  Recent Labs Lab 05/26/13 1410  WBC 10.7*  HGB 13.0  HCT 39.5  MCV 95.2  PLT 150   Cardiac Enzymes: No results found for this basename: CKTOTAL, CKMB, CKMBINDEX, TROPONINI,  in the last 168 hours  BNP (last  3 results) No results found for this basename: PROBNP,  in the last 8760 hours CBG: No results found for this basename: GLUCAP,  in the last 168 hours  Radiological Exams on Admission: Ct Abdomen Pelvis Wo Contrast  05/26/2013   CLINICAL DATA:  Diarrhea, hypotension, end-stage renal disease on dialysis, fever, recent peritoneal dialysis catheter removal, past history diabetes, hypertension  EXAM: CT ABDOMEN AND PELVIS WITHOUT CONTRAST  TECHNIQUE: Multidetector CT imaging of the abdomen and pelvis was performed following the standard protocol without intravenous contrast. Neither oral nor intravenous contrast utilized to history of contrast allergy.  COMPARISON:  04/03/2013  FINDINGS: Lung bases clear.  Prior laparoscopic gastric banding with normal angle of band on scout image and coronal view.  Post cholecystectomy.  Within limits of a nonenhanced exam, no focal abnormalities of the liver, spleen, and pancreas, kidneys, or adrenal glands.  Scattered atherosclerotic calcification aorta and coronary arteries.  Small umbilical hernia  containing fat.  Small subcutaneous fluid collection in the anterior right abdomen, 4.3 x 3.3 x 4.9 cm, could represent edema, hemorrhage or infection, question related to site of prior peritoneal dialysis catheter removal.  High attenuation material in distal colon of uncertain etiology question radiodense medication.  Bowel loops grossly unremarkable for technique.  No mass, adenopathy, free intraperitoneal fluid, or free air.  No acute osseous findings.  IMPRESSION: Small umbilical hernia.  Small subcutaneous fluid collection in right anterior abdominal wall 4.3 x 3.3 x 4.9 cm in size, question related to removal of prior peritoneal dialysis catheter, could represent edema, hemorrhage or infection.  No acute intra-abdominal or intrapelvic abnormalities.   Electronically Signed   By: Ulyses SouthwardMark  Boles M.D.   On: 05/26/2013 16:11   Dg Chest Portable 1 View  05/26/2013   CLINICAL DATA:  Dizziness, chills and fever for 2 days, history hypertension, diabetes, end-stage renal disease on dialysis  EXAM: PORTABLE CHEST - 1 VIEW  COMPARISON:  Portable exam 1401 hr compared to 02/21/2013  FINDINGS: Normal heart size, mediastinal contours, and pulmonary vascularity.  Lungs clear.  No pleural effusion or pneumothorax.  Bones unremarkable.  Vascular stent at the left subclavian vessels again noted.  IMPRESSION: No acute abnormalities.   Electronically Signed   By: Ulyses SouthwardMark  Boles M.D.   On: 05/26/2013 14:11    EKG: Independently reviewed. Poor tracing. Sinus tachycardia without apparent acute findings.  Assessment/Plan Principal Problem:   Diarrhea Active Problems:   ESRD (end stage renal disease) on dialysis   Hypertension   Sepsis   Hypotension   1. Diarrhea: Rule out infectious etiology-acute viral GE versus rule out C. difficile. Contact isolation. Sent stool for C. difficile PCR. We'll start oral Flagyl pending C. difficile results which can be discontinued if negative. 2. Hypotension: Likely secondary to  profuse diarrhea and dialysis today. Improved after IV fluid hydration. Monitor closely. Avoid excess IV fluids given history of ESRD 3. Sepsis: Secondary to problem #1. Chest x-ray negative for acute findings. Surgery has evaluated abdomen and do not believe it is the source of infection. Continue IV vancomycin and Zosyn per pharmacy. Follow blood culture results. 4. ESRD on TTS dialysis: Completed hemodialysis today. [facility] and alert nephrologist of admission. 5. Diet-controlled DM 2: Check CBGs.    Code Status: Full  Family Communication: None at bedside  [facility]   Time spent: 60 minutes  A[facility] CenterNGALGI,Dejon Jungman, MD, FACP, FHM. [facility] Pager [phone]  If 7PM-7AM, please contact night-coverage [email or web address] Password  [email or web address] 05/26/2013, 4:56 PM

## 2013-05-27 DIAGNOSIS — A0472 Enterocolitis due to Clostridium difficile, not specified as recurrent: Secondary | ICD-10-CM

## 2013-05-27 DIAGNOSIS — R509 Fever, unspecified: Secondary | ICD-10-CM

## 2013-05-27 DIAGNOSIS — E872 Acidosis, unspecified: Secondary | ICD-10-CM

## 2013-05-27 DIAGNOSIS — R109 Unspecified abdominal pain: Secondary | ICD-10-CM

## 2013-05-27 LAB — MRSA PCR SCREENING: MRSA by PCR: NEGATIVE

## 2013-05-27 LAB — CBC
HEMATOCRIT: 33 % — AB (ref 39.0–52.0)
Hemoglobin: 10.6 g/dL — ABNORMAL LOW (ref 13.0–17.0)
MCH: 31.1 pg (ref 26.0–34.0)
MCHC: 32.1 g/dL (ref 30.0–36.0)
MCV: 96.8 fL (ref 78.0–100.0)
PLATELETS: 135 10*3/uL — AB (ref 150–400)
RBC: 3.41 MIL/uL — ABNORMAL LOW (ref 4.22–5.81)
RDW: 14.8 % (ref 11.5–15.5)
WBC: 9.3 10*3/uL (ref 4.0–10.5)

## 2013-05-27 LAB — CLOSTRIDIUM DIFFICILE BY PCR: Toxigenic C. Difficile by PCR: POSITIVE — AB

## 2013-05-27 LAB — BASIC METABOLIC PANEL
BUN: 28 mg/dL — AB (ref 6–23)
CALCIUM: 7.9 mg/dL — AB (ref 8.4–10.5)
CHLORIDE: 95 meq/L — AB (ref 96–112)
CO2: 22 meq/L (ref 19–32)
Creatinine, Ser: 11.09 mg/dL — ABNORMAL HIGH (ref 0.50–1.35)
GFR calc Af Amer: 6 mL/min — ABNORMAL LOW (ref >90)
GFR calc non Af Amer: 5 mL/min — ABNORMAL LOW (ref >90)
Glucose, Bld: 103 mg/dL — ABNORMAL HIGH (ref 70–99)
Potassium: 3.7 mEq/L (ref 3.7–5.3)
Sodium: 137 mEq/L (ref 137–147)

## 2013-05-27 LAB — GLUCOSE, CAPILLARY: Glucose-Capillary: 98 mg/dL (ref 70–99)

## 2013-05-27 MED ORDER — VANCOMYCIN 50 MG/ML ORAL SOLUTION
500.0000 mg | Freq: Four times a day (QID) | ORAL | Status: DC
  Administered 2013-05-27 – 2013-05-31 (×13): 500 mg via ORAL
  Filled 2013-05-27 (×20): qty 10

## 2013-05-27 MED ORDER — OXYCODONE HCL 5 MG PO TABS
5.0000 mg | ORAL_TABLET | Freq: Once | ORAL | Status: AC
  Administered 2013-05-27: 5 mg via ORAL
  Filled 2013-05-27: qty 1

## 2013-05-27 MED ORDER — METRONIDAZOLE IN NACL 5-0.79 MG/ML-% IV SOLN
500.0000 mg | Freq: Three times a day (TID) | INTRAVENOUS | Status: DC
  Filled 2013-05-27 (×2): qty 100

## 2013-05-27 MED ORDER — RENA-VITE PO TABS
1.0000 | ORAL_TABLET | Freq: Every day | ORAL | Status: DC
  Administered 2013-05-27 – 2013-05-30 (×4): 1 via ORAL
  Filled 2013-05-27 (×5): qty 1

## 2013-05-27 MED ORDER — HYDROCODONE-ACETAMINOPHEN 5-325 MG PO TABS
1.0000 | ORAL_TABLET | Freq: Four times a day (QID) | ORAL | Status: DC | PRN
  Administered 2013-05-27 – 2013-05-31 (×8): 2 via ORAL
  Filled 2013-05-27 (×7): qty 2

## 2013-05-27 MED ORDER — INSULIN ASPART 100 UNIT/ML ~~LOC~~ SOLN
0.0000 [IU] | Freq: Three times a day (TID) | SUBCUTANEOUS | Status: DC
  Administered 2013-05-28: 1 [IU] via SUBCUTANEOUS

## 2013-05-27 MED ORDER — SODIUM CHLORIDE 0.9 % IV BOLUS (SEPSIS)
250.0000 mL | Freq: Once | INTRAVENOUS | Status: AC
  Administered 2013-05-27: 250 mL via INTRAVENOUS

## 2013-05-27 MED ORDER — VANCOMYCIN 50 MG/ML ORAL SOLUTION
500.0000 mg | Freq: Four times a day (QID) | ORAL | Status: DC
  Filled 2013-05-27 (×3): qty 10

## 2013-05-27 MED ORDER — VANCOMYCIN HCL IN DEXTROSE 1-5 GM/200ML-% IV SOLN: 1000.0000 mg | INTRAVENOUS | Status: DC

## 2013-05-27 MED ORDER — METRONIDAZOLE IN NACL 5-0.79 MG/ML-% IV SOLN
500.0000 mg | Freq: Three times a day (TID) | INTRAVENOUS | Status: DC
  Administered 2013-05-27 – 2013-05-31 (×10): 500 mg via INTRAVENOUS
  Filled 2013-05-27 (×15): qty 100

## 2013-05-27 NOTE — Consult Note (Signed)
I saw the patient and agree with the above assessment and plan.    66M presenting with 2-3d of profuse diarrhea, hypotension, abd pain.  C Diff positive and on Flagyl.  Full HD yesterday.  Volume status ok.  Lytes ok.  Cont on outpt Regimen.

## 2013-05-27 NOTE — Progress Notes (Signed)
ANTIBIOTIC CONSULT NOTE   Pharmacy Consult for vancomycin Indication: rule out sepsis  Allergies  Allergen Reactions  . Iohexol Itching    Pt had severe itching to head,face,upper chest after 80 ml Omni 300 administered.No obvious hives. I spoke to Dr Ruffin Frederick, and he didn't feel the need for Benadryl to be given.  KR    Patient Measurements: Height: 5\' 11"  (180.3 cm) Weight: 272 lb 4.3 oz (123.5 kg) IBW/kg (Calculated) : 75.3 Adjusted Body Weight: 93 kg  Vital Signs: Temp: 98 F (36.7 C) (03/18 0745) Temp src: Oral (03/18 0745) BP: 100/67 mmHg (03/18 0815) Pulse Rate: 102 (03/18 0815) Intake/Output from previous day:   Intake/Output from this shift:    Labs:  Recent Labs  05/26/13 1410 05/27/13 0524  WBC 10.7* 9.3  HGB 13.0 10.6*  PLT 150 135*  CREATININE 8.67* 11.09*   Estimated Creatinine Clearance: 10.9 ml/min (by C-G formula based on Cr of 11.09). No results found for this basename: VANCOTROUGH, VANCOPEAK, VANCORANDOM, GENTTROUGH, GENTPEAK, GENTRANDOM, TOBRATROUGH, TOBRAPEAK, TOBRARND, AMIKACINPEAK, AMIKACINTROU, AMIKACIN,  in the last 72 hours   Microbiology: Recent Results (from the past 720 hour(s))  CULTURE, BLOOD (ROUTINE X 2)     Status: None   Collection Time    05/26/13  2:10 PM      Result Value Ref Range Status   Specimen Description BLOOD RIGHT ANTECUBITAL   Final   Special Requests BOTTLES DRAWN AEROBIC AND ANAEROBIC   Final   Culture  Setup Time     Final   Value: 05/26/2013 16:05     Performed at [facility]   Culture     Final   Value:        BLOOD CULTURE RECEIVED NO GROWTH TO DATE CULTURE WILL BE HELD FOR 5 DAYS BEFORE ISSUING A FINAL NEGATIVE REPORT     Performed at [facility]   Report Status PENDING   Incomplete  CULTURE, BLOOD (ROUTINE X 2)     Status: None   Collection Time    05/26/13  3:30 PM      Result Value Ref Range Status   Specimen Description BLOOD RIGHT GROIN   Final   Special Requests BOTTLES DRAWN  AEROBIC AND ANAEROBIC   Final   Culture  Setup Time     Final   Value: 05/26/2013 18:53     Performed at [facility]   Culture     Final   Value:        BLOOD CULTURE RECEIVED NO GROWTH TO DATE CULTURE WILL BE HELD FOR 5 DAYS BEFORE ISSUING A FINAL NEGATIVE REPORT     Performed at [facility]   Report Status PENDING   Incomplete  CLOSTRIDIUM DIFFICILE BY PCR     Status: Abnormal   Collection Time    05/26/13 11:40 PM      Result Value Ref Range Status   C difficile by pcr POSITIVE (*) NEGATIVE Final   Comment: CRITICAL RESULT CALLED TO, READ BACK BY AND VERIFIED WITH:     ALaveda Norman RN 11:00 05/27/13 ([email or web address])  MRSA PCR SCREENING     Status: None   Collection Time    05/27/13  2:05 AM      Result Value Ref Range Status   MRSA by PCR NEGATIVE  NEGATIVE Final   Comment:            The GeneXpert MRSA Assay (FDA     approved for NASAL specimens  only), is one component of a     comprehensive MRSA colonization     surveillance program. It is not     intended to diagnose MRSA     infection nor to guide or     monitor treatment for     MRSA infections.    Medical History: Past Medical History  Diagnosis Date  . Hyperlipidemia   . Thyroid disorder     takes sensipar  . 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 Hyperthyroidism   . Hypertension     hx of off bp meds since 2012  . Sleep apnea     hx of sleep apnea prior to weight loss in 2012  . Diabetes mellitus     controlling by weight/diet  . ESRD on hemodialysis march 2012    [facility] MWF, ESRD due to DM/HTN  . Peritoneal dialysis status     oct 2014 to dec 2014    Medications:  Scheduled:  . cinacalcet  60 mg Oral Q supper  . heparin  5,000 Units Subcutaneous 3 times per day  . lanthanum  3,000 mg Oral TID WC  . metroNIDAZOLE  500 mg Oral 3 times per day  . multivitamin  1 tablet Oral QHS  . piperacillin-tazobactam (ZOSYN)  IV  2.25 g Intravenous Q8H  . pregabalin  50 mg Oral QHS   . sodium chloride  3 mL Intravenous Q12H   Infusions:    Assessment: 48 yo male currently on dialysis presented to ER today with hypotension while at dialysis center today. Patient had been complaining of diarrhea and dizziness last 3 days and recently had peritoneal catheter removed as well. To start vancomycin and Zosyn per pharmacy dosing for possible sepsis.  Plan HD tomorrow 3/19  Goal of Therapy:  Target Peak in HD = 25 mcg/mL, Target pre-HD level 15-25, Target post-HD level 5-15  Plan:  1) Vancomycin 1gm IV after each HD 2)  Check preHD levels when appropriate   Talbert CageLora Caffie Sotto, PharmD Pager [phone] 05/27/2013 11:15 AM

## 2013-05-27 NOTE — Progress Notes (Signed)
Utilization review completed.  

## 2013-05-27 NOTE — Consult Note (Signed)
[facility] Renal Consultation Note  Indication for Consultation:  Management of ESRD/hemodialysis; anemia, hypertension/volume and secondary hyperparathyroidism  HPI: Randall [name] is a 48 y.o. male admitted yesterday co since Saturday, eating cheeseburger his son cooked patient has noticed several episodes of watery, green, mildly foul-smelling, nonbloody diarrhea no  nausea, vomiting or abdominal pain.  But aching pain in abdominal are to back and shoulders .No other contacts sick  He completed 4-1/2 hours of dialysis was hypotensive with Dr. Jimmy Footman eval on hd with Blood cultures  and Vancomycin and Tressie Ellis given at hd and told to come to ER.  In  ED  initial blood pressure was 83/60 which improved after IV fluid bolus by EDP. He was febrile to. Chest x-ray negative. Noncontrasted CT abdomen shows no acute intra process. Now feeling better sitting up on bed side eating  breakfast.     Past Medical History  Diagnosis Date  . Hyperlipidemia   . Thyroid disorder     takes sensipar  . 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 Hyperthyroidism   . Hypertension     hx of off bp meds since 2012  . Sleep apnea     hx of sleep apnea prior to weight loss in 2012  . Diabetes mellitus     controlling by weight/diet  . ESRD on hemodialysis march 2012    [facility] on MWF, ESRD due to DM/HTN  . Peritoneal dialysis status     oct 2014 to dec 2014    Past Surgical History  Procedure Laterality Date  . Gastric restriction surgery  12/06/09    lap band  . Dialysis fistula creation  2011  . Insertion of dialysis catheter  11/13/2011    Procedure: INSERTION OF DIALYSIS CATHETER;  Surgeon: Elam Dutch, MD;  Location: [place];  Service: Vascular;  Laterality: Right;  Insertion of 23cm dialysis catheter in right IJ  . Av fistula placement  12/05/2011    Procedure: ARTERIOVENOUS (AV) FISTULA CREATION;  Surgeon: Rosetta Posner, MD;  Location: [place];  Service: Vascular;  Laterality:  Left;  . Av fistula placement  01-23-2012    #1 ligation of 2 competing branches of left upper arm AVF   #2  superifical mobilization of left upper arm AVF   . Capd removal N/A 05/18/2013    Procedure: LAPAROSCOPIC REMOVAL CONTINUOUS AMBULATORY PERITONEAL DIALYSIS  (CAPD) CATHETER;  Surgeon: Pedro Earls, MD;  Location: [facility];  Service: General;  Laterality: N/A;      Family History  Problem Relation Age of Onset  . Hypertension Mother   . Diabetes Mother   . Hypertension Father   . Diabetes Father       reports that he has never smoked. He has never used smokeless tobacco. He reports that he does not drink alcohol or use illicit drugs.   Allergies  Allergen Reactions  . Iohexol Itching    Pt had severe itching to head,face,upper chest after 80 ml Omni 300 administered.No obvious hives. I spoke to Dr Eustace Quail, and he didn't feel the need for Benadryl to be given.  KR    Prior to Admission medications   Medication Sig Start Date End Date Taking? Authorizing Provider  acetaminophen (TYLENOL) 325 MG tablet Take 650 mg by mouth every 6 (six) hours as needed for moderate pain.   Yes Historical Provider, MD  ibuprofen (ADVIL,MOTRIN) 200 MG tablet Take 400 mg by mouth every 6 (six) hours as needed  for moderate pain.   Yes Historical Provider, MD  lanthanum (FOSRENOL) 1000 MG chewable tablet Chew 2,000-3,000 mg by mouth See admin instructions. Takes 3 tablets with meals and 2 tablets with snacks   Yes Historical Provider, MD  loperamide (IMODIUM) 2 MG capsule Take 2 mg by mouth as needed for diarrhea or loose stools.   Yes Historical Provider, MD  loratadine (CLARITIN) 10 MG tablet Take 10 mg by mouth daily as needed for allergies.   Yes Historical Provider, MD  pregabalin (LYRICA) 50 MG capsule Take 50 mg by mouth at bedtime.   Yes Historical Provider, MD  SENSIPAR 60 MG tablet Take 60 mg by mouth at bedtime.  11/27/12  Yes Historical Provider, MD  white petrolatum (VASELINE) GEL Apply 1  application topically as needed for dry skin (on feet).   Yes Historical Provider, MD     Results for orders placed during the hospital encounter of 05/26/13 (from the past 48 hour(s))  CBC     Status: Abnormal   Collection Time    05/26/13  2:10 PM      Result Value Ref Range   WBC 10.7 (*) 4.0 - 10.5 K/uL   RBC 4.15 (*) 4.22 - 5.81 MIL/uL   Hemoglobin 13.0  13.0 - 17.0 g/dL   HCT 22.4  00.1 - 80.9 %   MCV 95.2  78.0 - 100.0 fL   MCH 31.3  26.0 - 34.0 pg   MCHC 32.9  30.0 - 36.0 g/dL   RDW 70.4  49.2 - 52.4 %   Platelets 150  150 - 400 K/uL  BASIC METABOLIC PANEL     Status: Abnormal   Collection Time    05/26/13  2:10 PM      Result Value Ref Range   Sodium 135 (*) 137 - 147 mEq/L   Potassium 3.7  3.7 - 5.3 mEq/L   Chloride 89 (*) 96 - 112 mEq/L   CO2 27  19 - 32 mEq/L   Glucose, Bld 117 (*) 70 - 99 mg/dL   BUN 20  6 - 23 mg/dL   Creatinine, Ser 1.59 (*) 0.50 - 1.35 mg/dL   Calcium 8.8  8.4 - 01.7 mg/dL   GFR calc non Af Amer 6 (*) >90 mL/min   GFR calc Af Amer 7 (*) >90 mL/min   Comment: (NOTE)     The eGFR has been calculated using the CKD EPI equation.     This calculation has not been validated in all clinical situations.     eGFR's persistently <90 mL/min signify possible Chronic Kidney     Disease.  Rosezena Sensor, ED     Status: None   Collection Time    05/26/13  2:26 PM      Result Value Ref Range   Troponin i, poc 0.01  0.00 - 0.08 ng/mL   Comment 3            Comment: Due to the release kinetics of cTnI,     a negative result within the first hours     of the onset of symptoms does not rule out     myocardial infarction with certainty.     If myocardial infarction is still suspected,     repeat the test at appropriate intervals.  I-STAT CG4 LACTIC ACID, ED     Status: None   Collection Time    05/26/13  2:29 PM      Result Value Ref Range   Lactic  Acid, Venous 1.91  0.5 - 2.2 mmol/L  MRSA PCR SCREENING     Status: None   Collection Time     05/27/13  2:05 AM      Result Value Ref Range   MRSA by PCR NEGATIVE  NEGATIVE   Comment:            The GeneXpert MRSA Assay (FDA     approved for NASAL specimens     only), is one component of a     comprehensive MRSA colonization     surveillance program. It is not     intended to diagnose MRSA     infection nor to guide or     monitor treatment for     MRSA infections.  BASIC METABOLIC PANEL     Status: Abnormal   Collection Time    05/27/13  5:24 AM      Result Value Ref Range   Sodium 137  137 - 147 mEq/L   Potassium 3.7  3.7 - 5.3 mEq/L   Chloride 95 (*) 96 - 112 mEq/L   CO2 22  19 - 32 mEq/L   Glucose, Bld 103 (*) 70 - 99 mg/dL   BUN 28 (*) 6 - 23 mg/dL   Creatinine, Ser 11.09 (*) 0.50 - 1.35 mg/dL   Calcium 7.9 (*) 8.4 - 10.5 mg/dL   GFR calc non Af Amer 5 (*) >90 mL/min   GFR calc Af Amer 6 (*) >90 mL/min   Comment: (NOTE)     The eGFR has been calculated using the CKD EPI equation.     This calculation has not been validated in all clinical situations.     eGFR's persistently <90 mL/min signify possible Chronic Kidney     Disease.  CBC     Status: Abnormal   Collection Time    05/27/13  5:24 AM      Result Value Ref Range   WBC 9.3  4.0 - 10.5 K/uL   RBC 3.41 (*) 4.22 - 5.81 MIL/uL   Hemoglobin 10.6 (*) 13.0 - 17.0 g/dL   HCT 33.0 (*) 39.0 - 52.0 %   MCV 96.8  78.0 - 100.0 fL   MCH 31.1  26.0 - 34.0 pg   MCHC 32.1  30.0 - 36.0 g/dL   RDW 14.8  11.5 - 15.5 %   Platelets 135 (*) 150 - 400 K/uL    ROS:  See pos in hpi  Physical Exam: Filed Vitals:   05/27/13 0815  BP: 100/67  Pulse: 102  Temp:   Resp:      General:Obese bm . nad  Calm , appropriate  HEENT: [place], eomi , nonicteric MMM supple  Neck: no jvd Heart: RRR, no rub or mur Lungs: CTA Bilat.  Abdomen: obese, bs normoactive, soft tender epigastric  Extremities: no pedal edema Skin: no overt rash , no dc from prior pd cath removal site  Neuro: alert/ Ox12m, no acute deficits noted Dialysis  Access:  Positive  bruit  LUA AVF  Dialysis Orders: Center: [facility]  on TTS . [id number] HD Bath 2k, 2 ca  Time 4hrs 67min Heparin 12,000. Access L UA AVF BFR 500 DFR 800    PO calcitriol 1.0 mcg on TTS HD  Epogen 0   Units IV/HD  Venofer  $Remove'100mg'tKuFMRp$  loading  q hd last 3/31 then $RemoveB'50mg'QHJputvT$  4/02 weekly hd  O  Assessment/Plan 1. Hypotension = related to vol . Loss vs Sepsis= iv Fluid currently ,but taking  pos can dc ivf when bag finished sec ESRD/ given iv vanco and Fortaz yesterday on hd / now on flagyl  C. Diff oending 2. ESRD -  Hd on schedule tts k 3.7 3. Hypertension/volume  - hypotensive as above  / keep even on hd in am 4. Anemia  - 10.6 hgb / no esa as op fu hgb pre hd / continue venofer load  5. Metabolic bone disease -  Po vit d/ sensipar/ binder / op phos was 9= needed compliance 6. Nutrition - renal card mod  , renavite  Ernest Haber, PA-C [place] [phone] 05/27/2013, 8:24 AM

## 2013-05-27 NOTE — Progress Notes (Signed)
[facility] PROGRESS NOTE  Randall [name] ZOX:[id number] DOB: [birth year] DOA: 05/26/2013 PCP: [name]., MD  Assessment/Plan: Principal Problem:   C. Diff Diarrhea -Stool + for C.diff -will place on oral vanc and flagyl as per protocol for severe c.diff, follw -will d/c empiric iv vanc and zosyn Active Problems:  Hypotension/Sepsis  -Secondary to above, BP improving -abx changed as above -pt evaluated by surgery and  and do not believe abdomen is the source of infection. -blood cultures so far neg  ESRD (end stage renal disease) on dialysis  -s/p PD cath removal -dialysis per renal Diet-controlled DM 2: Check CBGs, cover with SSI H/o Hypertension  - on no meds     Code Status: full Family Communication: none at bedside Disposition Plan: to home when medically ready   Consultants:  renal  Procedures: none Antibiotics:  vanc 3/17>>3/18  Zosyn 3/17>>3/18  Oral vanc 3/18>>  IV flagyl 3/18>>   HPI/Subjective: Still with multiple episodes of diarrhae, non bloody. Denies n/v  Objective: Filed Vitals:   05/27/13 1425  BP: 110/54  Pulse: 100  Temp: 98.7 F (37.1 C)  Resp: 18    Intake/Output Summary (Last 24 hours) at 05/27/13 1807 Last data filed at 05/27/13 0840  Gross per 24 hour  Intake    240 ml  Output      0 ml  Net    240 ml   Filed Weights   05/26/13 1339 05/26/13 2133  Weight: 123.8 kg (272 lb 14.9 oz) 123.5 kg (272 lb 4.3 oz)    Exam:  General: alert & oriented x  In NAD Cardiovascular: RRR, nl S1 s2 Respiratory: CTAB Abdomen: soft +BS NT/ND, no masses palpable Extremities: No cyanosis and no edema     Data Reviewed: Basic Metabolic Panel:  Recent Labs Lab 05/26/13 1410 05/27/13 0524  NA 135* 137  K 3.7 3.7  CL 89* 95*  CO2 27 22  GLUCOSE 117* 103*  BUN 20 28*  CREATININE 8.67* 11.09*  CALCIUM 8.8 7.9*   Liver Function Tests: No results found for this basename: AST, ALT, ALKPHOS, BILITOT, PROT,  ALBUMIN,  in the last 168 hours No results found for this basename: LIPASE, AMYLASE,  in the last 168 hours No results found for this basename: AMMONIA,  in the last 168 hours CBC:  Recent Labs Lab 05/26/13 1410 05/27/13 0524  WBC 10.7* 9.3  HGB 13.0 10.6*  HCT 39.5 33.0*  MCV 95.2 96.8  PLT 150 135*   Cardiac Enzymes: No results found for this basename: CKTOTAL, CKMB, CKMBINDEX, TROPONINI,  in the last 168 hours BNP (last 3 results) No results found for this basename: PROBNP,  in the last 8760 hours CBG: No results found for this basename: GLUCAP,  in the last 168 hours  Recent Results (from the past 240 hour(s))  CULTURE, BLOOD (ROUTINE X 2)     Status: None   Collection Time    05/26/13  2:10 PM      Result Value Ref Range Status   Specimen Description BLOOD RIGHT ANTECUBITAL   Final   Special Requests BOTTLES DRAWN AEROBIC AND ANAEROBIC   Final   Culture  Setup Time     Final   Value: 05/26/2013 16:05     Performed at [facility]   Culture     Final   Value:        BLOOD CULTURE RECEIVED NO GROWTH TO DATE CULTURE WILL BE HELD FOR 5 DAYS BEFORE ISSUING  A FINAL NEGATIVE REPORT     [facility]   Report Status PENDING   Incomplete  CULTURE, BLOOD (ROUTINE X 2)     Status: None   Collection Time    05/26/13  3:30 PM      Result Value Ref Range Status   Specimen Description BLOOD RIGHT GROIN   Final   Special Requests BOTTLES DRAWN AEROBIC AND ANAEROBIC 5ML   Final   Culture  Setup Time     Final   Value: 05/26/2013 18:53     [facility]formed at [facility]   Culture     Final   Value:        BLOOD CULTURE RECEIVED NO GROWTH TO DATE CULTURE WILL BE HELD FOR 5 DAYS BEFORE ISSUING A FINAL NEGATIVE REPORT     [facility] at [facility]   Report Status PENDING   Incomplete  CLOSTRIDIUM DIFFICILE BY PCR     Status: Abnormal   Collection Time    05/26/13 11:40 PM      Result Value Ref Range Status   C difficile by pcr POSITIVE  (*) NEGATIVE Final   Comment: CRITICAL RESULT CALLED TO, READ BACK BY AND VERIFIED WITH:     ALaveda Norman. TONEY RN 11:00 05/27/13 ([email or web address])  MRSA PCR SCREENING     Status: None   Collection Time    05/27/13  2:05 AM      Result Value Ref Range Status   MRSA by PCR NEGATIVE  NEGATIVE Final   Comment:            The GeneXpert MRSA Assay (FDA     approved for NASAL specimens     only), is one component of a     comprehensive MRSA colonization     surveillance program. It is not     intended to diagnose MRSA     infection nor to guide or     monitor treatment for     MRSA infections.     Studies: Ct Abdomen Pelvis Wo Contrast  05/26/2013   CLINICAL DATA:  Diarrhea, hypotension, end-stage renal disease on dialysis, fever, recent peritoneal dialysis catheter removal, past history diabetes, hypertension  EXAM: CT ABDOMEN AND PELVIS WITHOUT CONTRAST  TECHNIQUE: Multidetector CT imaging of the abdomen and pelvis was performed following the standard protocol without intravenous contrast. Neither oral nor intravenous contrast utilized to history of contrast allergy.  COMPARISON:  04/03/2013  FINDINGS: Lung bases clear.  Prior laparoscopic gastric banding with normal angle of band on scout image and coronal view.  Post cholecystectomy.  Within limits of a nonenhanced exam, no focal abnormalities of the liver, spleen, and pancreas, kidneys, or adrenal glands.  Scattered atherosclerotic calcification aorta and coronary arteries.  Small umbilical hernia containing fat.  Small subcutaneous fluid collection in the anterior right abdomen, 4.3 x 3.3 x 4.9 cm, could represent edema, hemorrhage or infection, question related to site of prior peritoneal dialysis catheter removal.  High attenuation material in distal colon of uncertain etiology question radiodense medication.  Bowel loops grossly unremarkable for technique.  No mass, adenopathy, free intraperitoneal fluid, or free air.  No acute osseous findings.  IMPRESSION:  Small umbilical hernia.  Small subcutaneous fluid collection in right anterior abdominal wall 4.3 x 3.3 x 4.9 cm in size, question related to removal of prior peritoneal dialysis catheter, could represent edema, hemorrhage or infection.  No acute intra-abdominal or intrapelvic abnormalities.   Electronically Signed   By: Loraine LericheMark  Tyron Russell M.D.   On: 05/26/2013 16:11   Dg Chest Portable 1 View  05/26/2013   CLINICAL DATA:  Dizziness, chills and fever for 2 days, history hypertension, diabetes, end-stage renal disease on dialysis  EXAM: PORTABLE CHEST - 1 VIEW  COMPARISON:  Portable exam 1401 hr compared to 02/21/2013  FINDINGS: Normal heart size, mediastinal contours, and pulmonary vascularity.  Lungs clear.  No pleural effusion or pneumothorax.  Bones unremarkable.  Vascular stent at the left subclavian vessels again noted.  IMPRESSION: No acute abnormalities.   Electronically Signed   By: Ulyses Southward M.D.   On: 05/26/2013 14:11    Scheduled Meds: . cinacalcet  60 mg Oral Q supper  . heparin  5,000 Units Subcutaneous 3 times per day  . lanthanum  3,000 mg Oral TID WC  . metronidazole  500 mg Intravenous Q8H   And  . vancomycin  500 mg Oral 4 times per day  . multivitamin  1 tablet Oral QHS  . pregabalin  50 mg Oral QHS  . sodium chloride  3 mL Intravenous Q12H   Continuous Infusions:   Principal Problem:   Diarrhea Active Problems:   ESRD (end stage renal disease) on dialysis   Hypertension   Sepsis   Hypotension    Time spent: 35    Lorimer Tiberio C  [facility] Pager [phone]. If 7PM-7AM, please contact night-coverage at [email or web address], password [facility] 05/27/2013, 6:07 PM  LOS: 1 day

## 2013-05-28 LAB — RENAL FUNCTION PANEL
Albumin: 2.6 g/dL — ABNORMAL LOW (ref 3.5–5.2)
BUN: 39 mg/dL — ABNORMAL HIGH (ref 6–23)
CALCIUM: 7.6 mg/dL — AB (ref 8.4–10.5)
CO2: 21 mEq/L (ref 19–32)
Chloride: 92 mEq/L — ABNORMAL LOW (ref 96–112)
Creatinine, Ser: 14.31 mg/dL — ABNORMAL HIGH (ref 0.50–1.35)
GFR, EST AFRICAN AMERICAN: 4 mL/min — AB (ref >90)
GFR, EST NON AFRICAN AMERICAN: 3 mL/min — AB (ref >90)
Glucose, Bld: 133 mg/dL — ABNORMAL HIGH (ref 70–99)
Phosphorus: 5.7 mg/dL — ABNORMAL HIGH (ref 2.3–4.6)
Potassium: 3.7 mEq/L (ref 3.7–5.3)
SODIUM: 134 meq/L — AB (ref 137–147)

## 2013-05-28 LAB — GLUCOSE, CAPILLARY
GLUCOSE-CAPILLARY: 119 mg/dL — AB (ref 70–99)
Glucose-Capillary: 110 mg/dL — ABNORMAL HIGH (ref 70–99)
Glucose-Capillary: 129 mg/dL — ABNORMAL HIGH (ref 70–99)
Glucose-Capillary: 151 mg/dL — ABNORMAL HIGH (ref 70–99)

## 2013-05-28 LAB — CBC
HEMATOCRIT: 26.3 % — AB (ref 39.0–52.0)
Hemoglobin: 8.6 g/dL — ABNORMAL LOW (ref 13.0–17.0)
MCH: 30.9 pg (ref 26.0–34.0)
MCHC: 32.7 g/dL (ref 30.0–36.0)
MCV: 94.6 fL (ref 78.0–100.0)
Platelets: 158 10*3/uL (ref 150–400)
RBC: 2.78 MIL/uL — AB (ref 4.22–5.81)
RDW: 15.2 % (ref 11.5–15.5)
WBC: 12.3 10*3/uL — ABNORMAL HIGH (ref 4.0–10.5)

## 2013-05-28 MED ORDER — HYDROCODONE-ACETAMINOPHEN 5-325 MG PO TABS
ORAL_TABLET | ORAL | Status: AC
  Filled 2013-05-28: qty 2

## 2013-05-28 MED ORDER — HEPARIN SODIUM (PORCINE) 1000 UNIT/ML IJ SOLN
2400.0000 [IU] | Freq: Once | INTRAMUSCULAR | Status: AC
  Administered 2013-05-28: 2400 [IU] via INTRAVENOUS

## 2013-05-28 NOTE — Progress Notes (Addendum)
[facility] PROGRESS NOTE  WAH SABIC ZOX:[id number] DOB: [birth year] DOA: 05/26/2013 PCP: [name]., MD  Assessment/Plan: Principal Problem:   [name]. Diff Diarrhea -Stool + for [name].diff -continue oral vanc and flagyl as per protocol for severe [name].diff, follow -continue supportive care Active Problems:  Hypotension/Sepsis  -Secondary to above, BP improving continue to follow -continue abx as above -pt evaluated by surgery and  and do not believe abdomen is the source of infection. -blood cultures so far neg  ESRD (end stage renal disease) on dialysis  -s/p PD cath removal -dialysis per renal Diet-controlled DM 2: Check CBGs, cover with SSI H/o Hypertension  - on no meds     Code Status: full Family Communication: none at bedside Disposition Plan: to home when medically ready   Consultants:  renal  Procedures: none Antibiotics:  vanc 3/17>>3/18  Zosyn 3/17>>3/18  Oral vanc 3/18>>  IV flagyl 3/18>>   HPI/Subjective: Still with diarrhea but decreasing- states only 2x so far this am, but that he had episode of stool incontinence overnight  Objective: Filed Vitals:   05/28/13 1716  BP: 105/51  Pulse: 89  Temp: 98.9 F (37.2 [name])  Resp: 19    Intake/Output Summary (Last 24 hours) at 05/28/13 1840 Last data filed at 05/28/13 1254  Gross per 24 hour  Intake      0 ml  Output      0 ml  Net      0 ml   Filed Weights   05/27/13 2155 05/28/13 0835 05/28/13 1254  Weight: 125.3 kg (276 lb 3.8 oz) 123 kg (271 lb 2.7 oz) 123 kg (271 lb 2.7 oz)    Exam:  General: alert & oriented x  In NAD Cardiovascular: RRR, nl S1 s2 Respiratory: CTAB Abdomen: soft +BS NT/ND, no masses palpable Extremities: No cyanosis and no edema     Data Reviewed: Basic Metabolic Panel:  Recent Labs Lab 05/26/13 1410 05/27/13 0524 05/28/13 0904  NA 135* 137 134*  K 3.7 3.7 3.7  CL 89* 95* 92*  CO2 27 22 21   GLUCOSE 117* 103* 133*  BUN 20 28* 39*  CREATININE  8.67* 11.09* 14.31*  CALCIUM 8.8 7.9* 7.6*  PHOS  --   --  5.7*   Liver Function Tests:  Recent Labs Lab 05/28/13 0904  ALBUMIN 2.6*   No results found for this basename: LIPASE, AMYLASE,  in the last 168 hours No results found for this basename: AMMONIA,  in the last 168 hours CBC:  Recent Labs Lab 05/26/13 1410 05/27/13 0524 05/28/13 0904  WBC 10.7* 9.3 12.3*  HGB 13.0 10.6* 8.6*  HCT 39.5 33.0* 26.3*  MCV 95.2 96.8 94.6  PLT 150 135* 158   Cardiac Enzymes: No results found for this basename: CKTOTAL, CKMB, CKMBINDEX, TROPONINI,  in the last 168 hours BNP (last 3 results) No results found for this basename: PROBNP,  in the last 8760 hours CBG:  Recent Labs Lab 05/27/13 2146 05/28/13 0800 05/28/13 1405 05/28/13 1715  GLUCAP 98 110* 119* 129*    Recent Results (from the past 240 hour(s))  CULTURE, BLOOD (ROUTINE X 2)     Status: None   Collection Time    05/26/13  2:10 PM      Result Value Ref Range Status   Specimen Description BLOOD RIGHT ANTECUBITAL   Final   Special Requests BOTTLES DRAWN AEROBIC AND ANAEROBIC   Final   Culture  Setup Time     Final  Value: 05/26/2013 16:05     Performed at [facility]   Culture     Final   Value:        BLOOD CULTURE RECEIVED NO GROWTH TO DATE CULTURE WILL BE HELD FOR 5 DAYS BEFORE ISSUING A FINAL NEGATIVE REPORT     Performed at [facility]   Report Status PENDING   Incomplete  CULTURE, BLOOD (ROUTINE X 2)     Status: None   Collection Time    05/26/13  3:30 PM      Result Value Ref Range Status   Specimen Description BLOOD RIGHT GROIN   Final   Special Requests BOTTLES DRAWN AEROBIC AND ANAEROBIC 5ML   Final   Culture  Setup Time     Final   Value: 05/26/2013 18:53     Performed at [facility]   Culture     Final   Value:        BLOOD CULTURE RECEIVED NO GROWTH TO DATE CULTURE WILL BE HELD FOR 5 DAYS BEFORE ISSUING A FINAL NEGATIVE REPORT     Performed at [facility]    Report Status PENDING   Incomplete  CLOSTRIDIUM DIFFICILE BY PCR     Status: Abnormal   Collection Time    05/26/13 11:40 PM      Result Value Ref Range Status   [name] difficile by pcr POSITIVE (*) NEGATIVE Final   Comment: CRITICAL RESULT CALLED TO, READ BACK BY AND VERIFIED WITH:     ALaveda Norman. TONEY RN 11:00 05/27/13 ([email or web address])  MRSA PCR SCREENING     Status: None   Collection Time    05/27/13  2:05 AM      Result Value Ref Range Status   MRSA by PCR NEGATIVE  NEGATIVE Final   Comment:            The GeneXpert MRSA Assay (FDA     approved for NASAL specimens     only), is one component of a     comprehensive MRSA colonization     surveillance program. It is not     intended to diagnose MRSA     infection nor to guide or     monitor treatment for     MRSA infections.     Studies: No results found.  Scheduled Meds: . cinacalcet  60 mg Oral Q supper  . heparin  5,000 Units Subcutaneous 3 times per day  . HYDROcodone-acetaminophen      . insulin aspart  0-9 Units Subcutaneous TID WC  . lanthanum  3,000 mg Oral TID WC  . metronidazole  500 mg Intravenous Q8H   And  . vancomycin  500 mg Oral 4 times per day  . multivitamin  1 tablet Oral QHS  . pregabalin  50 mg Oral QHS  . sodium chloride  3 mL Intravenous Q12H   Continuous Infusions:   Principal Problem:   Diarrhea Active Problems:   ESRD (end stage renal disease) on dialysis   Hypertension   Sepsis   Hypotension    Time spent: 35    Randall [name]  [facility] Pager [phone]. If 7PM-7AM, please contact night-coverage at [email or web address], password [email or web address] 05/28/2013, 6:40 PM  LOS: 2 days

## 2013-05-28 NOTE — Progress Notes (Signed)
Kaidra CMT notified pt going to HD.

## 2013-05-28 NOTE — Procedures (Signed)
I was present at this dialysis session. I have reviewed the session itself and made appropriate changes.   Still having freq stools. No UF today.  Qb 400 OLC green AVF.  Eating ok.  On PO Vanc/Flagyl.    Sabra [place] Wayson  MD 05/28/2013, 9:37 AM

## 2013-05-29 LAB — BASIC METABOLIC PANEL
BUN: 21 mg/dL (ref 6–23)
CALCIUM: 7.5 mg/dL — AB (ref 8.4–10.5)
CO2: 26 mEq/L (ref 19–32)
Chloride: 100 mEq/L (ref 96–112)
Creatinine, Ser: 9.43 mg/dL — ABNORMAL HIGH (ref 0.50–1.35)
GFR, EST AFRICAN AMERICAN: 7 mL/min — AB (ref >90)
GFR, EST NON AFRICAN AMERICAN: 6 mL/min — AB (ref >90)
GLUCOSE: 94 mg/dL (ref 70–99)
Potassium: 3.7 mEq/L (ref 3.7–5.3)
SODIUM: 143 meq/L (ref 137–147)

## 2013-05-29 LAB — GLUCOSE, CAPILLARY
Glucose-Capillary: 125 mg/dL — ABNORMAL HIGH (ref 70–99)
Glucose-Capillary: 157 mg/dL — ABNORMAL HIGH (ref 70–99)
Glucose-Capillary: 98 mg/dL (ref 70–99)
Glucose-Capillary: 75 mg/dL (ref 70–99)

## 2013-05-29 MED ORDER — CALCITRIOL 0.5 MCG PO CAPS
1.0000 ug | ORAL_CAPSULE | ORAL | Status: DC
  Administered 2013-05-30: 1 ug via ORAL
  Filled 2013-05-29 (×2): qty 2

## 2013-05-29 MED ORDER — DIPHENHYDRAMINE HCL 25 MG PO CAPS
25.0000 mg | ORAL_CAPSULE | Freq: Three times a day (TID) | ORAL | Status: DC | PRN
  Administered 2013-05-29 – 2013-05-30 (×3): 25 mg via ORAL
  Filled 2013-05-29 (×3): qty 1

## 2013-05-29 MED ORDER — SALINE SPRAY 0.65 % NA SOLN
1.0000 | NASAL | Status: DC | PRN
  Administered 2013-05-29 (×3): 1 via NASAL
  Filled 2013-05-29: qty 44

## 2013-05-29 MED ORDER — SODIUM CHLORIDE 0.9 % IV SOLN
62.5000 mg | INTRAVENOUS | Status: DC
  Administered 2013-05-30: 62.5 mg via INTRAVENOUS
  Filled 2013-05-29 (×2): qty 5

## 2013-05-29 MED ORDER — LORATADINE 10 MG PO TABS
10.0000 mg | ORAL_TABLET | Freq: Every day | ORAL | Status: DC | PRN
  Administered 2013-05-29: 10 mg via ORAL
  Filled 2013-05-29: qty 1

## 2013-05-29 MED ORDER — DIPHENHYDRAMINE HCL 25 MG PO CAPS: 25.0000 mg | ORAL_CAPSULE | Freq: Four times a day (QID) | ORAL | Status: DC | PRN

## 2013-05-29 NOTE — Progress Notes (Signed)
[facility] PROGRESS NOTE  Randall [name] ZOX:[id number] DOB: 04-22-1965 DOA: 05/26/2013 PCP: Randall [name]., MD  Assessment/Plan: Principal Problem:   C. Diff Diarrhea -Stool + for C.diff -continue oral vanc and flagyl as per protocol for severe c.diff, follow -continue supportive care -Beginning to improve, follow Active Problems:  Hypotension/Sepsis  -Secondary to above, BP improving continue to follow -continue abx as above -pt evaluated by surgery and  and do not believe abdomen is the source of infection. -blood cultures so far neg  ESRD (end stage renal disease) on dialysis  -s/p PD cath removal -dialysis per renal Diet-controlled DM 2: Check CBGs, cover with SSI H/o Hypertension  - on no meds     Code Status: full Family Communication: none at bedside Disposition Plan: to home when medically ready   Consultants:  renal  Procedures: none Antibiotics:  vanc 3/17>>3/18  Zosyn 3/17>>3/18  Oral vanc 3/18>>  IV flagyl 3/18>>   HPI/Subjective: Diarrhea decreasing, decreasing abdominal pain.denies nausea vomiting  Objective: Filed Vitals:   05/29/13 0836  BP: 124/76  Pulse: 86  Temp: 98 F (36.7 C)  Resp: 20    Intake/Output Summary (Last 24 hours) at 05/29/13 1443 Last data filed at 05/29/13 0900  Gross per 24 hour  Intake      0 ml  Output      0 ml  Net      0 ml   Filed Weights   05/28/13 0835 05/28/13 1254 05/28/13 2050  Weight: 123 kg (271 lb 2.7 oz) 123 kg (271 lb 2.7 oz) 123.5 kg (272 lb 4.3 oz)    Exam:  General: alert & oriented x  In NAD Cardiovascular: RRR, nl S1 s2 Respiratory: CTAB Abdomen: soft +BS NT/ND, no masses palpable Extremities: No cyanosis and no edema     Data Reviewed: Basic Metabolic Panel:  Recent Labs Lab 05/26/13 1410 05/27/13 0524 05/28/13 0904 05/29/13 0500  NA 135* 137 134* 143  K 3.7 3.7 3.7 3.7  CL 89* 95* 92* 100  CO2 27 22 21 26   GLUCOSE 117* 103* 133* 94  BUN 20 28* 39* 21   CREATININE 8.67* 11.09* 14.31* 9.43*  CALCIUM 8.8 7.9* 7.6* 7.5*  PHOS  --   --  5.7*  --    Liver Function Tests:  Recent Labs Lab 05/28/13 0904  ALBUMIN 2.6*   No results found for this basename: LIPASE, AMYLASE,  in the last 168 hours No results found for this basename: AMMONIA,  in the last 168 hours CBC:  Recent Labs Lab 05/26/13 1410 05/27/13 0524 05/28/13 0904  WBC 10.7* 9.3 12.3*  HGB 13.0 10.6* 8.6*  HCT 39.5 33.0* 26.3*  MCV 95.2 96.8 94.6  PLT 150 135* 158   Cardiac Enzymes: No results found for this basename: CKTOTAL, CKMB, CKMBINDEX, TROPONINI,  in the last 168 hours BNP (last 3 results) No results found for this basename: PROBNP,  in the last 8760 hours CBG:  Recent Labs Lab 05/28/13 1405 05/28/13 1715 05/28/13 2040 05/29/13 0819 05/29/13 1339  GLUCAP 119* 129* 151* 125* 157*    Recent Results (from the past 240 hour(s))  CULTURE, BLOOD (ROUTINE X 2)     Status: None   Collection Time    05/26/13  2:10 PM      Result Value Ref Range Status   Specimen Description BLOOD RIGHT ANTECUBITAL   Final   Special Requests BOTTLES DRAWN AEROBIC AND ANAEROBIC   Final   Culture  Setup Time  Final   Value: 05/26/2013 16:05     Performed at [facility]   Culture     Final   Value:        BLOOD CULTURE RECEIVED NO GROWTH TO DATE CULTURE WILL BE HELD FOR 5 DAYS BEFORE ISSUING A FINAL NEGATIVE REPORT     Performed at [facility]   Report Status PENDING   Incomplete  CULTURE, BLOOD (ROUTINE X 2)     Status: None   Collection Time    05/26/13  3:30 PM      Result Value Ref Range Status   Specimen Description BLOOD RIGHT GROIN   Final   Special Requests BOTTLES DRAWN AEROBIC AND ANAEROBIC 5ML   Final   Culture  Setup Time     Final   Value: 05/26/2013 18:53     Performed at [facility]   Culture     Final   Value:        BLOOD CULTURE RECEIVED NO GROWTH TO DATE CULTURE WILL BE HELD FOR 5 DAYS BEFORE ISSUING A FINAL  NEGATIVE REPORT     Performed at [facility]   Report Status PENDING   Incomplete  CLOSTRIDIUM DIFFICILE BY PCR     Status: Abnormal   Collection Time    05/26/13 11:40 PM      Result Value Ref Range Status   C difficile by pcr POSITIVE (*) NEGATIVE Final   Comment: CRITICAL RESULT CALLED TO, READ BACK BY AND VERIFIED WITH:     ALaveda Norman. TONEY RN 11:00 05/27/13 ([email or web address])  MRSA PCR SCREENING     Status: None   Collection Time    05/27/13  2:05 AM      Result Value Ref Range Status   MRSA by PCR NEGATIVE  NEGATIVE Final   Comment:            The GeneXpert MRSA Assay (FDA     approved for NASAL specimens     only), is one component of a     comprehensive MRSA colonization     surveillance program. It is not     intended to diagnose MRSA     infection nor to guide or     monitor treatment for     MRSA infections.     Studies: No results found.  Scheduled Meds: . [START ON 05/30/2013] calcitRIOL  1 mcg Oral Q T,Th,Sa-HD  . cinacalcet  60 mg Oral Q supper  . [START ON 05/30/2013] ferric gluconate (FERRLECIT/NULECIT) IV  62.5 mg Intravenous Q T,Th,Sa-HD  . heparin  5,000 Units Subcutaneous 3 times per day  . insulin aspart  0-9 Units Subcutaneous TID WC  . lanthanum  3,000 mg Oral TID WC  . metronidazole  500 mg Intravenous Q8H   And  . vancomycin  500 mg Oral 4 times per day  . multivitamin  1 tablet Oral QHS  . pregabalin  50 mg Oral QHS  . sodium chloride  3 mL Intravenous Q12H   Continuous Infusions:   Principal Problem:   Diarrhea Active Problems:   ESRD (end stage renal disease) on dialysis   Hypertension   Sepsis   Hypotension    Time spent: 25    [facility] Digestive InstituteVIYUOH,[name]  [facility] Pager [phone]. If 7PM-7AM, please contact night-coverage at [email or web address], password [email or web address] 05/29/2013, 2:43 PM  LOS: 3 days

## 2013-05-29 NOTE — Progress Notes (Signed)
Subjective:  Feels better , less diarrhea, continues aching shoulder Objective Vital signs in last 24 hours: Filed Vitals:   05/28/13 1254 05/28/13 1406 05/28/13 1716 05/28/13 2050  BP: 103/64 100/60 105/51 113/50  Pulse: 96 100 89 100  Temp: 99.2 F (37.3 C) 99.7 F (37.6 C) 98.9 F (37.2 C) 98.6 F (37 C)  TempSrc: Oral  Oral Oral  Resp: 18 18 19 20   Height:    5\' 11"  (1.803 m)  Weight: 123 kg (271 lb 2.7 oz)   123.5 kg (272 lb 4.3 oz)  SpO2: 100% 90% 96% 99%   General:Obese bm . nad Calm , appropriate  Heart: RRR, no rub or mur  Lungs: CTA Bilat.  Abdomen: obese, bs normoactive, soft less tender epigastric  Extremities: no pedal edema  Dialysis Access: Positive bruit LUA AVF   Dialysis Orders: Center: [facility] on TTS .  [id number] HD Bath 2k, 2 ca Time 4hrs 15min Heparin 12,000. Access L UA AVF BFR 500 DFR 800  PO calcitriol 1.0 mcg on TTS HD Epogen 0 Units IV/HD Venofer 100mg  loading q hd last 3/31 then 50mg  4/02 weekly hd     Last pth 1000  Assessment/Plan         1. C Diff pos. = on po Vanco/ in flagyl        2. Hypotension = Resolved with iv fluid on admit / c. diffrx 3   ESRD - Hd on schedule tts k 3.7 4   Hypertension/volume - hypotensive as above / above edw slightly  123kg/  yesterday eating somewhat better 1.5 l uf in am on hd 5.  Anemia - 10.6>8.6 hgb / no esa  Restart Aranesp 60 next hd then weekly thurs.continue venofer load  6.  Metabolic bone disease - Ca corrected 8.6/ phos 5.7 on  Po vit d/ sensipar/ binder / op phos was 9= needed compliance and pth 1000 admits      to noncompliance with sensipar / MAY some element of aching shoulders and arms , hands with his C Diff/ neuropathy on Lyrica 7.  Nutrition - renal card mod , renavite  Labs: Basic Metabolic Panel:  Recent Labs Lab 05/27/13 0524 05/28/13 0904 05/29/13 0500  NA 137 134* 143  K 3.7 3.7 3.7  CL 95* 92* 100  CO2 22 21 26   GLUCOSE 103* 133* 94  BUN 28* 39* 21  CREATININE 11.09* 14.31* 9.43*   CALCIUM 7.9* 7.6* 7.5*  PHOS  --  5.7*  --    Liver Function Tests:  Recent Labs Lab 05/28/13 0904  ALBUMIN 2.6*  CBC:  Recent Labs Lab 05/26/13 1410 05/27/13 0524 05/28/13 0904  WBC 10.7* 9.3 12.3*  HGB 13.0 10.6* 8.6*  HCT 39.5 33.0* 26.3*  MCV 95.2 96.8 94.6  PLT 150 135* 158  CBG:  Recent Labs Lab 05/27/13 2146 05/28/13 0800 05/28/13 1405 05/28/13 1715 05/28/13 2040  GLUCAP 98 110* 119* 129* 151*    Studies/Results: No results found. Medications:   . cinacalcet  60 mg Oral Q supper  . heparin  5,000 Units Subcutaneous 3 times per day  . insulin aspart  0-9 Units Subcutaneous TID WC  . lanthanum  3,000 mg Oral TID WC  . metronidazole  500 mg Intravenous Q8H   And  . vancomycin  500 mg Oral 4 times per day  . multivitamin  1 tablet Oral QHS  . pregabalin  50 mg Oral QHS  . sodium chloride  3 mL Intravenous Q12H  Lenny Pastel, PA-C [facility] [phone] 05/29/2013,8:24 AM  LOS: 3 days

## 2013-05-29 NOTE — Progress Notes (Signed)
I saw the patient and agree with the above assessment and plan.    Pt doing well.  Tolerating some PO. BMs lessing. Abd still tender throughout.  Perhaps home tomorrow post HD if eating well and further improved.

## 2013-05-30 DIAGNOSIS — I10 Essential (primary) hypertension: Secondary | ICD-10-CM

## 2013-05-30 LAB — CBC
HCT: 58.3 % — ABNORMAL HIGH (ref 39.0–52.0)
Hemoglobin: 19.6 g/dL — ABNORMAL HIGH (ref 13.0–17.0)
MCH: 31.5 pg (ref 26.0–34.0)
MCHC: 33.6 g/dL (ref 30.0–36.0)
MCV: 93.7 fL (ref 78.0–100.0)
Platelets: 68 10*3/uL — ABNORMAL LOW (ref 150–400)
RBC: 6.22 MIL/uL — ABNORMAL HIGH (ref 4.22–5.81)
RDW: 14.8 % (ref 11.5–15.5)
WBC: 3.9 10*3/uL — ABNORMAL LOW (ref 4.0–10.5)

## 2013-05-30 LAB — BASIC METABOLIC PANEL
BUN: 34 mg/dL — AB (ref 6–23)
CO2: 24 mEq/L (ref 19–32)
CREATININE: 12.45 mg/dL — AB (ref 0.50–1.35)
Calcium: 7.2 mg/dL — ABNORMAL LOW (ref 8.4–10.5)
Chloride: 100 mEq/L (ref 96–112)
GFR calc Af Amer: 5 mL/min — ABNORMAL LOW (ref >90)
GFR calc non Af Amer: 4 mL/min — ABNORMAL LOW (ref >90)
GLUCOSE: 100 mg/dL — AB (ref 70–99)
Potassium: 3.6 mEq/L — ABNORMAL LOW (ref 3.7–5.3)
Sodium: 140 mEq/L (ref 137–147)

## 2013-05-30 LAB — GLUCOSE, CAPILLARY
Glucose-Capillary: 104 mg/dL — ABNORMAL HIGH (ref 70–99)
Glucose-Capillary: 130 mg/dL — ABNORMAL HIGH (ref 70–99)

## 2013-05-30 MED ORDER — HEPARIN SODIUM (PORCINE) 1000 UNIT/ML IJ SOLN
2500.0000 [IU] | Freq: Once | INTRAMUSCULAR | Status: AC
  Administered 2013-05-30: 2500 [IU] via INTRAVENOUS

## 2013-05-30 NOTE — Progress Notes (Signed)
Assessment/Plan  1. C Diff pos. = on po Vanco/ in flagyl, improved 2. Hypotension = Resolved with iv fluid  3 ESRD TTS, pending HD today   Subjective: Interval History: none.  Objective: Vital signs in last 24 hours: Temp:  [98 F (36.7 C)-99.2 F (37.3 C)] 99.2 F (37.3 C) (03/21 1150) Pulse Rate:  [86-102] 90 (03/21 1150) Resp:  [18-20] 18 (03/21 1150) BP: (79-138)/(55-80) 115/76 mmHg (03/21 1150) SpO2:  [98 %-100 %] 99 % (03/21 1150) Weight change:   Intake/Output from previous day: 03/20 0701 - 03/21 0700 In: 840 [P.O.:840] Out: -  Intake/Output this shift:    General appearance: alert and cooperative GI: soft, non-tender; bowel sounds normal; no masses,  no organomegaly Extremities: extremities normal, atraumatic, no cyanosis or edema  Lab Results:  Recent Labs  05/28/13 0904  WBC 12.3*  HGB 8.6*  HCT 26.3*  PLT 158   BMET:  Recent Labs  05/28/13 0904 05/29/13 0500  NA 134* 143  K 3.7 3.7  CL 92* 100  CO2 21 26  GLUCOSE 133* 94  BUN 39* 21  CREATININE 14.31* 9.43*  CALCIUM 7.6* 7.5*   No results found for this basename: PTH,  in the last 72 hours Iron Studies: No results found for this basename: IRON, TIBC, TRANSFERRIN, FERRITIN,  in the last 72 hours Studies/Results: No results found.  Scheduled: . calcitRIOL  1 mcg Oral Q T,Th,Sa-HD  . cinacalcet  60 mg Oral Q supper  . ferric gluconate (FERRLECIT/NULECIT) IV  62.5 mg Intravenous Q T,Th,Sa-HD  . heparin  5,000 Units Subcutaneous 3 times per day  . insulin aspart  0-9 Units Subcutaneous TID WC  . lanthanum  3,000 mg Oral TID WC  . metronidazole  500 mg Intravenous Q8H   And  . vancomycin  500 mg Oral 4 times per day  . multivitamin  1 tablet Oral QHS  . pregabalin  50 mg Oral QHS  . sodium chloride  3 mL Intravenous Q12H     LOS: 4 days   Leanna Hamid C 05/30/2013,1:43 PM

## 2013-05-30 NOTE — Progress Notes (Addendum)
[facility] PROGRESS NOTE  Randall [name] ZOX:[id number] DOB: 06-03-1965 DOA: 05/26/2013 PCP: [name]., MD  Assessment/Plan: Principal Problem:   C. Diff Diarrhea -Stool + for C.diff -continue oral vanc and flagyl as per protocol for severe c.diff, follow -continue supportive care -improving, plan dc in am if continues to improve Active Problems:  Hypotension/Sepsis  -Secondary to above, resolved with treatment as above including IVF -continue abx as above -pt evaluated by surgery and  and do not believe abdomen is the source of infection. -blood cultures so far neg  ESRD (end stage renal disease) on dialysis  -s/p PD cath removal -dialysis per renal Diet-controlled DM 2: Check CBGs, cover with SSI H/o Hypertension  - on no meds     Code Status: full Family Communication: none at bedside Disposition Plan: to home when medically ready   Consultants:  renal  Procedures: none Antibiotics:  vanc 3/17>>3/18  Zosyn 3/17>>3/18  Oral vanc 3/18>>  IV flagyl 3/18>>   HPI/Subjective: States beginning to firm up and less frequent.seen at dialysis  Objective: Filed Vitals:   05/30/13 1630  BP: 138/80  Pulse: 77  Temp:   Resp:     Intake/Output Summary (Last 24 hours) at 05/30/13 1659 Last data filed at 05/29/13 1900  Gross per 24 hour  Intake    360 ml  Output      0 ml  Net    360 ml   Filed Weights   05/28/13 1254 05/28/13 2050 05/30/13 1415  Weight: 123 kg (271 lb 2.7 oz) 123.5 kg (272 lb 4.3 oz) 125.4 kg (276 lb 7.3 oz)    Exam:  General: alert & oriented x  In NAD Cardiovascular: RRR, nl S1 s2 Respiratory: CTAB Abdomen: soft +BS NT/ND, no masses palpable Extremities: No cyanosis and no edema     Data Reviewed: Basic Metabolic Panel:  Recent Labs Lab 05/26/13 1410 05/27/13 0524 05/28/13 0904 05/29/13 0500 05/30/13 1446  NA 135* 137 134* 143 140  K 3.7 3.7 3.7 3.7 3.6*  CL 89* 95* 92* 100 100  CO2 27 22 21 26 24    GLUCOSE 117* 103* 133* 94 100*  BUN 20 28* 39* 21 34*  CREATININE 8.67* 11.09* 14.31* 9.43* 12.45*  CALCIUM 8.8 7.9* 7.6* 7.5* 7.2*  PHOS  --   --  5.7*  --   --    Liver Function Tests:  Recent Labs Lab 05/28/13 0904  ALBUMIN 2.6*   No results found for this basename: LIPASE, AMYLASE,  in the last 168 hours No results found for this basename: AMMONIA,  in the last 168 hours CBC:  Recent Labs Lab 05/26/13 1410 05/27/13 0524 05/28/13 0904 05/30/13 1446  WBC 10.7* 9.3 12.3* 3.9*  HGB 13.0 10.6* 8.6* 19.6*  HCT 39.5 33.0* 26.3* 58.3*  MCV 95.2 96.8 94.6 93.7  PLT 150 135* 158 68*   Cardiac Enzymes: No results found for this basename: CKTOTAL, CKMB, CKMBINDEX, TROPONINI,  in the last 168 hours BNP (last 3 results) No results found for this basename: PROBNP,  in the last 8760 hours CBG:  Recent Labs Lab 05/29/13 1339 05/29/13 1621 05/29/13 2219 05/30/13 0823 05/30/13 1150  GLUCAP 157* 98 75 104* 130*    Recent Results (from the past 240 hour(s))  CULTURE, BLOOD (ROUTINE X 2)     Status: None   Collection Time    05/26/13  2:10 PM      Result Value Ref Range Status   Specimen Description BLOOD RIGHT ANTECUBITAL  Final   Special Requests BOTTLES DRAWN AEROBIC AND ANAEROBIC 5ML   Final   Culture  Setup Time     Final   Value: 05/26/2013 16:05     Performed at [facility]   Culture     Final   Value:        BLOOD CULTURE RECEIVED NO GROWTH TO DATE CULTURE WILL BE HELD FOR 5 DAYS BEFORE ISSUING A FINAL NEGATIVE REPORT     Performed at [facility]   Report Status PENDING   Incomplete  CULTURE, BLOOD (ROUTINE X 2)     Status: None   Collection Time    05/26/13  3:30 PM      Result Value Ref Range Status   Specimen Description BLOOD RIGHT GROIN   Final   Special Requests BOTTLES DRAWN AEROBIC AND ANAEROBIC 5ML   Final   Culture  Setup Time     Final   Value: 05/26/2013 18:53     Performed at [facility]   Culture     Final   Value:         BLOOD CULTURE RECEIVED NO GROWTH TO DATE CULTURE WILL BE HELD FOR 5 DAYS BEFORE ISSUING A FINAL NEGATIVE REPORT     Performed at [facility]   Report Status PENDING   Incomplete  CLOSTRIDIUM DIFFICILE BY PCR     Status: Abnormal   Collection Time    05/26/13 11:40 PM      Result Value Ref Range Status   C difficile by pcr POSITIVE (*) NEGATIVE Final   Comment: CRITICAL RESULT CALLED TO, READ BACK BY AND VERIFIED WITH:     ALaveda Norman. TONEY RN 11:00 05/27/13 ([email or web address])  MRSA PCR SCREENING     Status: None   Collection Time    05/27/13  2:05 AM      Result Value Ref Range Status   MRSA by PCR NEGATIVE  NEGATIVE Final   Comment:            The GeneXpert MRSA Assay (FDA     approved for NASAL specimens     only), is one component of a     comprehensive MRSA colonization     surveillance program. It is not     intended to diagnose MRSA     infection nor to guide or     monitor treatment for     MRSA infections.     Studies: No results found.  Scheduled Meds: . calcitRIOL  1 mcg Oral Q T,Th,Sa-HD  . cinacalcet  60 mg Oral Q supper  . ferric gluconate (FERRLECIT/NULECIT) IV  62.5 mg Intravenous Q T,Th,Sa-HD  . heparin  5,000 Units Subcutaneous 3 times per day  . insulin aspart  0-9 Units Subcutaneous TID WC  . lanthanum  3,000 mg Oral TID WC  . metronidazole  500 mg Intravenous Q8H   And  . vancomycin  500 mg Oral 4 times per day  . multivitamin  1 tablet Oral QHS  . pregabalin  50 mg Oral QHS  . sodium chloride  3 mL Intravenous Q12H   Continuous Infusions:   Principal Problem:   Diarrhea Active Problems:   ESRD (end stage renal disease) on dialysis   Hypertension   Sepsis   Hypotension    Time spent: 25    [facility]entonVIYUOH,Natacia Chaisson C  [facility] Pager [phone]. If 7PM-7AM, please contact night-coverage at [email or web address], password [email or web address] 05/30/2013, 4:59 PM  LOS: 4 days

## 2013-05-31 LAB — CBC
HEMATOCRIT: 35.3 % — AB (ref 39.0–52.0)
Hemoglobin: 11.2 g/dL — ABNORMAL LOW (ref 13.0–17.0)
MCH: 30.4 pg (ref 26.0–34.0)
MCHC: 31.7 g/dL (ref 30.0–36.0)
MCV: 95.9 fL (ref 78.0–100.0)
Platelets: 201 10*3/uL (ref 150–400)
RBC: 3.68 MIL/uL — AB (ref 4.22–5.81)
RDW: 14.6 % (ref 11.5–15.5)
WBC: 8.3 10*3/uL (ref 4.0–10.5)

## 2013-05-31 LAB — BASIC METABOLIC PANEL
BUN: 19 mg/dL (ref 6–23)
CALCIUM: 7.9 mg/dL — AB (ref 8.4–10.5)
CO2: 27 meq/L (ref 19–32)
CREATININE: 7.83 mg/dL — AB (ref 0.50–1.35)
Chloride: 97 mEq/L (ref 96–112)
GFR calc Af Amer: 8 mL/min — ABNORMAL LOW (ref >90)
GFR calc non Af Amer: 7 mL/min — ABNORMAL LOW (ref >90)
Glucose, Bld: 97 mg/dL (ref 70–99)
Potassium: 4.3 mEq/L (ref 3.7–5.3)
Sodium: 139 mEq/L (ref 137–147)

## 2013-05-31 LAB — GLUCOSE, CAPILLARY
GLUCOSE-CAPILLARY: 139 mg/dL — AB (ref 70–99)
GLUCOSE-CAPILLARY: 94 mg/dL (ref 70–99)
Glucose-Capillary: 83 mg/dL (ref 70–99)

## 2013-05-31 LAB — CLOSTRIDIUM DIFFICILE BY PCR: CDIFFPCR: NEGATIVE

## 2013-05-31 MED ORDER — VANCOMYCIN 50 MG/ML ORAL SOLUTION: 500.0000 mg | Freq: Four times a day (QID) | ORAL | Status: DC

## 2013-05-31 MED ORDER — HEPARIN SODIUM (PORCINE) 1000 UNIT/ML IJ SOLN: 2500.0000 [IU] | Freq: Once | INTRAMUSCULAR | Status: DC

## 2013-05-31 MED ORDER — METRONIDAZOLE 500 MG PO TABS: 500.0000 mg | ORAL_TABLET | Freq: Three times a day (TID) | ORAL | Status: DC

## 2013-05-31 MED ORDER — RENA-VITE PO TABS: 1.0000 | ORAL_TABLET | Freq: Every day | ORAL | Status: AC

## 2013-05-31 NOTE — Progress Notes (Addendum)
Patient discharged.  Educated on discharge instructions, discharge medications, and follow-up appointments.  Prescriptions given to patient.  Patient verbalized understanding of discharge instructions.  Patient verbalized that he will make follow-up appointment with number given.  Patient given educational materials about c-diff of which he verbalized understanding.  IV removed.  Telemetry box 26 removed and returned, CCMD notified.  Belongings gathered.  Escorted to car via wheelchair with charge nurse

## 2013-05-31 NOTE — Discharge Summary (Signed)
Physician Discharge Summary  Randall [name]:[id number] DOB: 03-08-1966 DOA: 05/26/2013  PCP: [name]., MD  Admit date: 05/26/2013 Discharge date: 05/31/2013  Time spent: >79minutes  Recommendations for Outpatient Follow-up:  Follow-up Information   Follow up with [name]., MD. (in 1-2weeks, call for appt upon discharge)    Specialty:  Internal Medicine   Contact information:   [address] [place] [address] [phone]       Please follow up. (Dialysis/renal as directed)        Discharge Diagnoses:  Principal Problem:   Diarrhea Active Problems:   ESRD (end stage renal disease) on dialysis   Hypertension   Sepsis   Hypotension   Discharge Condition: Improved/stable  Diet recommendation: Renal diet  Filed Weights   05/30/13 1851 05/30/13 1946 05/31/13 0500  Weight: 123.9 kg (273 lb 2.4 oz) 123.5 kg (272 lb 4.3 oz) 121.2 kg (267 lb 3.2 oz)    History of present illness:  BARLOW Randall [name] is a 48 y.o. male with history of ESRD on TTS hemodialysis, removal of nonfunctioning peritoneal dialysis catheter last week, hyperlipidemia, metabolic bone disease, hypertension, diet-controlled DM 2, presented to the ED post dialysis on 3/17 with complaints of diarrhea, intermittent dizziness and weakness. Since Saturday, patient has noticed several episodes of watery, green, mildly foul-smelling, nonbloody diarrhea without associated nausea, vomiting or abdominal pain. Appetite has been reasonable. He had some subjective fevers yesterday. Denies recent antibiotic use or sickly contacts. He felt slightly dizzy intermittently today. He completed 4-1/2 hours of dialysis today and apparently had hypotension at dialysis. He states that he usually has some hypotension post dialysis. He was advised to come to the ED where initial blood pressure was 83/60 which improved after IV fluid bolus by EDP. He was febrile to. Chest x-ray negative. Noncontrasted CT  abdomen shows no acute intra-abdominal or intrapelvic abnormalities but shows a small subcutaneous fluid collection. Patient has been empirically started on IV vancomycin and Zosyn. He was admitted for further evaluation and management.   Hospital Course:  C. Diff Diarrhea  -As discussed above, upon admission Stool sample was sent and came back + for C.diff  -Following the above results he was placed on oral vanc and flagyl as per protocol for severe c.diff, follow  -He gradually improved with decreasing  diarrhea and abdominal pain and on followup today is no longer having diarrhea-formed stools, no further abdominal pain and is tolerating by mouth as well -she will be discharged on oral Vanc and Flagyl as above to complete a total of 14 days of antibiotics -His to follow up with his PCP. Active Problems:  Hypotension/Sepsis  -Secondary to above, resolved with treatment as above including IVF  -Initially the patient was placed on empiric IV antibiotics with vancomycin and Zosyn, following the stool studies that came back positive for C. difficile antibiotics were changed to oral vancomycin and Flagyl -Patient improved clinically as above with resolution of his hypotension  -pt evaluated by surgery on admission and ED and and do not believe abdomen is the source of infection.  -blood cultures done on admission remained negative -His blood pressures remained stable this a.m. and to be discharged on oral antibiotics as above follow up outpatient. ESRD (end stage renal disease) on dialysis  -s/p PD cath removal  -dialysis per renal  Diet-controlled DM 2: Check CBGs, -His Accu-Cheks were monitored and he was covered with sliding scale insulin while in the hospital.'s blood sugars range from 75-139 the  hospital. He was on no meds prior to admission.He is to follow up with his PCP discharge. H/o Hypertension  - on no meds      Procedures:  none  Consultations:  Renal  Discharge  Exam: Filed Vitals:   05/31/13 0815  BP: 118/68  Pulse: 80  Temp: 98.5 F (36.9 C)  Resp: 19   Exam:  General: alert & oriented x In NAD  Cardiovascular: RRR, nl S1 s2  Respiratory: CTAB  Abdomen: soft +BS NT/ND, no masses palpable  Extremities: No cyanosis and no edema    Discharge Instructions  Discharge Orders   Future Orders Complete By Expires   Diet renal 60/70-04-13-1198  As directed    Increase activity slowly  As directed        Medication List    STOP taking these medications       loperamide 2 MG capsule  Commonly known as:  IMODIUM      TAKE these medications       acetaminophen 325 MG tablet  Commonly known as:  TYLENOL  Take 650 mg by mouth every 6 (six) hours as needed for moderate pain.     ibuprofen 200 MG tablet  Commonly known as:  ADVIL,MOTRIN  Take 400 mg by mouth every 6 (six) hours as needed for moderate pain.     lanthanum 1000 MG chewable tablet  Commonly known as:  FOSRENOL  Chew 2,000-3,000 mg by mouth See admin instructions. Takes 3 tablets with meals and 2 tablets with snacks     loratadine 10 MG tablet  Commonly known as:  CLARITIN  Take 10 mg by mouth daily as needed for allergies.     metroNIDAZOLE 500 MG tablet  Commonly known as:  FLAGYL  Take 1 tablet (500 mg total) by mouth 3 (three) times daily.     multivitamin Tabs tablet  Take 1 tablet by mouth at bedtime.     pregabalin 50 MG capsule  Commonly known as:  LYRICA  Take 50 mg by mouth at bedtime.     SENSIPAR 60 MG tablet  Generic drug:  cinacalcet  Take 60 mg by mouth at bedtime.     vancomycin 50 mg/mL oral solution  Commonly known as:  VANCOCIN  Take 10 mLs (500 mg total) by mouth every 6 (six) hours.     white petrolatum Gel  Commonly known as:  VASELINE  Apply 1 application topically as needed for dry skin (on feet).       Allergies  Allergen Reactions  . Iohexol Itching    Pt had severe itching to head,face,upper chest after 80 ml Omni 300  administered.No obvious hives. I spoke to Dr Ruffin FrederickPop, and he didn't feel the need for Benadryl to be given.  KR       Follow-up Information   Follow up with Alva GarnetSHELTON,KIMBERLY R., MD. (in 1-2weeks, call for appt upon discharge)    Specialty:  Internal Medicine   Contact information:   [address] [place] [id number] [phone]       Please follow up. (Dialysis/renal as directed)        The results of significant diagnostics from this hospitalization (including imaging, microbiology, ancillary and laboratory) are listed below for reference.    Significant Diagnostic Studies: Ct Abdomen Pelvis Wo Contrast  05/26/2013   CLINICAL DATA:  Diarrhea, hypotension, end-stage renal disease on dialysis, fever, recent peritoneal dialysis catheter removal, past history diabetes, hypertension  EXAM: CT ABDOMEN AND PELVIS  WITHOUT CONTRAST  TECHNIQUE: Multidetector CT imaging of the abdomen and pelvis was performed following the standard protocol without intravenous contrast. Neither oral nor intravenous contrast utilized to history of contrast allergy.  COMPARISON:  04/03/2013  FINDINGS: Lung bases clear.  Prior laparoscopic gastric banding with normal angle of band on scout image and coronal view.  Post cholecystectomy.  Within limits of a nonenhanced exam, no focal abnormalities of the liver, spleen, and pancreas, kidneys, or adrenal glands.  Scattered atherosclerotic calcification aorta and coronary arteries.  Small umbilical hernia containing fat.  Small subcutaneous fluid collection in the anterior right abdomen, 4.3 x 3.3 x 4.9 cm, could represent edema, hemorrhage or infection, question related to site of prior peritoneal dialysis catheter removal.  High attenuation material in distal colon of uncertain etiology question radiodense medication.  Bowel loops grossly unremarkable for technique.  No mass, adenopathy, free intraperitoneal fluid, or free air.  No acute osseous findings.   IMPRESSION: Small umbilical hernia.  Small subcutaneous fluid collection in right anterior abdominal wall 4.3 x 3.3 x 4.9 cm in size, question related to removal of prior peritoneal dialysis catheter, could represent edema, hemorrhage or infection.  No acute intra-abdominal or intrapelvic abnormalities.   Electronically Signed   By: Ulyses Southward M.D.   On: 05/26/2013 16:11   Dg Chest Portable 1 View  05/26/2013   CLINICAL DATA:  Dizziness, chills and fever for 2 days, history hypertension, diabetes, end-stage renal disease on dialysis  EXAM: PORTABLE CHEST - 1 VIEW  COMPARISON:  Portable exam 1401 hr compared to 02/21/2013  FINDINGS: Normal heart size, mediastinal contours, and pulmonary vascularity.  Lungs clear.  No pleural effusion or pneumothorax.  Bones unremarkable.  Vascular stent at the left subclavian vessels again noted.  IMPRESSION: No acute abnormalities.   Electronically Signed   By: Ulyses Southward M.D.   On: 05/26/2013 14:11    Microbiology: Recent Results (from the past 240 hour(s))  CULTURE, BLOOD (ROUTINE X 2)     Status: None   Collection Time    05/26/13  2:10 PM      Result Value Ref Range Status   Specimen Description BLOOD RIGHT ANTECUBITAL   Final   Special Requests BOTTLES DRAWN AEROBIC AND ANAEROBIC   Final   Culture  Setup Time     Final   Value: 05/26/2013 16:05     Performed at [facility]   Culture     Final   Value:        BLOOD CULTURE RECEIVED NO GROWTH TO DATE CULTURE WILL BE HELD FOR 5 DAYS BEFORE ISSUING A FINAL NEGATIVE REPORT     Performed at [facility]   Report Status PENDING   Incomplete  CULTURE, BLOOD (ROUTINE X 2)     Status: None   Collection Time    05/26/13  3:30 PM      Result Value Ref Range Status   Specimen Description BLOOD RIGHT GROIN   Final   Special Requests BOTTLES DRAWN AEROBIC AND ANAEROBIC   Final   Culture  Setup Time     Final   Value: 05/26/2013 18:53     Performed at [facility]   Culture      Final   Value:        BLOOD CULTURE RECEIVED NO GROWTH TO DATE CULTURE WILL BE HELD FOR 5 DAYS BEFORE ISSUING A FINAL NEGATIVE REPORT     Performed at [facility]  Report Status PENDING   Incomplete  CLOSTRIDIUM DIFFICILE BY PCR     Status: Abnormal   Collection Time    05/26/13 11:40 PM      Result Value Ref Range Status   C difficile by pcr POSITIVE (*) NEGATIVE Final   Comment: CRITICAL RESULT CALLED TO, READ BACK BY AND VERIFIED WITH:     A. TONEY RN 11:00 05/27/13 ([email or web address])  MRSA PCR SCREENING     Status: None   Collection Time    05/27/13  2:05 AM      Result Value Ref Range Status   MRSA by PCR NEGATIVE  NEGATIVE Final   Comment:            The GeneXpert MRSA Assay (FDA     approved for NASAL specimens     only), is one component of a     comprehensive MRSA colonization     surveillance program. It is not     intended to diagnose MRSA     infection nor to guide or     monitor treatment for     MRSA infections.     Labs: Basic Metabolic Panel:  Recent Labs Lab 05/27/13 0524 05/28/13 0904 05/29/13 0500 05/30/13 1446 05/31/13 0830  NA 137 134* 143 140 139  K 3.7 3.7 3.7 3.6* 4.3  CL 95* 92* 100 100 97  CO2 22 21 26 24 27   GLUCOSE 103* 133* 94 100* 97  BUN 28* 39* 21 34* 19  CREATININE 11.09* 14.31* 9.43* 12.45* 7.83*  CALCIUM 7.9* 7.6* 7.5* 7.2* 7.9*  PHOS  --  5.7*  --   --   --    Liver Function Tests:  Recent Labs Lab 05/28/13 0904  ALBUMIN 2.6*   No results found for this basename: LIPASE, AMYLASE,  in the last 168 hours No results found for this basename: AMMONIA,  in the last 168 hours CBC:  Recent Labs Lab 05/26/13 1410 05/27/13 0524 05/28/13 0904 05/30/13 1446  WBC 10.7* 9.3 12.3* 3.9*  HGB 13.0 10.6* 8.6* 19.6*  HCT 39.5 33.0* 26.3* 58.3*  MCV 95.2 96.8 94.6 93.7  PLT 150 135* 158 68*   Cardiac Enzymes: No results found for this basename: CKTOTAL, CKMB, CKMBINDEX, TROPONINI,  in the last 168 hours BNP: BNP (last 3  results) No results found for this basename: PROBNP,  in the last 8760 hours CBG:  Recent Labs Lab 05/29/13 2219 05/30/13 0823 05/30/13 1150 05/30/13 2127 05/31/13 0814  GLUCAP 75 104* 130* 139* 83       Signed:  Callie Bunyard C  [facility] 05/31/2013, 10:56 AM

## 2013-05-31 NOTE — Progress Notes (Signed)
  [facility] Progress Note  Subjective:   Feels better with less diarrhea  Objective Filed Vitals:   05/30/13 1851 05/30/13 1946 05/31/13 0500 05/31/13 0815  BP: 126/81 132/88 100/63 118/68  Pulse: 82 85 78 80  Temp: 98.3 F (36.8 C) 98.7 F (37.1 C) 98.4 F (36.9 C) 98.5 F (36.9 C)  TempSrc: Oral Oral Oral Oral  Resp: 20 20 18 19   Height:      Weight: 123.9 kg (273 lb 2.4 oz) 123.5 kg (272 lb 4.3 oz) 121.2 kg (267 lb 3.2 oz)   SpO2: 99% 99% 94% 98%   Physical Exam General: a&O  Abdomen: obese Extremities: tr edema Dialysis Access: left upper AVF   Assessment/Plan: 1. C diff +  - for d/c on po Vanc  2. ESRD - TTS - K 4.3 today after added K bath yesterday 5. HTN/volume - 3/21 with post weight of 123.9 (EDW 121).  He should have lost weight but has not in hospital; cont same EDW for now 7. Disp - d/c today  Sheffield SliderMartha B Bergman, PA-C [facility] Beeper [phone] 05/31/2013,11:32 AM  LOS: 5 days  Renal Attending: The above note was modified and updated by me. Estalene Bergey C   Additional Objective Labs: Basic Metabolic Panel:  Recent Labs Lab 05/28/13 0904 05/29/13 0500 05/30/13 1446 05/31/13 0830  NA 134* 143 140 139  K 3.7 3.7 3.6* 4.3  CL 92* 100 100 97  CO2 21 26 24 27   GLUCOSE 133* 94 100* 97  BUN 39* 21 34* 19  CREATININE 14.31* 9.43* 12.45* 7.83*  CALCIUM 7.6* 7.5* 7.2* 7.9*  PHOS 5.7*  --   --   --    Liver Function Tests:  Recent Labs Lab 05/28/13 0904  ALBUMIN 2.6*  CBC:  Recent Labs Lab 05/26/13 1410 05/27/13 0524 05/28/13 0904 05/30/13 1446  WBC 10.7* 9.3 12.3* 3.9*  HGB 13.0 10.6* 8.6* 19.6*  HCT 39.5 33.0* 26.3* 58.3*  MCV 95.2 96.8 94.6 93.7  PLT 150 135* 158 68*   Blood Culture    Component Value Date/Time   SDES BLOOD RIGHT GROIN 05/26/2013 1530   SPECREQUEST BOTTLES DRAWN AEROBIC AND ANAEROBIC 5ML 05/26/2013 1530   CULT  Value:        BLOOD CULTURE RECEIVED NO GROWTH TO DATE CULTURE WILL BE  HELD FOR 5 DAYS BEFORE ISSUING A FINAL NEGATIVE REPORT [facility] 05/26/2013 1530   REPTSTATUS PENDING 05/26/2013 1530  CBG:  Recent Labs Lab 05/29/13 2219 05/30/13 0823 05/30/13 1150 05/30/13 2127 05/31/13 0814  GLUCAP 75 104* 130* 139* 83  Medications:   . calcitRIOL  1 mcg Oral Q T,Th,Sa-HD  . cinacalcet  60 mg Oral Q supper  . ferric gluconate (FERRLECIT/NULECIT) IV  62.5 mg Intravenous Q T,Th,Sa-HD  . heparin  5,000 Units Subcutaneous 3 times per day  . insulin aspart  0-9 Units Subcutaneous TID WC  . lanthanum  3,000 mg Oral TID WC  . metronidazole  500 mg Intravenous Q8H   And  . vancomycin  500 mg Oral 4 times per day  . multivitamin  1 tablet Oral QHS  . pregabalin  50 mg Oral QHS  . sodium chloride  3 mL Intravenous Q12H

## 2013-06-01 LAB — CULTURE, BLOOD (ROUTINE X 2)
CULTURE: NO GROWTH
Culture: NO GROWTH

## 2013-07-01 ENCOUNTER — Ambulatory Visit (INDEPENDENT_AMBULATORY_CARE_PROVIDER_SITE_OTHER): Payer: Medicare Other | Admitting: Neurology

## 2013-07-01 ENCOUNTER — Encounter: Payer: Self-pay | Admitting: Neurology

## 2013-07-01 VITALS — BP 122/75 | HR 96 | Resp 18 | Ht 70.5 in | Wt 270.0 lb

## 2013-07-01 DIAGNOSIS — N186 End stage renal disease: Secondary | ICD-10-CM

## 2013-07-01 DIAGNOSIS — Z992 Dependence on renal dialysis: Secondary | ICD-10-CM

## 2013-07-01 DIAGNOSIS — G4733 Obstructive sleep apnea (adult) (pediatric): Secondary | ICD-10-CM

## 2013-07-01 DIAGNOSIS — N039 Chronic nephritic syndrome with unspecified morphologic changes: Secondary | ICD-10-CM

## 2013-07-01 DIAGNOSIS — D631 Anemia in chronic kidney disease: Secondary | ICD-10-CM

## 2013-07-01 DIAGNOSIS — E1142 Type 2 diabetes mellitus with diabetic polyneuropathy: Secondary | ICD-10-CM

## 2013-07-01 DIAGNOSIS — E669 Obesity, unspecified: Secondary | ICD-10-CM

## 2013-07-01 DIAGNOSIS — I10 Essential (primary) hypertension: Secondary | ICD-10-CM

## 2013-07-01 MED ORDER — PREGABALIN 50 MG PO CAPS: 50.0000 mg | ORAL_CAPSULE | Freq: Every day | ORAL | Status: DC

## 2013-07-01 MED ORDER — PREGABALIN 25 MG PO CAPS: 25.0000 mg | ORAL_CAPSULE | Freq: Two times a day (BID) | ORAL | Status: DC

## 2013-07-01 NOTE — Patient Instructions (Signed)
Neuropathic Pain We often think that pain has a physical cause. If we get rid of the cause, the pain should go away. Nerves themselves can also cause pain. It is called neuropathic pain, which means nerve abnormality. It may be difficult for the patients who have it and for the treating caregivers. Pain is usually described as acute (short-lived) or chronic (long-lasting). Acute pain is related to the physical sensations caused by an injury. It can last from a few seconds to many weeks, but it usually goes away when normal healing occurs. Chronic pain lasts beyond the typical healing time. With neuropathic pain, the nerve fibers themselves may be damaged or injured. They then send incorrect signals to other pain centers. The pain you feel is real, but the cause is not easy to find.  CAUSES  Chronic pain can result from diseases, such as diabetes and shingles (an infection related to chickenpox), or from trauma, surgery, or amputation. It can also happen without any known injury or disease. The nerves are sending pain messages, even though there is no identifiable cause for such messages.   Other common causes of neuropathy include diabetes, phantom limb pain, or Regional Pain Syndrome (RPS).  As with all forms of chronic back pain, if neuropathy is not correctly treated, there can be a number of associated problems that lead to a downward cycle for the patient. These include depression, sleeplessness, feelings of fear and anxiety, limited social interaction and inability to do normal daily activities or work.  The most dramatic and mysterious example of neuropathic pain is called "phantom limb syndrome." This occurs when an arm or a leg has been removed because of illness or injury. The brain still gets pain messages from the nerves that originally carried impulses from the missing limb. These nerves now seem to misfire and cause troubling pain.  Neuropathic pain often seems to have no cause. It responds  poorly to standard pain treatment. Neuropathic pain can occur after:  Shingles (herpes zoster virus infection).  A lasting burning sensation of the skin, caused usually by injury to a peripheral nerve.  Peripheral neuropathy which is widespread nerve damage, often caused by diabetes or alcoholism.  Phantom limb pain following an amputation.  Facial nerve problems (trigeminal neuralgia).  Multiple sclerosis.  Reflex sympathetic dystrophy.  Pain which comes with cancer and cancer chemotherapy.  Entrapment neuropathy such as when pressure is put on a nerve such as in carpal tunnel syndrome.  Back, leg, and hip problems (sciatica).  Spine or back surgery.  HIV Infection or AIDS where nerves are infected by viruses. Your caregiver can explain items in the above list which may apply to you. SYMPTOMS  Characteristics of neuropathic pain are:  Severe, sharp, electric shock-like, shooting, lightening-like, knife-like.  Pins and needles sensation.  Deep burning, deep cold, or deep ache.  Persistent numbness, tingling, or weakness.  Pain resulting from light touch or other stimulus that would not usually cause pain.  Increased sensitivity to something that would normally cause pain, such as a pinprick. Pain may persist for months or years following the healing of damaged tissues. When this happens, pain signals no longer sound an alarm about current injuries or injuries about to happen. Instead, the alarm system itself is not working correctly.  Neuropathic pain may get worse instead of better over time. For some people, it can lead to serious disability. It is important to be aware that severe injury in a limb can occur without a proper, protective pain   response.Burns, cuts, and other injuries may go unnoticed. Without proper treatment, these injuries can become infected or lead to further disability. Take any injury seriously, and consult your caregiver for treatment. DIAGNOSIS    When you have a pain with no known cause, your caregiver will probably ask some specific questions:   Do you have any other conditions, such as diabetes, shingles, multiple sclerosis, or HIV infection?  How would you describe your pain? (Neuropathic pain is often described as shooting, stabbing, burning, or searing.)  Is your pain worse at any time of the day? (Neuropathic pain is usually worse at night.)  Does the pain seem to follow a certain physical pathway?  Does the pain come from an area that has missing or injured nerves? (An example would be phantom limb pain.)  Is the pain triggered by minor things such as rubbing against the sheets at night? These questions often help define the type of pain involved. Once your caregiver knows what is happening, treatment can begin. Anticonvulsant, antidepressant drugs, and various pain relievers seem to work in some cases. If another condition, such as diabetes is involved, better management of that disorder may relieve the neuropathic pain.  TREATMENT  Neuropathic pain is frequently long-lasting and tends not to respond to treatment with narcotic type pain medication. It may respond well to other drugs such as antiseizure and antidepressant medications. Usually, neuropathic problems do not completely go away, but partial improvement is often possible with proper treatment. Your caregivers have large numbers of medications available to treat you. Do not be discouraged if you do not get immediate relief. Sometimes different medications or a combination of medications will be tried before you receive the results you are hoping for. See your caregiver if you have pain that seems to be coming from nowhere and does not go away. Help is available.  SEEK IMMEDIATE MEDICAL CARE IF:   There is a sudden change in the quality of your pain, especially if the change is on only one side of the body.  You notice changes of the skin, such as redness, black or  purple discoloration, swelling, or an ulcer.  You cannot move the affected limbs. Document Released: 11/24/2003 Document Revised: 05/21/2011 Document Reviewed: 11/24/2003 [facility] Patient Information 2014 [place], [place]. Sleep Apnea Sleep apnea is disorder that affects a person's sleep. A person with sleep apnea has abnormal pauses in their breathing when they sleep. It is hard for them to get a good sleep. This makes a person tired during the day. It also can lead to other physical problems. There are three types of sleep apnea. One type is when breathing stops for a short time because your airway is blocked (obstructive sleep apnea). Another type is when the brain sometimes fails to give the normal signal to breathe to the muscles that control your breathing (central sleep apnea). The third type is a combination of the other two types. HOME CARE  Do not sleep on your back. Try to sleep on your side.  Take all medicine as told by your doctor.  Avoid alcohol, calming medicines (sedatives), and depressant drugs.  Try to lose weight if you are overweight. Talk to your doctor about a healthy weight goal. Your doctor may have you use a device that helps to open your airway. It can help you get the air that you need. It is called a positive airway pressure (PAP) device. There are three types of PAP devices:  Continuous positive airway pressure (CPAP) device.  Nasal expiratory positive airway pressure (EPAP) device.  Bilevel positive airway pressure (BPAP) device. MAKE SURE YOU:  Understand these instructions.  Will watch your condition.  Will get help right away if you are not doing well or get worse. Document Released: 12/06/2007 Document Revised: 02/13/2012 Document Reviewed: 06/30/2011 [facility] Patient Information 2014 [place], [place].

## 2013-07-01 NOTE — Progress Notes (Signed)
[facility]   Provider:  Melvyn Novas, [place]  Referring Provider: [name]., MD Primary Care Physician:  [name]., MD  Chief Complaint  Patient presents with  . New Evaluation    Room 11  . Diabetic neuropathy    HPI:  Randall [name] is a 48 y.o. afro-american  male , right handed, who  is seen here as a referral  from Dr. Renae Gloss  and Dr. Darrick Penna for a neurologic evaluation.   Mr. Kimmel is a history of diabetes mellitus, morbid obesity, hypertension, and depression. He developed renal failure at age 9, attributed to to the underlying medical conditions. He had undergone a  gastric bypass surgery in 1994 and initially lost a significant amount of weight ( 180 pounds), and his diabetes was controlled by diet. He regained a lot of weight over the last 3 years and developed a painfully diabetic neuropathy, described as numbness and stinging, and creating a  hindrance to exercise and to sleeping restfully.  He has been describing a stabbing and stinging sensation in the needle pokes at the bottom of his feet between the toes, and up to the ankle level. On his left foot medial ankle he does have an inflammation area that looks darkened and red - the skin on both lower extremities is shiny stretched and he has lost leg hair.  This dystrophic skin is typical for diabetic neuropathy with autonomic features.  He is treated with hemodialysis on Tuesdays, Thursdays, and Saturdays. He has a left arm AV fistula, which is patent. The begun taking gabapentin for neuropathic pain which did not work as well as the Lyrica that  he was later placed on . He states that as the dose  of Lyrica increased ,  his tremors and jitteriness increased a s well. He noted twitching of the  mouth and the fingers. Often his hands tremble and he also has sometimes trouble to keep his legs still.  His handwriting is not affected, but he has trouble to strike  the right key on the  keyboard , he strikes keys unnecessary. This is independent of dialysis times and days.        Review of Systems: Out of a complete 14 system review, the patient complains of only the following symptoms, and all other reviewed systems are negative. He writes on a computer works from home, is not gainfully employed,  He feels now jittery and his memory is impaired, he still sleeps poorly.  His nocturnal sleep has been less than 4 hours on average , he can not remember when he last slept 6 hours or more in night time. He is able to rise every morning at 5.45 AM and drives to dialysis, he feels ready to go.  He reports ED-Sleepiness and fatigue.  He sleeps during dialysis 3 times a week. He feels extremely fatigued after  HD treatments   His increasing weight has let him to snore again and he has a dry mouth in the AM. He is divorced and sleeps alone. He has no morning headaches.  He is anuretic.  He has not longer access to his CPAP machine , as it stayed at his former spouses house.    History   Social History  . Marital Status: Divorced    Spouse Name: N/A    Number of Children: 3  . Years of Education: College   Occupational History  . Not on file.   Social  History Main Topics  . Smoking status: Never Smoker   . Smokeless tobacco: Never Used  . Alcohol Use: No  . Drug Use: No  . Sexual Activity: Yes    Birth Control/ Protection: Condom   Other Topics Concern  . Not on file   Social History Narrative   Patient is single and lives with his son.   Patient has three children.   Patient is disabled.   Patient has a college education.   Patient is right-handed.   Patient drinks very little caffeine, maybe once a week.    Family History  Problem Relation Age of Onset  . Hypertension Mother   . Diabetes Mother   . Hypertension Father   . Diabetes Father     Past Medical History  Diagnosis Date  . Hyperlipidemia   . Thyroid disorder     takes  sensipar  . 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 Hyperthyroidism   . Hypertension     hx of off bp meds since 2012  . Sleep apnea     hx of sleep apnea prior to weight loss in 2012  . Diabetes mellitus     controlling by weight/diet  . ESRD on hemodialysis march 2012    [facility], ESRD due to DM/HTN  . Peritoneal dialysis status     oct 2014 to dec 2014    Past Surgical History  Procedure Laterality Date  . Gastric restriction surgery  12/06/09    lap band  . Dialysis fistula creation  2011  . Insertion of dialysis catheter  11/13/2011    Procedure: INSERTION OF DIALYSIS CATHETER;  Surgeon: Sherren Kernsharles E Fields, MD;  [facility];  Service: Vascular;  Laterality: Right;  Insertion of 23cm dialysis catheter in right IJ  . Av fistula placement  12/05/2011    Procedure: ARTERIOVENOUS (AV) FISTULA CREATION;  Surgeon: Larina Earthlyodd F Early, MD;  [facility];  Service: Vascular;  Laterality: Left;  . Av fistula placement  01-23-2012    #1 ligation of 2 competing branches of left upper arm AVF   #2  superifical mobilization of left upper arm AVF   . Capd removal N/A 05/18/2013    Procedure: LAPAROSCOPIC REMOVAL CONTINUOUS AMBULATORY PERITONEAL DIALYSIS  (CAPD) CATHETER;  Surgeon: Valarie MerinoMatthew B Martin, MD;  Location: [facility] ORS;  Service: General;  Laterality: N/A;    Current Outpatient Prescriptions  Medication Sig Dispense Refill  . acetaminophen (TYLENOL) 325 MG tablet Take 650 mg by mouth every 6 (six) hours as needed for moderate pain.      Marland Kitchen. ibuprofen (ADVIL,MOTRIN) 200 MG tablet Take 400 mg by mouth every 6 (six) hours as needed for moderate pain.      Marland Kitchen. lanthanum (FOSRENOL) 1000 MG chewable tablet Chew 2,000-3,000 mg by mouth See admin instructions. Takes 3 tablets with meals and 2 tablets with snacks      . loratadine (CLARITIN) 10 MG tablet Take 10 mg by mouth daily as needed for allergies.      . metroNIDAZOLE (FLAGYL) 500 MG tablet Take 1 tablet (500 mg total) by mouth 3 (three)  times daily.  30 tablet  0  . multivitamin (RENA-VIT) TABS tablet Take 1 tablet by mouth at bedtime.  30 tablet  0  . pregabalin (LYRICA) 50 MG capsule Take 1 capsule (50 mg total) by mouth daily. Patient takes 2 at night since March 2015, Dr. Darrick Pennaeterding.  60 capsule  0  . SENSIPAR 60 MG  tablet Take 60 mg by mouth at bedtime.       . vancomycin (VANCOCIN) 50 mg/mL oral solution Take 10 mLs (500 mg total) by mouth every 6 (six) hours.  400 mL  0  . white petrolatum (VASELINE) GEL Apply 1 application topically as needed for dry skin (on feet).       No current facility-administered medications for this visit.    Allergies as of 07/01/2013 - Review Complete 07/01/2013  Allergen Reaction Noted  . Iohexol Itching 02/24/2013    Vitals: BP 122/75  Pulse 96  Resp 18  Ht 5' 10.5" (1.791 m)  Wt 270 lb (122.471 kg)  BMI 38.18 kg/m2 Last Weight:  Wt Readings from Last 1 Encounters:  07/01/13 270 lb (122.471 kg)   Last Height:   Ht Readings from Last 1 Encounters:  07/01/13 5' 10.5" (1.791 m)    Physical exam:  General: The patient is awake, alert and appears not in acute distress. The patient is well groomed. Head: Normocephalic, atraumatic. Neck is supple. Mallampati 4 , neck circumference: 19 inches.  No retrognathia . Cardiovascular:  Regular rate and rhythm , without  murmurs or carotid bruit, and without distended neck veins. Respiratory: Lungs are clear to auscultation. Skin:  Without evidence of edema, or rash Trunk: BMI is elevated,  patient  has normal posture.  Neurologic exam : The patient is awake and alert, oriented to place and time.  Memory subjective  described as impaired . There is a normal attention span & concentration ability.  Speech is fluent without dysarthria, dysphonia or aphasia. Mood and affect are appropriate.  Cranial nerves: Pupils are equal and briskly reactive to light.  Extraocular movements  in vertical and horizontal planes intact and without  nystagmus. Visual fields by finger perimetry are intact. Hearing to finger rub intact.  Facial sensation intact to fine touch. Facial motor strength is symmetric and tongue and uvula move midline.  Motor exam:  Normal tone , muscle bulk and symmetric normal strength in all extremities.  Sensory:  Fine touch, pinprick and vibration were tested- his toes and ankles have lost sensory to filament touch and vibration, pin prick, temperature feeling is very limited.    Coordination: Rapid alternating movements in the fingers/hands is tested and normal.  Finger-to-nose maneuver tested and normal without evidence of ataxia, dysmetria or tremor.  Gait and station: Patient walks without assistive device ,climbs up to the exam table. Strength within normal limits. Stance is stable and normal. Steps are un fragmented.  Deep tendon reflexes: in the  upper and lower extremities are , attenuated . Babinski maneuver response is equivocal .   Assessment:  After physical and neurologic examination, review of laboratory studies, imaging, neurophysiology testing and pre-existing records, assessment is   1) Diabetic polyneuropathy, peripheral with mainly sensory component, small fiber affected.  Lyrica is a good medication, but he feels cognitivily slowed.  I would like to obtain a NCS .  I will reduce the LYRICA dose form 100 mg to 75 mg in PM  only. Take 2 hours before bedtime with applesauce .  2) Excessive daytime sleepiness in patient on hemodialysis. High BMI and fluid retention.   SPLIT study on a non -dialysis day with split at AHI 15 , score at 4 % .  patient may need adapt machine based on variable dry weight.

## 2013-07-04 ENCOUNTER — Emergency Department (HOSPITAL_COMMUNITY)
Admission: EM | Admit: 2013-07-04 | Discharge: 2013-07-04 | Disposition: A | Discharge status: Home / Self Care | Payer: Medicare Other | Attending: Emergency Medicine | Admitting: Emergency Medicine

## 2013-07-04 ENCOUNTER — Encounter (HOSPITAL_COMMUNITY): Payer: Self-pay | Admitting: Emergency Medicine

## 2013-07-04 ENCOUNTER — Emergency Department (HOSPITAL_COMMUNITY): Payer: Medicare Other

## 2013-07-04 DIAGNOSIS — R1084 Generalized abdominal pain: Secondary | ICD-10-CM | POA: Insufficient documentation

## 2013-07-04 DIAGNOSIS — E119 Type 2 diabetes mellitus without complications: Secondary | ICD-10-CM | POA: Insufficient documentation

## 2013-07-04 DIAGNOSIS — R112 Nausea with vomiting, unspecified: Secondary | ICD-10-CM | POA: Insufficient documentation

## 2013-07-04 DIAGNOSIS — E0590 Thyrotoxicosis, unspecified without thyrotoxic crisis or storm: Secondary | ICD-10-CM | POA: Insufficient documentation

## 2013-07-04 DIAGNOSIS — Z992 Dependence on renal dialysis: Secondary | ICD-10-CM | POA: Insufficient documentation

## 2013-07-04 DIAGNOSIS — I120 Hypertensive chronic kidney disease with stage 5 chronic kidney disease or end stage renal disease: Secondary | ICD-10-CM | POA: Insufficient documentation

## 2013-07-04 DIAGNOSIS — R109 Unspecified abdominal pain: Secondary | ICD-10-CM

## 2013-07-04 DIAGNOSIS — Z79899 Other long term (current) drug therapy: Secondary | ICD-10-CM | POA: Insufficient documentation

## 2013-07-04 DIAGNOSIS — N186 End stage renal disease: Secondary | ICD-10-CM | POA: Insufficient documentation

## 2013-07-04 LAB — COMPREHENSIVE METABOLIC PANEL
ALK PHOS: 110 U/L (ref 39–117)
AST: 62 U/L — AB (ref 0–37)
Albumin: 3.6 g/dL (ref 3.5–5.2)
BUN: 14 mg/dL (ref 6–23)
CALCIUM: 8.9 mg/dL (ref 8.4–10.5)
CO2: 26 mEq/L (ref 19–32)
Chloride: 92 mEq/L — ABNORMAL LOW (ref 96–112)
Creatinine, Ser: 5.88 mg/dL — ABNORMAL HIGH (ref 0.50–1.35)
GFR calc Af Amer: 12 mL/min — ABNORMAL LOW (ref >90)
GFR, EST NON AFRICAN AMERICAN: 10 mL/min — AB (ref >90)
GLUCOSE: 125 mg/dL — AB (ref 70–99)
Potassium: 6.9 mEq/L (ref 3.7–5.3)
SODIUM: 136 meq/L — AB (ref 137–147)
Total Bilirubin: 0.5 mg/dL (ref 0.3–1.2)
Total Protein: 9.8 g/dL — ABNORMAL HIGH (ref 6.0–8.3)

## 2013-07-04 LAB — CBC WITH DIFFERENTIAL/PLATELET
BASOS PCT: 0 % (ref 0–1)
Basophils Absolute: 0 10*3/uL (ref 0.0–0.1)
EOS ABS: 0 10*3/uL (ref 0.0–0.7)
EOS PCT: 0 % (ref 0–5)
HCT: 41.5 % (ref 39.0–52.0)
Hemoglobin: 13.6 g/dL (ref 13.0–17.0)
Lymphocytes Relative: 12 % (ref 12–46)
Lymphs Abs: 1.1 10*3/uL (ref 0.7–4.0)
MCH: 30.8 pg (ref 26.0–34.0)
MCHC: 32.8 g/dL (ref 30.0–36.0)
MCV: 94.1 fL (ref 78.0–100.0)
MONOS PCT: 5 % (ref 3–12)
Monocytes Absolute: 0.4 10*3/uL (ref 0.1–1.0)
Neutro Abs: 7.5 10*3/uL (ref 1.7–7.7)
Neutrophils Relative %: 83 % — ABNORMAL HIGH (ref 43–77)
PLATELETS: 195 10*3/uL (ref 150–400)
RBC: 4.41 MIL/uL (ref 4.22–5.81)
RDW: 15.4 % (ref 11.5–15.5)
WBC: 9 10*3/uL (ref 4.0–10.5)

## 2013-07-04 LAB — LIPASE, BLOOD: Lipase: 37 U/L (ref 11–59)

## 2013-07-04 LAB — POTASSIUM: Potassium: 4 mEq/L (ref 3.7–5.3)

## 2013-07-04 MED ORDER — ONDANSETRON HCL 4 MG/2ML IJ SOLN
4.0000 mg | Freq: Once | INTRAMUSCULAR | Status: AC
  Administered 2013-07-04: 4 mg via INTRAVENOUS
  Filled 2013-07-04: qty 2

## 2013-07-04 MED ORDER — MORPHINE SULFATE 4 MG/ML IJ SOLN
4.0000 mg | Freq: Once | INTRAMUSCULAR | Status: AC
  Administered 2013-07-04: 4 mg via INTRAVENOUS
  Filled 2013-07-04: qty 1

## 2013-07-04 MED ORDER — HYDROMORPHONE HCL PF 1 MG/ML IJ SOLN
1.0000 mg | Freq: Once | INTRAMUSCULAR | Status: AC
  Administered 2013-07-04: 1 mg via INTRAVENOUS
  Filled 2013-07-04: qty 1

## 2013-07-04 MED ORDER — PROMETHAZINE HCL 25 MG/ML IJ SOLN
12.5000 mg | Freq: Once | INTRAMUSCULAR | Status: AC
  Administered 2013-07-04: 12.5 mg via INTRAVENOUS
  Filled 2013-07-04: qty 1

## 2013-07-04 NOTE — ED Notes (Signed)
Patient states he cannot drink the contrast. States it makes him too nauseated. Refuses to try to drink anymore. Notified CT.

## 2013-07-04 NOTE — ED Provider Notes (Signed)
CSN: [id number]     Arrival date & time 07/04/13  1810 History   First MD Initiated Contact with Patient 07/04/13 1810     Chief Complaint  Patient presents with  . Abdominal Pain     (Consider location/radiation/quality/duration/timing/severity/associated sxs/prior Treatment) HPI Comments: Pt states that he went to dialysis today and on his way home he developed n/v and abdominal pain. No diarrhea.  He states that he feels week. Has been unable to keep down anything. Denies cp, sob, fever, cough  The history is provided by the patient. No language interpreter was used.    Past Medical History  Diagnosis Date  . Hyperlipidemia   . Thyroid disorder     takes sensipar  . 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 Hyperthyroidism   . Hypertension     hx of off bp meds since 2012  . Sleep apnea     hx of sleep apnea prior to weight loss in 2012  . Diabetes mellitus     controlling by weight/diet  . ESRD on hemodialysis march 2012    [facility] MWF, ESRD due to DM/HTN  . Peritoneal dialysis status     oct 2014 to dec 2014   Past Surgical History  Procedure Laterality Date  . Gastric restriction surgery  12/06/09    lap band  . Dialysis fistula creation  2011  . Insertion of dialysis catheter  11/13/2011    Procedure: INSERTION OF DIALYSIS CATHETER;  Surgeon: Sherren Kernsharles E Fields, MD;  [facility];  Service: Vascular;  Laterality: Right;  Insertion of 23cm dialysis catheter in right IJ  . Av fistula placement  12/05/2011    Procedure: ARTERIOVENOUS (AV) FISTULA CREATION;  Surgeon: Larina Earthlyodd F Early, MD;  [facility];  Service: Vascular;  Laterality: Left;  . Av fistula placement  01-23-2012    #1 ligation of 2 competing branches of left upper arm AVF   #2  superifical mobilization of left upper arm AVF   . Capd removal N/A 05/18/2013    Procedure: LAPAROSCOPIC REMOVAL CONTINUOUS AMBULATORY PERITONEAL DIALYSIS  (CAPD) CATHETER;  Surgeon: Valarie MerinoMatthew B Martin, MD;  Location: [facility] ORS;   Service: General;  Laterality: N/A;   Family History  Problem Relation Age of Onset  . Hypertension Mother   . Diabetes Mother   . Hypertension Father   . Diabetes Father    History  Substance Use Topics  . Smoking status: Never Smoker   . Smokeless tobacco: Never Used  . Alcohol Use: No    Review of Systems  Constitutional: Negative.   Respiratory: Negative.   Cardiovascular: Negative.   Gastrointestinal: Positive for abdominal pain.      Allergies  Iohexol  Home Medications   Prior to Admission medications   Medication Sig Start Date End Date Taking? Authorizing Provider  acetaminophen (TYLENOL) 325 MG tablet Take 650 mg by mouth every 6 (six) hours as needed for moderate pain.   Yes Historical Provider, MD  lanthanum (FOSRENOL) 1000 MG chewable tablet Chew 2,000-3,000 mg by mouth See admin instructions. Takes 3 tablets with meals and 2 tablets with snacks   Yes Historical Provider, MD  multivitamin (RENA-VIT) TABS tablet Take 1 tablet by mouth at bedtime. 05/31/13  Yes Adeline Joselyn Glassman [name], MD  pregabalin (LYRICA) 50 MG capsule Take 1 capsule (50 mg total) by mouth daily. Patient takes 2 at night since March 2015, Dr. Darrick Pennaeterding. 07/01/13  Yes Melvyn Novasarmen Dohmeier, MD  SENSIPAR 60 MG tablet  Take 60 mg by mouth at bedtime.  11/27/12  Yes Historical Provider, MD  vancomycin (VANCOCIN) 50 mg/mL oral solution Take 500 mg by mouth 2 (two) times daily. Started on 4/23 for 10 days   Yes Historical Provider, MD  white petrolatum (VASELINE) GEL Apply 1 application topically as needed for dry skin (on feet).   Yes Historical Provider, MD  pregabalin (LYRICA) 25 MG capsule Take 1 capsule (25 mg total) by mouth 2 (two) times daily. 07/01/13   Porfirio Mylararmen Dohmeier, MD   BP 136/75  Pulse 79  Temp(Src) 98.4 F (36.9 C) (Oral)  Resp 18  Ht 5\' 11"  (1.803 m)  Wt 267 lb (121.11 kg)  BMI 37.26 kg/m2  SpO2 100% Physical Exam  Nursing note and vitals reviewed. Constitutional: He is oriented to person,  place, and time. He appears well-developed and well-nourished.  HENT:  Head: Normocephalic and atraumatic.  Cardiovascular: Normal rate and regular rhythm.   Pulmonary/Chest: Effort normal and breath sounds normal.  Abdominal: Soft. Bowel sounds are normal. There is no tenderness.  Diffuse tenderness  Musculoskeletal: Normal range of motion.  Neurological: He is alert and oriented to person, place, and time. Coordination normal.  Skin: Skin is warm and dry.  Psychiatric: He has a normal mood and affect.    ED Course  Procedures (including critical care time) Labs Review Labs Reviewed  COMPREHENSIVE METABOLIC PANEL  LIPASE, BLOOD  CBC WITH DIFFERENTIAL    Imaging Review No results found.   EKG Interpretation None      MDM   Final diagnoses:  None    Left with Dr. Juleen Chinakohut for follow up on ct and labs    Teressa LowerVrinda Destony Prevost, NP 07/04/13 2011

## 2013-07-04 NOTE — ED Notes (Signed)
Woke patient up to remind him to drink CT contrast.

## 2013-07-04 NOTE — ED Notes (Signed)
Patient transported to CT 

## 2013-07-04 NOTE — ED Notes (Signed)
Patient returned from CT

## 2013-07-04 NOTE — ED Notes (Signed)
Dialysis completed today; on the way home started having N/V and abdominal pain. Unable to keep food or liquid down. States feels short of breath and weak.

## 2013-07-04 NOTE — Discharge Instructions (Signed)
Abdominal Pain, Adult °Many things can cause abdominal pain. Usually, abdominal pain is not caused by a disease and will improve without treatment. It can often be observed and treated at home. Your health care provider will do a physical exam and possibly order blood tests and X-rays to help determine the seriousness of your pain. However, in many cases, more time must pass before a clear cause of the pain can be found. Before that point, your health care provider may not know if you need more testing or further treatment. °HOME CARE INSTRUCTIONS  °Monitor your abdominal pain for any changes. The following actions may help to alleviate any discomfort you are experiencing: °· Only take over-the-counter or prescription medicines as directed by your health care provider. °· Do not take laxatives unless directed to do so by your health care provider. °· Try a clear liquid diet (broth, tea, or water) as directed by your health care provider. Slowly move to a bland diet as tolerated. °SEEK MEDICAL CARE IF: °· You have unexplained abdominal pain. °· You have abdominal pain associated with nausea or diarrhea. °· You have pain when you urinate or have a bowel movement. °· You experience abdominal pain that wakes you in the night. °· You have abdominal pain that is worsened or improved by eating food. °· You have abdominal pain that is worsened with eating fatty foods. °SEEK IMMEDIATE MEDICAL CARE IF:  °· Your pain does not go away within 2 hours. °· You have a fever. °· You keep throwing up (vomiting). °· Your pain is felt only in portions of the abdomen, such as the right side or the left lower portion of the abdomen. °· You pass bloody or black tarry stools. °MAKE SURE YOU: °· Understand these instructions.   °· Will watch your condition.   °· Will get help right away if you are not doing well or get worse.   °Document Released: 12/06/2004 Document Revised: 12/17/2012 Document Reviewed: 11/05/2012 °[employer]® Patient  Information ©2014 [employer]. ° °

## 2013-07-05 NOTE — ED Provider Notes (Signed)
  This was a shared visit with a mid-level provided (NP or PA).  Throughout the patient's course I was available for consultation/collaboration.   On my exam the patient was in no distress.  With the patient's abdominal pain, and consistent description of the pain, though with multiple medical problems, including end-stage renal disease, we performed a CT scan, labs. On sign-out these studies were pending.       Gerhard Munchobert Ismail Graziani, MD 07/05/13 1300

## 2013-07-06 ENCOUNTER — Observation Stay (HOSPITAL_COMMUNITY)
Admission: EM | Admit: 2013-07-06 | Discharge: 2013-07-07 | Disposition: A | Discharge status: Home / Self Care | Payer: Medicare Other | Attending: General Surgery | Admitting: General Surgery

## 2013-07-06 ENCOUNTER — Encounter (HOSPITAL_COMMUNITY): Payer: Self-pay | Admitting: Emergency Medicine

## 2013-07-06 ENCOUNTER — Emergency Department (HOSPITAL_COMMUNITY): Payer: Medicare Other

## 2013-07-06 DIAGNOSIS — R0602 Shortness of breath: Secondary | ICD-10-CM | POA: Insufficient documentation

## 2013-07-06 DIAGNOSIS — E1142 Type 2 diabetes mellitus with diabetic polyneuropathy: Secondary | ICD-10-CM | POA: Insufficient documentation

## 2013-07-06 DIAGNOSIS — N186 End stage renal disease: Secondary | ICD-10-CM | POA: Insufficient documentation

## 2013-07-06 DIAGNOSIS — R109 Unspecified abdominal pain: Principal | ICD-10-CM | POA: Insufficient documentation

## 2013-07-06 DIAGNOSIS — Z992 Dependence on renal dialysis: Secondary | ICD-10-CM | POA: Insufficient documentation

## 2013-07-06 DIAGNOSIS — E669 Obesity, unspecified: Secondary | ICD-10-CM | POA: Insufficient documentation

## 2013-07-06 DIAGNOSIS — N039 Chronic nephritic syndrome with unspecified morphologic changes: Secondary | ICD-10-CM

## 2013-07-06 DIAGNOSIS — I120 Hypertensive chronic kidney disease with stage 5 chronic kidney disease or end stage renal disease: Secondary | ICD-10-CM | POA: Insufficient documentation

## 2013-07-06 DIAGNOSIS — E1129 Type 2 diabetes mellitus with other diabetic kidney complication: Secondary | ICD-10-CM | POA: Insufficient documentation

## 2013-07-06 DIAGNOSIS — G4733 Obstructive sleep apnea (adult) (pediatric): Secondary | ICD-10-CM | POA: Insufficient documentation

## 2013-07-06 DIAGNOSIS — Z6838 Body mass index (BMI) 38.0-38.9, adult: Secondary | ICD-10-CM | POA: Insufficient documentation

## 2013-07-06 DIAGNOSIS — Z9884 Bariatric surgery status: Secondary | ICD-10-CM | POA: Insufficient documentation

## 2013-07-06 DIAGNOSIS — D631 Anemia in chronic kidney disease: Secondary | ICD-10-CM | POA: Insufficient documentation

## 2013-07-06 DIAGNOSIS — E785 Hyperlipidemia, unspecified: Secondary | ICD-10-CM | POA: Insufficient documentation

## 2013-07-06 DIAGNOSIS — B370 Candidal stomatitis: Secondary | ICD-10-CM | POA: Insufficient documentation

## 2013-07-06 DIAGNOSIS — E1149 Type 2 diabetes mellitus with other diabetic neurological complication: Secondary | ICD-10-CM | POA: Insufficient documentation

## 2013-07-06 DIAGNOSIS — E0590 Thyrotoxicosis, unspecified without thyrotoxic crisis or storm: Secondary | ICD-10-CM | POA: Insufficient documentation

## 2013-07-06 DIAGNOSIS — R112 Nausea with vomiting, unspecified: Secondary | ICD-10-CM | POA: Diagnosis present

## 2013-07-06 LAB — I-STAT TROPONIN, ED: TROPONIN I, POC: 0.01 ng/mL (ref 0.00–0.08)

## 2013-07-06 LAB — BASIC METABOLIC PANEL
BUN: 40 mg/dL — AB (ref 6–23)
CALCIUM: 7.8 mg/dL — AB (ref 8.4–10.5)
CO2: 23 mEq/L (ref 19–32)
CREATININE: 11.66 mg/dL — AB (ref 0.50–1.35)
Chloride: 92 mEq/L — ABNORMAL LOW (ref 96–112)
GFR calc Af Amer: 5 mL/min — ABNORMAL LOW (ref >90)
GFR calc non Af Amer: 4 mL/min — ABNORMAL LOW (ref >90)
GLUCOSE: 92 mg/dL (ref 70–99)
Potassium: 4.5 mEq/L (ref 3.7–5.3)
Sodium: 140 mEq/L (ref 137–147)

## 2013-07-06 LAB — PRO B NATRIURETIC PEPTIDE: PRO B NATRI PEPTIDE: 2722 pg/mL — AB (ref 0–125)

## 2013-07-06 LAB — LIPASE, BLOOD: Lipase: 24 U/L (ref 11–59)

## 2013-07-06 MED ORDER — ONDANSETRON 4 MG PO TBDP
8.0000 mg | ORAL_TABLET | Freq: Once | ORAL | Status: AC
  Administered 2013-07-06: 8 mg via ORAL
  Filled 2013-07-06: qty 2

## 2013-07-06 MED ORDER — OXYCODONE-ACETAMINOPHEN 5-325 MG PO TABS
1.0000 | ORAL_TABLET | Freq: Once | ORAL | Status: AC
  Administered 2013-07-06: 1 via ORAL
  Filled 2013-07-06: qty 1

## 2013-07-06 MED ORDER — MORPHINE SULFATE 4 MG/ML IJ SOLN: 6.0000 mg | Freq: Once | INTRAMUSCULAR | Status: DC

## 2013-07-06 MED ORDER — SODIUM CHLORIDE 0.9 % IV SOLN
Freq: Once | INTRAVENOUS | Status: AC
  Administered 2013-07-06: 23:00:00 via INTRAVENOUS

## 2013-07-06 MED ORDER — MORPHINE SULFATE 4 MG/ML IJ SOLN
6.0000 mg | Freq: Once | INTRAMUSCULAR | Status: AC
  Administered 2013-07-06: 6 mg via INTRAMUSCULAR

## 2013-07-06 MED ORDER — MORPHINE SULFATE 4 MG/ML IJ SOLN
6.0000 mg | Freq: Once | INTRAMUSCULAR | Status: DC
  Filled 2013-07-06: qty 2

## 2013-07-06 MED ORDER — HYDROMORPHONE HCL PF 1 MG/ML IJ SOLN
1.0000 mg | Freq: Once | INTRAMUSCULAR | Status: AC
  Administered 2013-07-06: 1 mg via INTRAVENOUS
  Filled 2013-07-06: qty 1

## 2013-07-06 MED ORDER — ONDANSETRON HCL 4 MG/2ML IJ SOLN
4.0000 mg | Freq: Once | INTRAMUSCULAR | Status: AC
  Administered 2013-07-06: 4 mg via INTRAVENOUS
  Filled 2013-07-06: qty 2

## 2013-07-06 MED ORDER — ONDANSETRON 4 MG PO TBDP
4.0000 mg | ORAL_TABLET | Freq: Once | ORAL | Status: DC
  Filled 2013-07-06: qty 1

## 2013-07-06 MED ORDER — ONDANSETRON 4 MG PO TBDP
4.0000 mg | ORAL_TABLET | Freq: Once | ORAL | Status: AC
  Administered 2013-07-06: 4 mg via ORAL

## 2013-07-06 NOTE — ED Provider Notes (Signed)
Medical screening examination/treatment/procedure(s) were conducted as a shared visit with non-physician practitioner(s) and myself.  I personally evaluated the patient during the encounter.   EKG Interpretation None       Doug SouSam Indigo Chaddock, MD 07/06/13 2359

## 2013-07-06 NOTE — ED Notes (Signed)
Pt continues to come to desk to request to be placed in room. Apologized again for wait time. Pt given pain medications under protocol.

## 2013-07-06 NOTE — ED Notes (Signed)
Pt found asleep in the waiting room when called for reassessment

## 2013-07-06 NOTE — ED Notes (Signed)
Pt requesting to lay on floor. States "my stomach hurts". Pt made aware for his safety and our safety he needs to stay in wheelchair. Pt kept in view of triage nurses for his comfort. NAD.

## 2013-07-06 NOTE — ED Notes (Signed)
Pt reports lower abdominal pain with nausea since Sat. (seen here for same). Pt also reports central CP since Saturday, described as stabbing with SOB and lightheadedness. Pt last received dialysis Sat. VSS.

## 2013-07-06 NOTE — ED Provider Notes (Signed)
CSN: [id number]     Arrival date & time 07/06/13  1310 History   First MD Initiated Contact with Patient 07/06/13 1953     Chief Complaint  Patient presents with  . Chest Pain  . Abdominal Pain     (Consider location/radiation/quality/duration/timing/severity/associated sxs/prior Treatment) HPI Comments: [name] is a 48 year old, African American male, who was assessed on Saturday for abdominal pain, which included lab work, and CT scan, without any, pathology.  He's had persistent pain since that time, associated with nausea.  This started after his dialysis session, today.  He's also complaining of shortness of breath, and chest pain.  He is due for dialysis tomorrow.  He has not taken any medication for his discomfort. His abdominal pain is associated with nausea.  He also states he has not had a bowel movement.  Since Saturday, on review his CT scan does not go show constipation. He, states he spoke with his doctor Andi Devon, by phone today, and was is advised to come to the emergency department.    Patient is a 48 y.o. male presenting with chest pain and abdominal pain. The history is provided by the patient.  Chest Pain Pain location:  Substernal area Pain quality: aching   Pain radiates to:  Does not radiate Pain radiates to the back: no   Pain severity:  Moderate Onset quality:  Unable to specify Duration:  1 day Timing:  Unable to specify Progression:  Unable to specify Chronicity:  New Context: eating   Context: not breathing, no drug use, no movement, not raising an arm, not at rest and no stress   Relieved by:  None tried Exacerbated by: eating. Ineffective treatments:  None tried Associated symptoms: abdominal pain, nausea and shortness of breath   Associated symptoms: no cough, no diaphoresis, no dysphagia, no fever, no headache and no syncope   Abdominal pain:    Location:  Generalized   Quality:  Aching   Severity:  Moderate   Onset quality:  Unable to  specify   Timing:  Constant   Progression:  Worsening   Chronicity:  New Nausea:    Severity:  Moderate   Onset quality:  Unable to specify   Timing:  Intermittent   Progression:  Unchanged Shortness of breath:    Severity:  Moderate   Duration:  1 day   Timing:  Constant   Progression:  Worsening Risk factors: male sex   Risk factors: no Ehlers-Danlos syndrome, no high cholesterol and not obese   Risk factors comment:  ESRD Abdominal Pain Associated symptoms: chest pain, constipation, nausea and shortness of breath   Associated symptoms: no cough and no fever     Past Medical History  Diagnosis Date  . Hyperlipidemia   . Thyroid disorder     takes sensipar  . 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 Hyperthyroidism   . Hypertension     hx of off bp meds since 2012  . Sleep apnea     hx of sleep apnea prior to weight loss in 2012  . Diabetes mellitus     controlling by weight/diet  . ESRD on hemodialysis march 2012    [facility] on MWF, ESRD due to DM/HTN  . Peritoneal dialysis status     oct 2014 to dec 2014   Past Surgical History  Procedure Laterality Date  . Gastric restriction surgery  12/06/09    lap band  . Dialysis fistula creation  2011  .  Insertion of dialysis catheter  11/13/2011    Procedure: INSERTION OF DIALYSIS CATHETER;  Surgeon: Sherren Kernsharles E Fields, MD;  [facility];  Service: Vascular;  Laterality: Right;  Insertion of 23cm dialysis catheter in right IJ  . Av fistula placement  12/05/2011    Procedure: ARTERIOVENOUS (AV) FISTULA CREATION;  Surgeon: Larina Earthlyodd F Early, MD;  [facility];  Service: Vascular;  Laterality: Left;  . Av fistula placement  01-23-2012    #1 ligation of 2 competing branches of left upper arm AVF   #2  superifical mobilization of left upper arm AVF   . Capd removal N/A 05/18/2013    Procedure: LAPAROSCOPIC REMOVAL CONTINUOUS AMBULATORY PERITONEAL DIALYSIS  (CAPD) CATHETER;  Surgeon: Valarie MerinoMatthew B Martin, MD;  Location: [facility] ORS;   Service: General;  Laterality: N/A;   Family History  Problem Relation Age of Onset  . Hypertension Mother   . Diabetes Mother   . Hypertension Father   . Diabetes Father    History  Substance Use Topics  . Smoking status: Never Smoker   . Smokeless tobacco: Never Used  . Alcohol Use: No    Review of Systems  Constitutional: Negative for fever and diaphoresis.  HENT: Negative for trouble swallowing.   Respiratory: Positive for shortness of breath. Negative for cough and chest tightness.   Cardiovascular: Positive for chest pain. Negative for leg swelling and syncope.  Gastrointestinal: Positive for nausea, abdominal pain and constipation.  Skin: Negative for rash and wound.  Neurological: Negative for headaches.  All other systems reviewed and are negative.     Allergies  Iohexol  Home Medications   Prior to Admission medications   Medication Sig Start Date End Date Taking? Authorizing Provider  acetaminophen (TYLENOL) 325 MG tablet Take 650 mg by mouth every 6 (six) hours as needed for moderate pain.   Yes Historical Provider, MD  lanthanum (FOSRENOL) 1000 MG chewable tablet Chew 2,000-3,000 mg by mouth See admin instructions. Takes 3 tablets with meals and 2 tablets with snacks   Yes Historical Provider, MD  multivitamin (RENA-VIT) TABS tablet Take 1 tablet by mouth at bedtime. 05/31/13  Yes Adeline Joselyn Glassman [name], MD  pregabalin (LYRICA) 25 MG capsule Take 1 capsule (25 mg total) by mouth 2 (two) times daily. 07/01/13  Yes Carmen Dohmeier, MD  pregabalin (LYRICA) 50 MG capsule Take 1 capsule (50 mg total) by mouth daily. Patient takes 2 at night since March 2015, Dr. Darrick Pennaeterding. 07/01/13  Yes Carmen Dohmeier, MD  SENSIPAR 60 MG tablet Take 60 mg by mouth at bedtime.  11/27/12  Yes Historical Provider, MD  vancomycin (VANCOCIN) 50 mg/mL oral solution Take 500 mg by mouth 2 (two) times daily. Started on 4/23 for 10 days   Yes Historical Provider, MD  white petrolatum (VASELINE) GEL  Apply 1 application topically as needed for dry skin (on feet).   Yes Historical Provider, MD   BP 150/75  Pulse 88  Temp(Src) 98.1 F (36.7 C) (Oral)  Resp 20  Ht 5\' 10"  (1.778 m)  Wt 267 lb (121.11 kg)  BMI 38.31 kg/m2  SpO2 96% Physical Exam  Vitals reviewed. Constitutional: He is oriented to person, place, and time. He appears well-developed and well-nourished.  HENT:  Head: Normocephalic.  Eyes: Pupils are equal, round, and reactive to light.  Neck: Normal range of motion.  Cardiovascular: Normal rate and regular rhythm.   Pulmonary/Chest: Effort normal and breath sounds normal. No respiratory distress. He has no wheezes. He exhibits no tenderness.  Abdominal: Soft. He exhibits no distension. There is generalized tenderness.  Musculoskeletal: Normal range of motion. He exhibits no edema.  Neurological: He is alert and oriented to person, place, and time.  Skin: Skin is warm.    ED Course  Procedures (including critical care time) Labs Review Labs Reviewed  BASIC METABOLIC PANEL - Abnormal; Notable for the following:    Chloride 92 (*)    BUN 40 (*)    Creatinine, Ser 11.66 (*)    Calcium 7.8 (*)    GFR calc non Af Amer 4 (*)    GFR calc Af Amer 5 (*)    All other components within normal limits  PRO B NATRIURETIC PEPTIDE - Abnormal; Notable for the following:    Pro B Natriuretic peptide (BNP) 2722.0 (*)    All other components within normal limits  LIPASE, BLOOD  CBC WITH DIFFERENTIAL  Rosezena SensorI-STAT TROPOININ, ED    Imaging Review Dg Chest 2 View  07/06/2013   CLINICAL DATA:  Chest pain.  EXAM: CHEST  2 VIEW  COMPARISON:  Chest radiograph performed 05/26/2013  FINDINGS: The lungs are well-aerated and clear. There is no evidence of focal opacification, pleural effusion or pneumothorax.  The heart is normal in size; the mediastinal contour is within normal limits. No acute osseous abnormalities are seen.  IMPRESSION: No acute cardiopulmonary process seen.   Electronically  Signed   By: Roanna RaiderJeffery  Chang M.D.   On: 07/06/2013 21:26     EKG Interpretation None      MDM  On further questioning patient now admits to previous episodes of similar abdominal pain.  Do, to his or lap band and states, that, once.  He's had his lap band adjusted.  He no longer has pain.  He did not call Dr. Daphine DeutscherMartin who placed his lap band Dr. Carolynne Edouardoth will come evaluate patient admit for observation and have Bariatric surgeon see patient in morning  Final diagnoses:  Abdominal pain         Arman FilterGail K Rahul Malinak, NP 07/06/13 2330  Arman FilterGail K Dariya Gainer, NP 07/06/13 2331  Arman FilterGail K Keena Heesch, NP 07/06/13 2337

## 2013-07-06 NOTE — ED Provider Notes (Signed)
She complains of diffuse abdominal pain onset 2 days ago he reports that he had similar pain in the past as a result of his lap band being too tight.. He hasn't eaten in 2 days. His last bowel movement was 2 days ago. He admits to dry heaves. He denies fever. Denies chest pain. Seen here 2 days ago for same complaint had CT scan of abdomen and pelvis which was unremarkable. Eye exam patient is alert nontoxic lungs clear. Heart regular rate abdomen nondistended diffusely tender. Left upper extremity with dialysis fistula with good thrill. Spoke with Dr.Toth who will come to evaluate patient in ED.   Doug SouSam Richetta Cubillos, MD 07/06/13 2330

## 2013-07-07 DIAGNOSIS — R112 Nausea with vomiting, unspecified: Secondary | ICD-10-CM | POA: Diagnosis present

## 2013-07-07 LAB — CBC WITH DIFFERENTIAL/PLATELET
BASOS ABS: 0 10*3/uL (ref 0.0–0.1)
BASOS PCT: 0 % (ref 0–1)
EOS ABS: 0 10*3/uL (ref 0.0–0.7)
EOS PCT: 0 % (ref 0–5)
HCT: 37.5 % — ABNORMAL LOW (ref 39.0–52.0)
Hemoglobin: 12.1 g/dL — ABNORMAL LOW (ref 13.0–17.0)
LYMPHS PCT: 17 % (ref 12–46)
Lymphs Abs: 1.8 10*3/uL (ref 0.7–4.0)
MCH: 30.3 pg (ref 26.0–34.0)
MCHC: 32.3 g/dL (ref 30.0–36.0)
MCV: 93.8 fL (ref 78.0–100.0)
Monocytes Absolute: 0.8 10*3/uL (ref 0.1–1.0)
Monocytes Relative: 8 % (ref 3–12)
Neutro Abs: 8.3 10*3/uL — ABNORMAL HIGH (ref 1.7–7.7)
Neutrophils Relative %: 75 % (ref 43–77)
PLATELETS: 217 10*3/uL (ref 150–400)
RBC: 4 MIL/uL — ABNORMAL LOW (ref 4.22–5.81)
RDW: 15.2 % (ref 11.5–15.5)
WBC: 11 10*3/uL — ABNORMAL HIGH (ref 4.0–10.5)

## 2013-07-07 LAB — BASIC METABOLIC PANEL
BUN: 48 mg/dL — AB (ref 6–23)
CALCIUM: 7.2 mg/dL — AB (ref 8.4–10.5)
CO2: 24 meq/L (ref 19–32)
Chloride: 98 mEq/L (ref 96–112)
Creatinine, Ser: 13.42 mg/dL — ABNORMAL HIGH (ref 0.50–1.35)
GFR calc Af Amer: 4 mL/min — ABNORMAL LOW (ref >90)
GFR, EST NON AFRICAN AMERICAN: 4 mL/min — AB (ref >90)
GLUCOSE: 77 mg/dL (ref 70–99)
Potassium: 4.2 mEq/L (ref 3.7–5.3)
SODIUM: 144 meq/L (ref 137–147)

## 2013-07-07 LAB — CBC
HCT: 33.5 % — ABNORMAL LOW (ref 39.0–52.0)
Hemoglobin: 10.6 g/dL — ABNORMAL LOW (ref 13.0–17.0)
MCH: 29.8 pg (ref 26.0–34.0)
MCHC: 31.6 g/dL (ref 30.0–36.0)
MCV: 94.1 fL (ref 78.0–100.0)
PLATELETS: 212 10*3/uL (ref 150–400)
RBC: 3.56 MIL/uL — ABNORMAL LOW (ref 4.22–5.81)
RDW: 15.3 % (ref 11.5–15.5)
WBC: 9.3 10*3/uL (ref 4.0–10.5)

## 2013-07-07 MED ORDER — HEPARIN SODIUM (PORCINE) 5000 UNIT/ML IJ SOLN
5000.0000 [IU] | Freq: Three times a day (TID) | INTRAMUSCULAR | Status: DC
  Administered 2013-07-07 (×2): 5000 [IU] via SUBCUTANEOUS
  Filled 2013-07-07 (×4): qty 1

## 2013-07-07 MED ORDER — CHLORHEXIDINE GLUCONATE 0.12 % MT SOLN
15.0000 mL | Freq: Two times a day (BID) | OROMUCOSAL | Status: DC
  Administered 2013-07-07: 15 mL via OROMUCOSAL
  Filled 2013-07-07: qty 15

## 2013-07-07 MED ORDER — NYSTATIN 100000 UNIT/ML MT SUSP: 5.0000 mL | Freq: Four times a day (QID) | OROMUCOSAL | Status: DC

## 2013-07-07 MED ORDER — NA FERRIC GLUC CPLX IN SUCROSE 12.5 MG/ML IV SOLN
125.0000 mg | INTRAVENOUS | Status: DC
  Filled 2013-07-07: qty 10

## 2013-07-07 MED ORDER — SODIUM CHLORIDE 0.9 % IV SOLN
INTRAVENOUS | Status: DC
  Administered 2013-07-07: 02:00:00 via INTRAVENOUS

## 2013-07-07 MED ORDER — PANTOPRAZOLE SODIUM 40 MG IV SOLR
40.0000 mg | Freq: Every day | INTRAVENOUS | Status: DC
  Administered 2013-07-07: 40 mg via INTRAVENOUS
  Filled 2013-07-07 (×2): qty 40

## 2013-07-07 MED ORDER — WHITE PETROLATUM GEL: 1.0000 "application " | Status: DC | PRN

## 2013-07-07 MED ORDER — RENA-VITE PO TABS
1.0000 | ORAL_TABLET | Freq: Every day | ORAL | Status: DC
  Filled 2013-07-07: qty 1

## 2013-07-07 MED ORDER — LANTHANUM CARBONATE 500 MG PO CHEW
2000.0000 mg | CHEWABLE_TABLET | Freq: Three times a day (TID) | ORAL | Status: DC
  Filled 2013-07-07 (×2): qty 4

## 2013-07-07 MED ORDER — ONDANSETRON HCL 4 MG/2ML IJ SOLN: 4.0000 mg | Freq: Four times a day (QID) | INTRAMUSCULAR | Status: DC | PRN

## 2013-07-07 MED ORDER — PANTOPRAZOLE SODIUM 40 MG PO TBEC: 40.0000 mg | DELAYED_RELEASE_TABLET | Freq: Every day | ORAL | Status: DC

## 2013-07-07 MED ORDER — NYSTATIN 100000 UNIT/ML MT SUSP
5.0000 mL | Freq: Four times a day (QID) | OROMUCOSAL | Status: DC
  Filled 2013-07-07 (×2): qty 5

## 2013-07-07 MED ORDER — BIOTENE DRY MOUTH MT LIQD
15.0000 mL | Freq: Two times a day (BID) | OROMUCOSAL | Status: DC
Start: 2013-07-07 — End: 2013-07-07
  Administered 2013-07-07 (×2): 15 mL via OROMUCOSAL

## 2013-07-07 MED ORDER — MORPHINE SULFATE 2 MG/ML IJ SOLN
2.0000 mg | INTRAMUSCULAR | Status: DC | PRN
  Administered 2013-07-07: 2 mg via INTRAVENOUS
  Filled 2013-07-07: qty 1

## 2013-07-07 NOTE — ED Notes (Signed)
Attempted report 

## 2013-07-07 NOTE — Discharge Summary (Signed)
Patient ID: Randall [name] MRN: [id number] DOB/AGE: 48/04/1965 48 y.o.  Admit date: 07/06/2013 Discharge date: 07/07/2013  Procedures: none  Consults: nephrology  Reason for Admission: The pt is a renal failure pt who is on dialysis. He had a gastric band placed several years ago to help loose weight in preparation for a kidney transplant. His last fill was in oct 2014. He was apparently hospitalized with C diff about a month and a half ago when his PD catheter was removed. Since that time he says his stomach has not been right. He has had intermittent abdominal pain. He then developed some cellulitis of his left leg about 5 days ago. He showed his renal doctor this at dialysis. He was started on an oral antibiotic the name of which he does not know. Since that time he has been having some nausea and dry heaves. He had a CT 2 days ago with some small oral contrast that did not show obstruction at the band.  Admission Diagnoses:  1. Nausea due to abx Patient Active Problem List   Diagnosis Date Noted  . Nausea & vomiting 07/07/2013  . Anemia in chronic kidney disease(285.21) 07/01/2013  . HTN (hypertension) 07/01/2013  . Super obese 07/01/2013  . ESRD on dialysis 07/01/2013  . OSA (obstructive sleep apnea) 07/01/2013  . Polyneuropathy in diabetes(357.2) 07/01/2013  . Sepsis 05/26/2013  . Diarrhea 05/26/2013  . Hypotension 05/26/2013  . Renal failure 05/18/2013  . Elevated lactic acid level 02/21/2013  . Fitting and adjustment of gastric lap band 12/26/2012  . Abdominal pain 12/03/2012  . Unspecified constipation 09/06/2012  . Nausea and vomiting 04/30/2012  . Intractable nausea and vomiting 02/16/2012  . Vomiting 01/14/2012  . Hypertension 01/14/2012  . 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 ESRD (end stage renal disease) on dialysis 01/12/2011    Hospital Course: The patient was admitted for IV fluids and observation.  His nausea resolved quit quickly.  His diet was  advanced.  Since he was observation, he was unable to get HD in the hospital.  Therefore, he was discharged home for outpatient HD.  He did c/o oral thrush that he will get a Rx for.  Discharge Diagnoses:  Active Problems:   Nausea & vomiting oral candidiasis   Discharge Medications:   Medication List    STOP taking these medications       vancomycin 50 mg/mL oral solution  Commonly known as:  VANCOCIN      TAKE these medications       acetaminophen 325 MG tablet  Commonly known as:  TYLENOL  Take 650 mg by mouth every 6 (six) hours as needed for moderate pain.     lanthanum 1000 MG chewable tablet  Commonly known as:  FOSRENOL  Chew 2,000-3,000 mg by mouth See admin instructions. Takes 3 tablets with meals and 2 tablets with snacks     multivitamin Tabs tablet  Take 1 tablet by mouth at bedtime.     nystatin 100000 UNIT/ML suspension  Commonly known as:  MYCOSTATIN  Take 5 mLs (500,000 Units total) by mouth 4 (four) times daily.     pregabalin 50 MG capsule  Commonly known as:  LYRICA  Take 1 capsule (50 mg total) by mouth daily. Patient takes 2 at night since March 2015, Dr. Darrick Pennaeterding.     pregabalin 25 MG capsule  Commonly known as:  LYRICA  Take 1 capsule (25 mg total) by mouth 2 (two) times daily.  SENSIPAR 60 MG tablet  Generic drug:  cinacalcet  Take 60 mg by mouth at bedtime.     white petrolatum Gel  Commonly known as:  VASELINE  Apply 1 application topically as needed for dry skin (on feet).        Discharge Instructions:     Follow-up Information   Follow up with Luretha MurphyMARTIN,MATTHEW B, MD. Schedule an appointment as soon as possible for a visit in 2 weeks.   Specialty:  General Surgery   Contact information:   [address] [place] [address] [phone]       Signed: Letha CapeKelly E Sparsh Callens 07/07/2013, 3:09 PM

## 2013-07-07 NOTE — Progress Notes (Signed)
Reviewed discharge instructions with handout given. Reviewed medications with pt. Prescription given to pt. Pt reports he will be going to dialysis tomorrow. Pt didn't have any further questions. Pt will discharge when ride arrives. Claude Mangesara S Malu Pellegrini

## 2013-07-07 NOTE — Progress Notes (Signed)
Mr Randall [name] is currently admitted under "Observation" and has missed his opportunity for outpatient hemodialysis today while awaiting PO challenge with lunch. Patient was seen this morning, labs were reviewed and there is currently no need for urgent hemodialysis. Arrangements have been made for him to dialyze at his outpatient center tomorrow at [facility] Noon.    Please re-consult if he will be made an inpatient and/or kept overnight.  Claud KelpKaren Elonna Mcfarlane [facility] Pager [phone]

## 2013-07-07 NOTE — H&P (Signed)
Randall [name] is an 48 y.o. male.   Chief Complaint: The pt is a renal failure pt who is on dialysis. He had a gastric band placed several years ago to help loose weight in preparation for a kidney transplant. His last fill was in oct 2014. He was apparently hospitalized with C diff about a month and a half ago when his PD catheter was removed. Since that time he says his stomach has not been right. He has had intermittent abdominal pain. He then developed some cellulitis of his left leg about 5 days ago. He showed his renal doctor this at dialysis. He was started on an oral antibiotic the name of which he does not know. Since that time he has been having some nausea and dry heaves. He had a CT 2 days ago with some small oral contrast that did not show obstruction at the band. HPI: as above  Past Medical History  Diagnosis Date  . Hyperlipidemia   . Thyroid disorder     takes sensipar  . 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 Hyperthyroidism   . Hypertension     hx of off bp meds since 2012  . Sleep apnea     hx of sleep apnea prior to weight loss in 2012  . Diabetes mellitus     controlling by weight/diet  . ESRD on hemodialysis march 2012    [facility] on MWF, ESRD due to DM/HTN  . Peritoneal dialysis status     oct 2014 to dec 2014    Past Surgical History  Procedure Laterality Date  . Gastric restriction surgery  12/06/09    lap band  . Dialysis fistula creation  2011  . Insertion of dialysis catheter  11/13/2011    Procedure: INSERTION OF DIALYSIS CATHETER;  Surgeon: Elam Dutch, MD;  Location: [place];  Service: Vascular;  Laterality: Right;  Insertion of 23cm dialysis catheter in right IJ  . Av fistula placement  12/05/2011    Procedure: ARTERIOVENOUS (AV) FISTULA CREATION;  Surgeon: Rosetta Posner, MD;  Location: [place];  Service: Vascular;  Laterality: Left;  . Av fistula placement  01-23-2012    #1 ligation of 2 competing branches of left upper arm AVF   #2  superifical  mobilization of left upper arm AVF   . Capd removal N/A 05/18/2013    Procedure: LAPAROSCOPIC REMOVAL CONTINUOUS AMBULATORY PERITONEAL DIALYSIS  (CAPD) CATHETER;  Surgeon: Pedro Earls, MD;  Location: [facility] ORS;  Service: General;  Laterality: N/A;    Family History  Problem Relation Age of Onset  . Hypertension Mother   . Diabetes Mother   . Hypertension Father   . Diabetes Father    Social History:  reports that he has never smoked. He has never used smokeless tobacco. He reports that he does not drink alcohol or use illicit drugs.  Allergies:  Allergies  Allergen Reactions  . Iohexol Itching    Pt had severe itching to head,face,upper chest after 80 ml Omni 300 administered.No obvious hives. I spoke to Dr Eustace Quail, and he didn't feel the need for Benadryl to be given.  KR     (Not in a hospital admission)  Results for orders placed during the hospital encounter of 07/06/13 (from the past 48 hour(s))  BASIC METABOLIC PANEL     Status: Abnormal   Collection Time    07/06/13  1:29 PM      Result Value Ref Range  Sodium 140  137 - 147 mEq/L   Potassium 4.5  3.7 - 5.3 mEq/L   Comment: HEMOLYSIS AT THIS LEVEL MAY AFFECT RESULT   Chloride 92 (*) 96 - 112 mEq/L   CO2 23  19 - 32 mEq/L   Glucose, Bld 92  70 - 99 mg/dL   BUN 40 (*) 6 - 23 mg/dL   Comment: DELTA CHECK NOTED   Creatinine, Ser 11.66 (*) 0.50 - 1.35 mg/dL   Comment: DELTA CHECK NOTED   Calcium 7.8 (*) 8.4 - 10.5 mg/dL   GFR calc non Af Amer 4 (*) >90 mL/min   GFR calc Af Amer 5 (*) >90 mL/min   Comment: (NOTE)     The eGFR has been calculated using the CKD EPI equation.     This calculation has not been validated in all clinical situations.     eGFR's persistently <90 mL/min signify possible Chronic Kidney     Disease.  PRO B NATRIURETIC PEPTIDE     Status: Abnormal   Collection Time    07/06/13  1:29 PM      Result Value Ref Range   Pro B Natriuretic peptide (BNP) 2722.0 (*) 0 - 125 pg/mL  LIPASE, BLOOD      Status: None   Collection Time    07/06/13  1:29 PM      Result Value Ref Range   Lipase 24  11 - 59 U/L  I-STAT TROPOININ, ED     Status: None   Collection Time    07/06/13  1:40 PM      Result Value Ref Range   Troponin i, poc 0.01  0.00 - 0.08 ng/mL   Comment 3            Comment: Due to the release kinetics of cTnI,     a negative result within the first hours     of the onset of symptoms does not rule out     myocardial infarction with certainty.     If myocardial infarction is still suspected,     repeat the test at appropriate intervals.  CBC WITH DIFFERENTIAL     Status: Abnormal   Collection Time    07/06/13 11:31 PM      Result Value Ref Range   WBC 11.0 (*) 4.0 - 10.5 K/uL   RBC 4.00 (*) 4.22 - 5.81 MIL/uL   Hemoglobin 12.1 (*) 13.0 - 17.0 g/dL   HCT 37.5 (*) 39.0 - 52.0 %   MCV 93.8  78.0 - 100.0 fL   MCH 30.3  26.0 - 34.0 pg   MCHC 32.3  30.0 - 36.0 g/dL   RDW 15.2  11.5 - 15.5 %   Platelets 217  150 - 400 K/uL   Neutrophils Relative % 75  43 - 77 %   Neutro Abs 8.3 (*) 1.7 - 7.7 K/uL   Lymphocytes Relative 17  12 - 46 %   Lymphs Abs 1.8  0.7 - 4.0 K/uL   Monocytes Relative 8  3 - 12 %   Monocytes Absolute 0.8  0.1 - 1.0 K/uL   Eosinophils Relative 0  0 - 5 %   Eosinophils Absolute 0.0  0.0 - 0.7 K/uL   Basophils Relative 0  0 - 1 %   Basophils Absolute 0.0  0.0 - 0.1 K/uL   Dg Chest 2 View  07/06/2013   CLINICAL DATA:  Chest pain.  EXAM: CHEST  2 VIEW  COMPARISON:  Chest radiograph  performed 05/26/2013  FINDINGS: The lungs are well-aerated and clear. There is no evidence of focal opacification, pleural effusion or pneumothorax.  The heart is normal in size; the mediastinal contour is within normal limits. No acute osseous abnormalities are seen.  IMPRESSION: No acute cardiopulmonary process seen.   Electronically Signed   By: Garald Balding M.D.   On: 07/06/2013 21:26    Review of Systems  Constitutional: Positive for weight loss.  HENT: Negative.   Eyes:  Negative.   Respiratory: Negative.   Cardiovascular: Positive for leg swelling.  Gastrointestinal: Positive for nausea, vomiting and abdominal pain.  Genitourinary: Negative.   Musculoskeletal: Negative.   Skin: Negative.   Neurological: Negative.   Endo/Heme/Allergies: Negative.   Psychiatric/Behavioral: Negative.     Blood pressure 152/84, pulse 87, temperature 98.1 F (36.7 C), temperature source Oral, resp. rate 17, height _0  (1.778 m), weight 267 lb (121.11 kg), SpO2 99.00%. Physical Exam  Constitutional: He is oriented to person, place, and time.  Obese bm in nad  HENT:  Head: Normocephalic and atraumatic.  Eyes: Conjunctivae and EOM are normal. Pupils are equal, round, and reactive to light.  Neck: Normal range of motion. Neck supple.  Cardiovascular: Normal rate, regular rhythm and normal heart sounds.   Respiratory: Effort normal and breath sounds normal.  GI: Soft. Bowel sounds are normal.  There is minimal tenderness of central abdomen. No guarding or peritonitis  Musculoskeletal: Normal range of motion.  Neurological: He is alert and oriented to person, place, and time.  Skin: Skin is warm and dry.  Psychiatric: He has a normal mood and affect. His behavior is normal.     Assessment/Plan The pt is having some nausea which may be related to an antibiotic that was recently started. It is not clear that he is obstructed at the band site. Will plan to admit him, stop his abx since there is no more cellulitis of leg, and gently hydrate. Will consult renal in am for dialysis  Luella Cook III 07/07/2013, 12:31 AM

## 2013-07-07 NOTE — Progress Notes (Signed)
I have seen and examined the pt and agree with NP-Riebock's progress note. PO as tol Hopeful to be Dc'd after lunch

## 2013-07-07 NOTE — Progress Notes (Signed)
Haskel KhanJohn P Mccorvey [id number] Code Status: Full Admission Data: 07/07/2013 3:41 AM Attending Provider:  CCS WUJ:WJXBJYN,WGNFAOZHPCP:SHELTON,KIMBERLY R., MD  [name] is a 48 y.o. male patient admitted from ED awake, alert - oriented  X 3 - no acute distress noted.  VSS - Blood pressure 129/79, pulse 77, temperature 97.7 F (36.5 C), temperature source Oral, resp. rate 16, height 5\' 10"  (1.778 m), weight 121.11 kg (267 lb), SpO2 99.00%.    IV in place, occlusive dsg intact without redness. Patient states pain well controlled at 4/10.  Orientation to room, and floor completed, SR up x 2, fall assessment complete, with patient and family able to verbalize understanding of risk associated with falls.  Call light within reach, patient able to voice, and demonstrate understanding.  Skin, clean-dry- intact without evidence of bruising, or skin tears.    Will cont to eval and treat per MD orders.  Burt Ekaylor D Cook, RN 07/07/2013 3:41 AM

## 2013-07-07 NOTE — ED Notes (Signed)
MD at bedside. 

## 2013-07-07 NOTE — Progress Notes (Signed)
UR Completed.  Kito Cuffe Jane Aser Nylund [phone] 07/07/2013  

## 2013-07-07 NOTE — Progress Notes (Signed)
Patient ID: Randall [name], male   DOB: 10-06-1965, 48 y.o.   MRN: [id number]  Subjective: No n/v.  C/o lower abdominal pain started after several episodes of vomiting.   Objective:  Vital signs:  Filed Vitals:   07/07/13 0000 07/07/13 0030 07/07/13 0132 07/07/13 0539  BP: 118/72 108/70 129/79 107/63  Pulse: 87 80 77 74  Temp:   97.7 F (36.5 C) 97.5 F (36.4 C)  TempSrc:   Oral Oral  Resp: $Remo'17 12 16 17  'DidJj$ Height:      Weight:      SpO2: 95% 95% 99% 95%    Last BM Date: 07/04/13  Intake/Output   Yesterday:  04/27 0701 - 04/28 0700 In: 180.8 [I.V.:180.8] Out: -  This shift: I/O last 3 completed shifts: In: 180.8 [I.V.:180.8] Out: -     Physical Exam: General: Pt awake/alert/oriented x4 in no acute distress Chest: cta. No chest wall pain w good excursion CV:  Pulses intact.  Regular rhythm MS: Normal AROM mjr joints.  No obvious deformity Abdomen: Soft.  Nondistended.  Minimal ttp pelvic region.  No evidence of peritonitis.  No incarcerated hernias. Ext:  SCDs BLE.  No mjr edema.  No cyanosis Skin: No petechiae / purpura   Problem List:   Active Problems:   Nausea & vomiting    Results:   Labs: Results for orders placed during the hospital encounter of 07/06/13 (from the past 48 hour(s))  BASIC METABOLIC PANEL     Status: Abnormal   Collection Time    07/06/13  1:29 PM      Result Value Ref Range   Sodium 140  137 - 147 mEq/L   Potassium 4.5  3.7 - 5.3 mEq/L   Comment: HEMOLYSIS AT THIS LEVEL MAY AFFECT RESULT   Chloride 92 (*) 96 - 112 mEq/L   CO2 23  19 - 32 mEq/L   Glucose, Bld 92  70 - 99 mg/dL   BUN 40 (*) 6 - 23 mg/dL   Comment: DELTA CHECK NOTED   Creatinine, Ser 11.66 (*) 0.50 - 1.35 mg/dL   Comment: DELTA CHECK NOTED   Calcium 7.8 (*) 8.4 - 10.5 mg/dL   GFR calc non Af Amer 4 (*) >90 mL/min   GFR calc Af Amer 5 (*) >90 mL/min   Comment: (NOTE)     The eGFR has been calculated using the CKD EPI equation.     This calculation has not been  validated in all clinical situations.     eGFR's persistently <90 mL/min signify possible Chronic Kidney     Disease.  PRO B NATRIURETIC PEPTIDE     Status: Abnormal   Collection Time    07/06/13  1:29 PM      Result Value Ref Range   Pro B Natriuretic peptide (BNP) 2722.0 (*) 0 - 125 pg/mL  LIPASE, BLOOD     Status: None   Collection Time    07/06/13  1:29 PM      Result Value Ref Range   Lipase 24  11 - 59 U/L  I-STAT TROPOININ, ED     Status: None   Collection Time    07/06/13  1:40 PM      Result Value Ref Range   Troponin i, poc 0.01  0.00 - 0.08 ng/mL   Comment 3            Comment: Due to the release kinetics of cTnI,     a negative result within the  first hours     of the onset of symptoms does not rule out     myocardial infarction with certainty.     If myocardial infarction is still suspected,     repeat the test at appropriate intervals.  CBC WITH DIFFERENTIAL     Status: Abnormal   Collection Time    07/06/13 11:31 PM      Result Value Ref Range   WBC 11.0 (*) 4.0 - 10.5 K/uL   RBC 4.00 (*) 4.22 - 5.81 MIL/uL   Hemoglobin 12.1 (*) 13.0 - 17.0 g/dL   HCT 37.5 (*) 39.0 - 52.0 %   MCV 93.8  78.0 - 100.0 fL   MCH 30.3  26.0 - 34.0 pg   MCHC 32.3  30.0 - 36.0 g/dL   RDW 15.2  11.5 - 15.5 %   Platelets 217  150 - 400 K/uL   Neutrophils Relative % 75  43 - 77 %   Neutro Abs 8.3 (*) 1.7 - 7.7 K/uL   Lymphocytes Relative 17  12 - 46 %   Lymphs Abs 1.8  0.7 - 4.0 K/uL   Monocytes Relative 8  3 - 12 %   Monocytes Absolute 0.8  0.1 - 1.0 K/uL   Eosinophils Relative 0  0 - 5 %   Eosinophils Absolute 0.0  0.0 - 0.7 K/uL   Basophils Relative 0  0 - 1 %   Basophils Absolute 0.0  0.0 - 0.1 K/uL  BASIC METABOLIC PANEL     Status: Abnormal   Collection Time    07/07/13  5:21 AM      Result Value Ref Range   Sodium 144  137 - 147 mEq/L   Potassium 4.2  3.7 - 5.3 mEq/L   Chloride 98  96 - 112 mEq/L   CO2 24  19 - 32 mEq/L   Glucose, Bld 77  70 - 99 mg/dL   BUN 48 (*)  6 - 23 mg/dL   Creatinine, Ser 13.42 (*) 0.50 - 1.35 mg/dL   Calcium 7.2 (*) 8.4 - 10.5 mg/dL   GFR calc non Af Amer 4 (*) >90 mL/min   GFR calc Af Amer 4 (*) >90 mL/min   Comment: (NOTE)     The eGFR has been calculated using the CKD EPI equation.     This calculation has not been validated in all clinical situations.     eGFR's persistently <90 mL/min signify possible Chronic Kidney     Disease.  CBC     Status: Abnormal   Collection Time    07/07/13  5:21 AM      Result Value Ref Range   WBC 9.3  4.0 - 10.5 K/uL   RBC 3.56 (*) 4.22 - 5.81 MIL/uL   Hemoglobin 10.6 (*) 13.0 - 17.0 g/dL   HCT 33.5 (*) 39.0 - 52.0 %   MCV 94.1  78.0 - 100.0 fL   MCH 29.8  26.0 - 34.0 pg   MCHC 31.6  30.0 - 36.0 g/dL   RDW 15.3  11.5 - 15.5 %   Platelets 212  150 - 400 K/uL    Imaging / Studies: Dg Chest 2 View  07/06/2013   CLINICAL DATA:  Chest pain.  EXAM: CHEST  2 VIEW  COMPARISON:  Chest radiograph performed 05/26/2013  FINDINGS: The lungs are well-aerated and clear. There is no evidence of focal opacification, pleural effusion or pneumothorax.  The heart is normal in size; the mediastinal contour is  within normal limits. No acute osseous abnormalities are seen.  IMPRESSION: No acute cardiopulmonary process seen.   Electronically Signed   By: Garald Balding M.D.   On: 07/06/2013 21:26    Medications / Allergies: per chart  Antibiotics: Anti-infectives   None      Assessment/Plan ESRD/hemodialysis DM type II S/p lapband 2011 Nausea and vomiting Likely due to antibiotics.  Abdominal pain is likely musculoskeletal.  Advance diet.  He is unable to be dialyzed here because he is not inpatient nor does he meet to be inpatient.  If able to tolerate lunch will discharge home.   Erby Pian, [facility] Pager [phone] Office [phone]  07/07/2013 9:40 AM

## 2013-09-08 ENCOUNTER — Encounter: Payer: Self-pay | Admitting: Family

## 2013-09-09 ENCOUNTER — Ambulatory Visit: Payer: Medicare Other | Admitting: Family

## 2013-09-10 ENCOUNTER — Ambulatory Visit: Payer: Medicare Other | Admitting: Vascular Surgery

## 2013-09-22 ENCOUNTER — Encounter (INDEPENDENT_AMBULATORY_CARE_PROVIDER_SITE_OTHER): Payer: 59 | Admitting: Surgery

## 2013-09-22 ENCOUNTER — Telehealth: Payer: Self-pay | Admitting: Vascular Surgery

## 2013-09-22 NOTE — Telephone Encounter (Signed)
Message copied by Fredrich BirksMILLIKAN, DANA P on Tue Sep 22, 2013  3:25 PM ------      Message from: Phillips OdorPULLINS, CAROL S      Created: Tue Sep 22, 2013  9:38 AM      Regarding: needs appt. rescheduled       Melissa from [facility]. Called; this pt. No showed his appt. 7/2; needs to be rescheduled for evaluation of hematoma @ old AVF site. The pt. dialyzes T-Th-Sat. Can you call Melissa @ [phone] with an appt.   ------

## 2013-09-24 ENCOUNTER — Encounter (INDEPENDENT_AMBULATORY_CARE_PROVIDER_SITE_OTHER): Payer: 59

## 2013-09-25 ENCOUNTER — Encounter: Payer: Self-pay | Admitting: Vascular Surgery

## 2013-09-28 ENCOUNTER — Ambulatory Visit: Payer: Medicare Other | Admitting: Vascular Surgery

## 2013-09-29 ENCOUNTER — Encounter: Payer: Self-pay | Admitting: Vascular Surgery

## 2013-09-29 ENCOUNTER — Ambulatory Visit (INDEPENDENT_AMBULATORY_CARE_PROVIDER_SITE_OTHER): Payer: Medicare Other | Admitting: Vascular Surgery

## 2013-09-29 VITALS — BP 111/65 | HR 105 | Wt 267.8 lb

## 2013-09-29 DIAGNOSIS — N186 End stage renal disease: Secondary | ICD-10-CM

## 2013-09-29 NOTE — Progress Notes (Signed)
The patient is a 48-year-old male referred by Dr. Deterding for evaluation of a painful left radiocephalic AV fistula. The patient is currently using a left brachiocephalic fistula. The left radial cephalic fistula has been nonfunctional for quite some time. He began to develop pain over the fistula the last few weeks. There is some swelling in the area but this is chronic. He has no numbness or tingling in the hand.  Review of systems: He denies fever or chills. He denies drainage from the fistula. He has no shortness of breath. He has no chest pain.  Physical exam:  Filed Vitals:   09/29/13 1620  BP: 111/65  Pulse: 105  Weight: 267 lb 12.8 oz (121.473 kg)  SpO2: 98%    Left upper extremity: Pulsatile left radiocephalic fistula which is visible over 4 cm no audible bruit Patent left upper arm AV fistula with palpable thrill  Assessment: Occluded left radiocephalic fistula which is painful on palpation potentially secondary to thrombus no evidence of infection  Plan: Ligation and excision of left radiocephalic fistula Monday, July 27. Risks benefits possible complications and procedure details were explained to the patient today. These included but were not limited to bleeding and infection. He understands and agrees to proceed.  Charles Fields, MD [facility] [place] Office: [phone] Pager: [phone]  

## 2013-09-30 ENCOUNTER — Encounter (HOSPITAL_COMMUNITY): Payer: Self-pay | Admitting: Pharmacy Technician

## 2013-09-30 ENCOUNTER — Telehealth: Payer: Self-pay

## 2013-09-30 ENCOUNTER — Other Ambulatory Visit: Payer: Self-pay

## 2013-10-02 ENCOUNTER — Encounter: Payer: Self-pay | Admitting: *Deleted

## 2013-10-02 ENCOUNTER — Encounter (HOSPITAL_COMMUNITY): Payer: Self-pay | Admitting: *Deleted

## 2013-10-02 NOTE — Progress Notes (Signed)
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 Patient is currently scheduled to come in on Monday for ligation and possible removal of his left radiocephalic AVF which is not being used. I have paged Dr. Darrick PennaFields to get approval to change surgery posting for Monday.

## 2013-10-02 NOTE — Progress Notes (Addendum)
I spoke to Dr. Imogene Burnhen about this and he suggested that I tell the patient to talk this over with Dr. Darrick PennaFields prior to surgery on Monday. If needed, the consent can be changed to reflect a possible revision of the Left brachiocephalic AVF if Dr. Darrick PennaFields' deems necessary.

## 2013-10-04 MED ORDER — DEXTROSE 5 % IV SOLN
1.5000 g | INTRAVENOUS | Status: DC
  Filled 2013-10-04: qty 1.5

## 2013-10-05 ENCOUNTER — Encounter (HOSPITAL_COMMUNITY)
Admission: RE | Disposition: A | Discharge status: Home / Self Care | Payer: Self-pay | Source: Ambulatory Visit | Attending: Vascular Surgery

## 2013-10-05 ENCOUNTER — Ambulatory Visit (HOSPITAL_COMMUNITY): Payer: Medicare Other | Admitting: Anesthesiology

## 2013-10-05 ENCOUNTER — Ambulatory Visit (HOSPITAL_COMMUNITY): Payer: Medicare Other

## 2013-10-05 ENCOUNTER — Encounter (HOSPITAL_COMMUNITY): Payer: Self-pay | Admitting: *Deleted

## 2013-10-05 ENCOUNTER — Ambulatory Visit (HOSPITAL_COMMUNITY)
Admission: RE | Admit: 2013-10-05 | Discharge: 2013-10-05 | Disposition: A | Discharge status: Home / Self Care | Payer: Medicare Other | Source: Ambulatory Visit | Attending: Vascular Surgery | Admitting: Vascular Surgery

## 2013-10-05 ENCOUNTER — Other Ambulatory Visit: Payer: Self-pay

## 2013-10-05 ENCOUNTER — Encounter (HOSPITAL_COMMUNITY): Payer: Medicare Other | Admitting: Anesthesiology

## 2013-10-05 DIAGNOSIS — G4730 Sleep apnea, unspecified: Secondary | ICD-10-CM | POA: Diagnosis not present

## 2013-10-05 DIAGNOSIS — Z48812 Encounter for surgical aftercare following surgery on the circulatory system: Secondary | ICD-10-CM

## 2013-10-05 DIAGNOSIS — Z992 Dependence on renal dialysis: Secondary | ICD-10-CM | POA: Diagnosis not present

## 2013-10-05 DIAGNOSIS — Y832 Surgical operation with anastomosis, bypass or graft as the cause of abnormal reaction of the patient, or of later complication, without mention of misadventure at the time of the procedure: Secondary | ICD-10-CM | POA: Insufficient documentation

## 2013-10-05 DIAGNOSIS — N186 End stage renal disease: Secondary | ICD-10-CM

## 2013-10-05 DIAGNOSIS — T82898A Other specified complication of vascular prosthetic devices, implants and grafts, initial encounter: Secondary | ICD-10-CM | POA: Insufficient documentation

## 2013-10-05 DIAGNOSIS — E119 Type 2 diabetes mellitus without complications: Secondary | ICD-10-CM | POA: Diagnosis not present

## 2013-10-05 DIAGNOSIS — I120 Hypertensive chronic kidney disease with stage 5 chronic kidney disease or end stage renal disease: Secondary | ICD-10-CM | POA: Diagnosis not present

## 2013-10-05 DIAGNOSIS — E0590 Thyrotoxicosis, unspecified without thyrotoxic crisis or storm: Secondary | ICD-10-CM | POA: Insufficient documentation

## 2013-10-05 DIAGNOSIS — N185 Chronic kidney disease, stage 5: Secondary | ICD-10-CM

## 2013-10-05 HISTORY — PX: INSERTION OF DIALYSIS CATHETER: SHX1324

## 2013-10-05 HISTORY — DX: Endocrine disorder, unspecified: G63

## 2013-10-05 HISTORY — PX: LIGATION OF ARTERIOVENOUS  FISTULA: SHX5948

## 2013-10-05 HISTORY — DX: Endocrine disorder, unspecified: E34.9

## 2013-10-05 LAB — POCT I-STAT 4, (NA,K, GLUC, HGB,HCT)
Glucose, Bld: 97 mg/dL (ref 70–99)
HCT: 46 % (ref 39.0–52.0)
Hemoglobin: 15.6 g/dL (ref 13.0–17.0)
Potassium: 4.3 mEq/L (ref 3.7–5.3)
Sodium: 138 mEq/L (ref 137–147)

## 2013-10-05 LAB — GLUCOSE, CAPILLARY
Glucose-Capillary: 106 mg/dL — ABNORMAL HIGH (ref 70–99)
Glucose-Capillary: 112 mg/dL — ABNORMAL HIGH (ref 70–99)

## 2013-10-05 SURGERY — INSERTION OF DIALYSIS CATHETER
Anesthesia: Monitor Anesthesia Care | Site: Chest | Laterality: Right

## 2013-10-05 MED ORDER — ONDANSETRON HCL 4 MG/2ML IJ SOLN: 4.0000 mg | Freq: Once | INTRAMUSCULAR | Status: DC | PRN

## 2013-10-05 MED ORDER — 0.9 % SODIUM CHLORIDE (POUR BTL) OPTIME
TOPICAL | Status: DC | PRN
  Administered 2013-10-05: 1000 mL

## 2013-10-05 MED ORDER — HEPARIN SODIUM (PORCINE) 1000 UNIT/ML IJ SOLN
INTRAMUSCULAR | Status: DC | PRN
  Administered 2013-10-05: 8000 [IU] via INTRAVENOUS

## 2013-10-05 MED ORDER — SODIUM CHLORIDE 0.9 % IV SOLN: Freq: Once | INTRAVENOUS | Status: DC

## 2013-10-05 MED ORDER — PROTAMINE SULFATE 10 MG/ML IV SOLN
INTRAVENOUS | Status: AC
  Filled 2013-10-05: qty 5

## 2013-10-05 MED ORDER — FENTANYL CITRATE 0.05 MG/ML IJ SOLN
INTRAMUSCULAR | Status: AC
  Filled 2013-10-05: qty 5

## 2013-10-05 MED ORDER — HEPARIN SODIUM (PORCINE) 1000 UNIT/ML IJ SOLN
INTRAMUSCULAR | Status: DC | PRN
  Administered 2013-10-05: 4.6 mL

## 2013-10-05 MED ORDER — DEXTROSE 5 % IV SOLN
1.5000 g | INTRAVENOUS | Status: DC | PRN
  Administered 2013-10-05: 1.5 g via INTRAVENOUS

## 2013-10-05 MED ORDER — PROPOFOL 10 MG/ML IV BOLUS
INTRAVENOUS | Status: AC
  Filled 2013-10-05: qty 20

## 2013-10-05 MED ORDER — FENTANYL CITRATE 0.05 MG/ML IJ SOLN
INTRAMUSCULAR | Status: DC | PRN
  Administered 2013-10-05: 25 ug via INTRAVENOUS
  Administered 2013-10-05 (×2): 50 ug via INTRAVENOUS
  Administered 2013-10-05 (×3): 25 ug via INTRAVENOUS

## 2013-10-05 MED ORDER — LACTATED RINGERS IV SOLN: INTRAVENOUS | Status: DC

## 2013-10-05 MED ORDER — HEPARIN SODIUM (PORCINE) 1000 UNIT/ML IJ SOLN
INTRAMUSCULAR | Status: AC
  Filled 2013-10-05: qty 1

## 2013-10-05 MED ORDER — CHLORHEXIDINE GLUCONATE CLOTH 2 % EX PADS: 6.0000 | MEDICATED_PAD | Freq: Once | CUTANEOUS | Status: DC

## 2013-10-05 MED ORDER — SODIUM CHLORIDE 0.9 % IR SOLN
Status: DC | PRN
  Administered 2013-10-05: 11:00:00

## 2013-10-05 MED ORDER — ONDANSETRON HCL 4 MG/2ML IJ SOLN
INTRAMUSCULAR | Status: DC | PRN
  Administered 2013-10-05: 4 mg via INTRAVENOUS

## 2013-10-05 MED ORDER — LIDOCAINE HCL (CARDIAC) 20 MG/ML IV SOLN
INTRAVENOUS | Status: DC | PRN
  Administered 2013-10-05: 100 mg via INTRAVENOUS

## 2013-10-05 MED ORDER — SODIUM CHLORIDE 0.9 % IJ SOLN
INTRAMUSCULAR | Status: AC
  Filled 2013-10-05: qty 10

## 2013-10-05 MED ORDER — PROTAMINE SULFATE 10 MG/ML IV SOLN
INTRAVENOUS | Status: DC | PRN
  Administered 2013-10-05: 20 mg via INTRAVENOUS
  Administered 2013-10-05 (×2): 10 mg via INTRAVENOUS
  Administered 2013-10-05: 20 mg via INTRAVENOUS
  Administered 2013-10-05 (×2): 10 mg via INTRAVENOUS

## 2013-10-05 MED ORDER — EPHEDRINE SULFATE 50 MG/ML IJ SOLN
INTRAMUSCULAR | Status: AC
  Filled 2013-10-05: qty 1

## 2013-10-05 MED ORDER — HYDROMORPHONE HCL PF 1 MG/ML IJ SOLN: 0.2500 mg | INTRAMUSCULAR | Status: DC | PRN

## 2013-10-05 MED ORDER — PHENYLEPHRINE HCL 10 MG/ML IJ SOLN
INTRAMUSCULAR | Status: DC | PRN
  Administered 2013-10-05 (×3): 120 ug via INTRAVENOUS
  Administered 2013-10-05: 40 ug via INTRAVENOUS

## 2013-10-05 MED ORDER — OXYCODONE HCL 5 MG PO TABS: 5.0000 mg | ORAL_TABLET | Freq: Four times a day (QID) | ORAL | Status: DC | PRN

## 2013-10-05 MED ORDER — SODIUM CHLORIDE 0.9 % IV SOLN
INTRAVENOUS | Status: DC
  Administered 2013-10-05: 09:00:00 via INTRAVENOUS

## 2013-10-05 MED ORDER — OXYCODONE HCL 5 MG PO TABS
ORAL_TABLET | ORAL | Status: AC
  Filled 2013-10-05: qty 1

## 2013-10-05 MED ORDER — PHENYLEPHRINE HCL 10 MG/ML IJ SOLN
10.0000 mg | INTRAMUSCULAR | Status: DC | PRN
  Administered 2013-10-05: 50 ug/min via INTRAVENOUS

## 2013-10-05 MED ORDER — EPHEDRINE SULFATE 50 MG/ML IJ SOLN
INTRAMUSCULAR | Status: DC | PRN
  Administered 2013-10-05: 10 mg via INTRAVENOUS
  Administered 2013-10-05 (×2): 15 mg via INTRAVENOUS

## 2013-10-05 MED ORDER — PROPOFOL 10 MG/ML IV BOLUS
INTRAVENOUS | Status: DC | PRN
  Administered 2013-10-05: 200 mg via INTRAVENOUS

## 2013-10-05 MED ORDER — ARTIFICIAL TEARS OP OINT
TOPICAL_OINTMENT | OPHTHALMIC | Status: DC | PRN
  Administered 2013-10-05: 1 via OPHTHALMIC

## 2013-10-05 MED ORDER — OXYCODONE HCL 5 MG PO TABS
5.0000 mg | ORAL_TABLET | Freq: Once | ORAL | Status: AC
  Administered 2013-10-05: 5 mg via ORAL

## 2013-10-05 SURGICAL SUPPLY — 53 items
BAG BANDED W/RUBBER/TAPE 36X54 (MISCELLANEOUS) ×3 IMPLANT
BLADE 10 SAFETY STRL DISP (BLADE) IMPLANT
CANISTER SUCTION 2500CC (MISCELLANEOUS) ×3 IMPLANT
CATH CANNON HEMO 15FR 23CM (HEMODIALYSIS SUPPLIES) ×3 IMPLANT
CLIP TI MEDIUM 6 (CLIP) ×3 IMPLANT
CLIP TI WIDE RED SMALL 6 (CLIP) ×3 IMPLANT
COVER DOME SNAP 22 D (MISCELLANEOUS) ×3 IMPLANT
COVER PROBE W GEL 5X96 (DRAPES) ×3 IMPLANT
COVER SURGICAL LIGHT HANDLE (MISCELLANEOUS) ×3 IMPLANT
DERMABOND ADVANCED (GAUZE/BANDAGES/DRESSINGS) ×3
DERMABOND ADVANCED .7 DNX12 (GAUZE/BANDAGES/DRESSINGS) ×6 IMPLANT
DRAPE CHEST BREAST 15X10 FENES (DRAPES) ×3 IMPLANT
ELECT REM PT RETURN 9FT ADLT (ELECTROSURGICAL) ×3
ELECTRODE REM PT RTRN 9FT ADLT (ELECTROSURGICAL) ×2 IMPLANT
GAUZE SPONGE 2X2 8PLY STRL LF (GAUZE/BANDAGES/DRESSINGS) ×2 IMPLANT
GAUZE SPONGE 4X4 16PLY XRAY LF (GAUZE/BANDAGES/DRESSINGS) ×3 IMPLANT
GEL ULTRASOUND 20GR AQUASONIC (MISCELLANEOUS) ×3 IMPLANT
GLOVE BIO SURGEON STRL SZ 6.5 (GLOVE) ×15 IMPLANT
GLOVE BIO SURGEON STRL SZ7.5 (GLOVE) ×6 IMPLANT
GLOVE BIOGEL PI IND STRL 6.5 (GLOVE) ×6 IMPLANT
GLOVE BIOGEL PI IND STRL 7.0 (GLOVE) ×2 IMPLANT
GLOVE BIOGEL PI IND STRL 8 (GLOVE) ×2 IMPLANT
GLOVE BIOGEL PI INDICATOR 6.5 (GLOVE) ×3
GLOVE BIOGEL PI INDICATOR 7.0 (GLOVE) ×1
GLOVE BIOGEL PI INDICATOR 8 (GLOVE) ×1
GOWN STRL REUS W/ TWL LRG LVL3 (GOWN DISPOSABLE) ×12 IMPLANT
GOWN STRL REUS W/ TWL XL LVL3 (GOWN DISPOSABLE) ×2 IMPLANT
GOWN STRL REUS W/TWL LRG LVL3 (GOWN DISPOSABLE) ×6
GOWN STRL REUS W/TWL XL LVL3 (GOWN DISPOSABLE) ×1
KIT BASIN OR (CUSTOM PROCEDURE TRAY) ×3 IMPLANT
KIT ROOM TURNOVER OR (KITS) ×3 IMPLANT
LOOP VESSEL MINI RED (MISCELLANEOUS) IMPLANT
NEEDLE 18GX1X1/2 (RX/OR ONLY) (NEEDLE) ×3 IMPLANT
NS IRRIG 1000ML POUR BTL (IV SOLUTION) ×3 IMPLANT
PACK CV ACCESS (CUSTOM PROCEDURE TRAY) ×3 IMPLANT
PAD ARMBOARD 7.5X6 YLW CONV (MISCELLANEOUS) ×6 IMPLANT
PROBE PENCIL 8 MHZ STRL DISP (MISCELLANEOUS) ×3 IMPLANT
SPONGE GAUZE 2X2 STER 10/PKG (GAUZE/BANDAGES/DRESSINGS) ×1
SPONGE SURGIFOAM ABS GEL 100 (HEMOSTASIS) IMPLANT
SUT ETHILON 3 0 PS 1 (SUTURE) ×3 IMPLANT
SUT PROLENE 5 0 C 1 24 (SUTURE) ×18 IMPLANT
SUT PROLENE 6 0 CC (SUTURE) IMPLANT
SUT SILK 0 (SUTURE) IMPLANT
SUT VIC AB 3-0 SH 27 (SUTURE) ×2
SUT VIC AB 3-0 SH 27X BRD (SUTURE) ×4 IMPLANT
SUT VICRYL 4-0 PS2 18IN ABS (SUTURE) ×9 IMPLANT
SYRINGE 12CC LL (MISCELLANEOUS) ×3 IMPLANT
SYRINGE 20CC LL (MISCELLANEOUS) ×3 IMPLANT
TAPE CLOTH SURG 4X10 WHT LF (GAUZE/BANDAGES/DRESSINGS) ×3 IMPLANT
TOWEL OR 17X24 6PK STRL BLUE (TOWEL DISPOSABLE) ×3 IMPLANT
TOWEL OR 17X26 10 PK STRL BLUE (TOWEL DISPOSABLE) ×3 IMPLANT
UNDERPAD 30X30 INCONTINENT (UNDERPADS AND DIAPERS) ×3 IMPLANT
WATER STERILE IRR 1000ML POUR (IV SOLUTION) ×3 IMPLANT

## 2013-10-05 NOTE — Interval H&P Note (Signed)
History and Physical Interval Note:  10/05/2013 10:26 AM  Randall KhanJohn P Cooprider  has presented today for surgery, with the diagnosis of End stage renal disease   The various methods of treatment have been discussed with the patient and family. After consideration of risks, benefits and other options for treatment, the patient has consented to  Procedure(s): LIGATION/EXCISION OF RADIOCEPHALIC ARTERIOVENOUS  FISTULA (Left) as a surgical intervention .  The patient's history has been reviewed, patient examined, no change in status, stable for surgery.  I have reviewed the patient's chart and labs.  Questions were answered to the patient's satisfaction.     Pt also complains of aneurysmal degeneration of left upper arm AV fistula.  We will plicate this as well and place a diatek catheter.  All of these procedures discussed with pt who agrees to proceed.  FIELDS,CHARLES E

## 2013-10-05 NOTE — Anesthesia Postprocedure Evaluation (Signed)
  Anesthesia Post-op Note  Patient: Randall KhanJohn P [name]  Procedure(s) Performed: Procedure(s): LEFT ARM LIGATION and EXCISION OF RADIOCEPHALIC ARTERIOVENOUS  FISTULA, PLICATION OF LEFT UPPER ARM FISTULA (Left) INSERTION OF DIALYSIS CATHETER IN RIGHT INTERNAL JUGULAR (Right)  Patient Location: PACU  Anesthesia Type:General  Level of Consciousness: awake, alert , oriented and patient cooperative  Airway and Oxygen Therapy: Patient Spontanous Breathing  Post-op Pain: mild  Post-op Assessment: Post-op Vital signs reviewed, Patient's Cardiovascular Status Stable, Respiratory Function Stable, Patent Airway, No signs of Nausea or vomiting and Pain level controlled  Post-op Vital Signs: stable  Last Vitals:  Filed Vitals:   10/05/13 1513  BP: 140/80  Pulse: 98  Temp: 36.5 C  Resp: 15    Complications: No apparent anesthesia complications

## 2013-10-05 NOTE — Op Note (Signed)
Procedure: Ultrasound-guided insertion of Diatek catheter, Plication left upper arm AV fistula, Excision/ligation left radial cephalic AV fistula  Preoperative diagnosis: End-stage renal disease, aneurysmal degeneration AV fistula  Postoperative diagnosis: Same  Anesthesia: General  Asst: Samantha Rhyne PA-C, Emerson Monte RNFA  Operative findings: 23 cm Diatek catheter right internal jugular vein  Operative details: After obtaining informed consent, the patient was taken to the operating room. The patient was placed in supine position on the operating room table. After adequate sedation the patient's entire neck and chest were prepped and draped in usual sterile fashion. The patient was placed in Trendelenburg position. Ultrasound was used to identify the patient's right internal jugular vein. This had normal compressibility and respiratory variation. Local anesthesia was infiltrated over the right jugular vein.  Using ultrasound guidance, the right internal jugular vein was successfully cannulated.  A 0.035 J-tipped guidewire was threaded into the right internal jugular vein and into the superior vena cava followed by the inferior vena cava under fluoroscopic guidance.   Next sequential 12 and 14 dilators were placed over the guidewire into the right atrium.  A 16 French dilator with a peel-away sheath was then placed over the guidewire into the right atrium.   The guidewire and dilator were removed. A 23 cm Diatek catheter was then placed through the peel away sheath into the right atrium.  The catheter was then tunneled subcutaneously, cut to length, and the hub attached. The catheter was noted to flush and draw easily. The catheter was inspected under fluoroscopy and found with its tip to be in the right atrium without any kinks throughout its course. The catheter was sutured to the skin with nylon sutures. The neck insertion site was closed with Vicryl stitch. The catheter was then loaded with  concentrated Heparin solution. A dry sterile dressing was applied.  Next attention was turned to the left upper extremity. The entire left upper extremity was prepped and draped in usual sterile fashion. A longitudinal incision was made over the proximal third of the fistula incorporating the area of aneurysmal degeneration. The fistula was dissected free circumferentially throughout its course. The more proximal aneurysm multilobulated and very thin walled.The patient was given 8000 units of intravenous heparin. The fistula was clamped proximally and distally above and below the aneurysm. The aneurysm was opened longitudinally and the redundant segment of fistula excised. The two ends were then reattached by first running a 5 0 prolene to reconstruct the posterior wall of the fistula. The anterior was of the 2 ends was then reapproximated using a running 5-0 Prolene suture. The clamps were removed and flow restored to the fistula. Hemostasis was obtained with administration of 80 mg of protamine and one repair stitch at the proximal anastomosis. The subcutaneous tissues were reapproximated using a running 3-0 Vicryl suture. The skin was closed with a 4 0 Vicryl subcuticular stitch. Dermabond was applied to the skin incision.  Next a longitudinal  incision was made over the fistula in the left wrist region. The incision was carried into the subcutaneous tissues down to level of a pre-existing left radialcephalic AV fistula. The fistula was dissected free circumferentially all the way down to the level of the radial artery anastomosis. The radial artery was dissected free circumferentially proximal and distal to the fistula. The radial artery was then controlled proximally and distally with fistula clamps. The fistula itself was controlled with a hemostat. The fistula was divided adjacent to the anastomosis. The cuff of vein was then oversewn to  close the artery. At completion this was forebled backbled and  thoroughly flushed. Clamps were released and there was a palpable radial pulse immediately.  After the vein had been removed, hemostasis was obtained.  The subcutaneous tissues were then reapproximated using a running 3-0 Vicryl suture. The skin was closed with a 4 0 Vicryl subcuticular stitch. Dermabond was applied all incisions. The patient tolerated procedure well there were no complications.  The patient tolerated the procedure well and there were no complications. Instrument sponge and needle counts were correct at the end of the case. The patient was taken to the recovery room in stable condition. Chest x-ray will be obtained in the recovery room.  Fabienne Brunsharles Fields, MD [facility] [place] Office: [phone] Pager: [phone]

## 2013-10-05 NOTE — H&P (View-Only) (Signed)
The patient is a 48 year old male referred by Dr. Darrick Pennaeterding for evaluation of a painful left radiocephalic AV fistula. The patient is currently using a left brachiocephalic fistula. The left radial cephalic fistula has been nonfunctional for quite some time. He began to develop pain over the fistula the last few weeks. There is some swelling in the area but this is chronic. He has no numbness or tingling in the hand.  Review of systems: He denies fever or chills. He denies drainage from the fistula. He has no shortness of breath. He has no chest pain.  Physical exam:  Filed Vitals:   09/29/13 1620  BP: 111/65  Pulse: 105  Weight: 267 lb 12.8 oz (121.473 kg)  SpO2: 98%    Left upper extremity: Pulsatile left radiocephalic fistula which is visible over 4 cm no audible bruit Patent left upper arm AV fistula with palpable thrill  Assessment: Occluded left radiocephalic fistula which is painful on palpation potentially secondary to thrombus no evidence of infection  Plan: Ligation and excision of left radiocephalic fistula Monday, July 27. Risks benefits possible complications and procedure details were explained to the patient today. These included but were not limited to bleeding and infection. He understands and agrees to proceed.  Fabienne Brunsharles Deyton Ellenbecker, MD [facility] [place] Office: [phone] Pager: [phone]

## 2013-10-05 NOTE — Anesthesia Procedure Notes (Signed)
Procedure Name: LMA Insertion Date/Time: 10/05/2013 10:52 AM Performed by: Delia ChimesVOSH, Brittiny Levitz E Pre-anesthesia Checklist: Patient identified, Emergency Drugs available, Suction available, Patient being monitored and Timeout performed Patient Re-evaluated:Patient Re-evaluated prior to inductionOxygen Delivery Method: Circle system utilized Preoxygenation: Pre-oxygenation with 100% oxygen Intubation Type: IV induction LMA: LMA inserted LMA Size: 5.0 Number of attempts: 1 Tube secured with: Tape Dental Injury: Teeth and Oropharynx as per pre-operative assessment

## 2013-10-05 NOTE — Discharge Instructions (Signed)
° ° °  10/05/2013 Randall KhanJohn P [name] [id number] 12/27/1965  Surgeon(s): Sherren Kernsharles E Fields, MD  Procedure(s): LIGATION/EXCISION OF RADIOCEPHALIC ARTERIOVENOUS  FISTULA, PLICATION OF LEFT UPPER ARM FISTULA INSERTION OF DIALYSIS CATHETER  x Do not stick graft for 6 weeks   What to eat:  For your first meals, you should eat lightly; only small meals initially.  If you do not have nausea, you may eat larger meals.  Avoid spicy, greasy and heavy food.    General Anesthesia, Adult, Care After  Refer to this sheet in the next few weeks. These instructions provide you with information on caring for yourself after your procedure. Your health care provider may also give you more specific instructions. Your treatment has been planned according to current medical practices, but problems sometimes occur. Call your health care provider if you have any problems or questions after your procedure.  WHAT TO EXPECT AFTER THE PROCEDURE  After the procedure, it is typical to experience:  Sleepiness.  Nausea and vomiting. HOME CARE INSTRUCTIONS  For the first 24 hours after general anesthesia:  Have a responsible person with you.  Do not drive a car. If you are alone, do not take public transportation.  Do not drink alcohol.  Do not take medicine that has not been prescribed by your health care provider.  Do not sign important papers or make important decisions.  You may resume a normal diet and activities as directed by your health care provider.  Change bandages (dressings) as directed.  If you have questions or problems that seem related to general anesthesia, call the hospital and ask for the anesthetist or anesthesiologist on call. SEEK MEDICAL CARE IF:  You have nausea and vomiting that continue the day after anesthesia.  You develop a rash. SEEK IMMEDIATE MEDICAL CARE IF:  You have difficulty breathing.  You have chest pain.  You have any allergic problems. Document Released: 06/04/2000 Document  Revised: 10/29/2012 Document Reviewed: 09/11/2012  [facility] Patient Information 2014 [employer] [place].

## 2013-10-05 NOTE — Transfer of Care (Signed)
Immediate Anesthesia Transfer of Care Note  Patient: Randall [name]  Procedure(s) Performed: Procedure(s): LEFT ARM LIGATION and EXCISION OF RADIOCEPHALIC ARTERIOVENOUS  FISTULA, PLICATION OF LEFT UPPER ARM FISTULA (Left) INSERTION OF DIALYSIS CATHETER IN RIGHT INTERNAL JUGULAR (Right)  Patient Location: PACU  Anesthesia Type:General  Level of Consciousness: awake, alert  and oriented  Airway & Oxygen Therapy: Patient Spontanous Breathing and Patient connected to nasal cannula oxygen  Post-op Assessment: Report given to PACU RN  Post vital signs: Reviewed and stable  Complications: No apparent anesthesia complications

## 2013-10-05 NOTE — Anesthesia Preprocedure Evaluation (Addendum)
Anesthesia Evaluation  Patient identified by MRN, date of birth, ID band Patient awake    Reviewed: Allergy & Precautions, H&P , NPO status , Patient's Chart, lab work & pertinent test results  Airway Mallampati: I TM Distance: >3 FB Neck ROM: Full    Dental  (+) Teeth Intact, Dental Advisory Given   Pulmonary sleep apnea ,          Cardiovascular hypertension,     Neuro/Psych    GI/Hepatic   Endo/Other  diabetes, Type 2Hyperthyroidism   Renal/GU Dialysis and ESRFRenal disease     Musculoskeletal   Abdominal   Peds  Hematology  (+) anemia ,   Anesthesia Other Findings   Reproductive/Obstetrics                          Anesthesia Physical Anesthesia Plan  ASA: III  Anesthesia Plan: MAC and General   Post-op Pain Management:    Induction: Intravenous  Airway Management Planned: LMA and Mask  Additional Equipment:   Intra-op Plan:   Post-operative Plan: Extubation in OR  Informed Consent: I have reviewed the patients History and Physical, chart, labs and discussed the procedure including the risks, benefits and alternatives for the proposed anesthesia with the patient or authorized representative who has indicated his/her understanding and acceptance.     Plan Discussed with: CRNA, Anesthesiologist and Surgeon  Anesthesia Plan Comments:         Anesthesia Quick Evaluation

## 2013-10-06 ENCOUNTER — Encounter (HOSPITAL_COMMUNITY): Payer: Self-pay | Admitting: Vascular Surgery

## 2013-10-06 ENCOUNTER — Telehealth: Payer: Self-pay | Admitting: Vascular Surgery

## 2013-10-06 NOTE — Telephone Encounter (Signed)
Message copied by Fredrich BirksMILLIKAN, DANA P on Tue Oct 06, 2013  1:51 PM ------      Message from: Phillips OdorPULLINS, CAROL S      Created: Mon Oct 05, 2013 11:57 AM      Regarding: Judeth CornfieldStephanie log; also needs 4 wk. f/u with access duplex w/ Dr. Darrick PennaFields                   ----- Message -----         From: Dara LordsSamantha J Rhyne, PA-C         Sent: 10/05/2013  11:43 AM           To: [facility] Charge Pool            S/p plication of left arm fistula and diatek cath placement 10/05/13.  F/u with Dr. Darrick PennaFields in 4 weeks with a duplex.            Thanks,      Lelon MastSamantha ------

## 2013-10-06 NOTE — Telephone Encounter (Signed)
Lm for pt re appt, dpm  °

## 2013-10-12 ENCOUNTER — Ambulatory Visit (INDEPENDENT_AMBULATORY_CARE_PROVIDER_SITE_OTHER): Payer: Medicare Other | Admitting: Family

## 2013-10-12 ENCOUNTER — Encounter: Payer: Self-pay | Admitting: Family

## 2013-10-12 VITALS — BP 109/74 | HR 88 | Temp 97.1°F | Resp 20 | Ht 71.0 in | Wt 269.0 lb

## 2013-10-12 DIAGNOSIS — Z5189 Encounter for other specified aftercare: Secondary | ICD-10-CM

## 2013-10-12 DIAGNOSIS — N186 End stage renal disease: Secondary | ICD-10-CM

## 2013-10-12 NOTE — Progress Notes (Signed)
Established Dialysis Access  History of Present Illness  Randall [name] is a 48 y.o. (09/22/1965) male patient of Dr. Darrick [name] and Dr. Arbie [name] who is s/p plication left upper arm arm avf; ligation left radiocephalic avf 10/05/13. He returns today as requested by the dialysis center for evaluation of open areas on left forearm ligated AVF. Pt states the bleeding stopped yesterday from the left forearm ligation of AVF site, also states he received an antibiotic today in dialysis.    Review of systems: He denies fever or chills. He has no shortness of breath. He has no chest pain. He denies tingling, pain, or numbness in left hand or arm.  Past Medical History  Diagnosis Date  . Hyperlipidemia   . Thyroid disorder     takes sensipar  . 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 Hyperthyroidism   . Hypertension     hx of off bp meds since 2012  . Sleep apnea     hx of sleep apnea prior to weight loss in 2012  . Diabetes mellitus     controlling by weight/diet  . Peritoneal dialysis status     oct 2014 to dec 2014  . ESRD on hemodialysis march 2012    [facility] onT,Th Sat, ESRD due to DM/HTN  . Neuropathy associated with endocrine disorder     Social History History  Substance Use Topics  . Smoking status: Never Smoker   . Smokeless tobacco: Never Used  . Alcohol Use: No    Family History Family History  Problem Relation Age of Onset  . Hypertension Mother   . Diabetes Mother   . Hypertension Father   . Diabetes Father     Surgical History Past Surgical History  Procedure Laterality Date  . Gastric restriction surgery  12/06/09    lap band  . Dialysis fistula creation  2011  . Insertion of dialysis catheter  11/13/2011    Procedure: INSERTION OF DIALYSIS CATHETER;  Surgeon: Randall Kerns, MD;  Location: [facility] OR;  Service: Vascular;  Laterality: Right;  Insertion of 23cm dialysis catheter in right IJ  . Av fistula placement  12/05/2011    Procedure: ARTERIOVENOUS  (AV) FISTULA CREATION;  Surgeon: Randall Earthly, MD;  Location: [facility] OR;  Service: Vascular;  Laterality: Left;  . Av fistula placement  01-23-2012    #1 ligation of 2 competing branches of left upper arm AVF   #2  superifical mobilization of left upper arm AVF   . Capd removal N/A 05/18/2013    Procedure: LAPAROSCOPIC REMOVAL CONTINUOUS AMBULATORY PERITONEAL DIALYSIS  (CAPD) CATHETER;  Surgeon: Randall Merino, MD;  Location: [facility] ORS;  Service: General;  Laterality: N/A;  . Pd cath inserted      and removed.  . Ligation of arteriovenous  fistula Left 10/05/2013    Procedure: LEFT ARM LIGATION and EXCISION OF RADIOCEPHALIC ARTERIOVENOUS  FISTULA, PLICATION OF LEFT UPPER ARM FISTULA;  Surgeon: Randall Kerns, MD;  Location: [facility] OR;  Service: Vascular;  Laterality: Left;  . Insertion of dialysis catheter Right 10/05/2013    Procedure: INSERTION OF DIALYSIS CATHETER IN RIGHT INTERNAL JUGULAR;  Surgeon: Randall Kerns, MD;  Location: [facility] OR;  Service: Vascular;  Laterality: Right;    Allergies  Allergen Reactions  . Iohexol Itching    Pt had severe itching to head,face,upper chest after 80 ml Omni 300 administered.No obvious hives. I spoke to Dr Randall [name], and he didn't feel the need for  Benadryl to be given.  KR    Current Outpatient Prescriptions  Medication Sig Dispense Refill  . acetaminophen (TYLENOL) 325 MG tablet Take 650 mg by mouth every 6 (six) hours as needed for moderate pain.      Marland Kitchen. lanthanum (FOSRENOL) 1000 MG chewable tablet Chew 2,000-3,000 mg by mouth See admin instructions. Takes 3 tablets with meals and 2 tablets with snacks      . multivitamin (RENA-VIT) TABS tablet Take 1 tablet by mouth at bedtime.  30 tablet  0  . oxyCODONE (ROXICODONE) 5 MG immediate release tablet Take 1 tablet (5 mg total) by mouth every 6 (six) hours as needed for severe pain.  30 tablet  0  . pregabalin (LYRICA) 25 MG capsule Take 25 mg by mouth daily.      . pregabalin (LYRICA) 50 MG capsule Take 1 capsule (50  mg total) by mouth daily. Patient takes 2 at night since March 2015, Dr. Darrick [name].  60 capsule  0  . SENSIPAR 60 MG tablet Take 60 mg by mouth at bedtime.       . white petrolatum (VASELINE) GEL Apply 1 application topically as needed for dry skin (on feet).       No current facility-administered medications for this visit.     REVIEW OF SYSTEMS: see HPI for pertinent positives and negatives    PHYSICAL EXAMINATION:  Filed Vitals:   10/12/13 1257  BP: 109/74  Pulse: 88  Temp: 97.1 F (36.2 C)  Resp: 20  Height: 5\' 11"  (1.803 m)  Weight: 269 lb (122.018 kg)   Body mass index is 37.53 kg/(m^2).  General: The patient appears their stated age, obese male   HEENT:  No gross abnormalities Pulmonary: Respirations are non-labored Abdomen: Soft and non-tender. Musculoskeletal: There are no major deformities.   Neurologic: No focal weakness or paresthesias are detected, Skin: There are no ulcer or rashes noted. Psychiatric: The patient has normal affect. Cardiovascular: There is a regular rate and rhythm without significant murmur appreciated. Left forearm with no bruit or thrill, no bleeding, no open areas, minimal swelling, no erythema. 2+ palpable bilateral radial pulses, 3+ bilateral brachial pulses.  Left upper arm AVF with audible bruit and palpable thrill, no drainage, no erythema.  Medical Decision Making  Aloha GellJohn P Jodelle [name] is a 48 y.o. male who presents with healing left forearm ligation of AVF and adequately maturing 1 week s/p left upper arm AVF plication with no left distal steal symptoms.  Dr. Myra [name] removed surgical glue from left forearm AVF ligation site, dressed with light gauze dressing. Follow up as scheduled with Dr. Darrick PennaFields on Sept. 3.  Randall [name] [facility] [place] Office: [phone]  10/12/2013, 1:29 PM  Clinic MD: Randall [name]

## 2013-10-26 NOTE — Telephone Encounter (Signed)
Telephone encounter opened erroneously

## 2013-11-11 ENCOUNTER — Encounter (INDEPENDENT_AMBULATORY_CARE_PROVIDER_SITE_OTHER): Payer: 59 | Admitting: Surgery

## 2013-11-12 ENCOUNTER — Other Ambulatory Visit (HOSPITAL_COMMUNITY): Payer: Medicare Other

## 2013-11-12 ENCOUNTER — Encounter: Payer: Medicare Other | Admitting: Vascular Surgery

## 2013-12-01 ENCOUNTER — Encounter: Payer: Self-pay | Admitting: Vascular Surgery

## 2013-12-02 ENCOUNTER — Encounter: Payer: Medicare Other | Admitting: Vascular Surgery

## 2013-12-02 ENCOUNTER — Other Ambulatory Visit (HOSPITAL_COMMUNITY): Payer: Medicare Other

## 2013-12-03 ENCOUNTER — Other Ambulatory Visit (HOSPITAL_COMMUNITY): Payer: Self-pay | Admitting: Vascular Surgery

## 2013-12-03 ENCOUNTER — Ambulatory Visit (INDEPENDENT_AMBULATORY_CARE_PROVIDER_SITE_OTHER): Payer: Medicare Other | Admitting: Vascular Surgery

## 2013-12-03 ENCOUNTER — Other Ambulatory Visit: Payer: Self-pay | Admitting: Vascular Surgery

## 2013-12-03 ENCOUNTER — Encounter: Payer: Self-pay | Admitting: Vascular Surgery

## 2013-12-03 ENCOUNTER — Ambulatory Visit (HOSPITAL_COMMUNITY)
Admission: RE | Admit: 2013-12-03 | Discharge: 2013-12-03 | Disposition: A | Discharge status: Home / Self Care | Payer: Medicare Other | Source: Ambulatory Visit | Attending: Vascular Surgery | Admitting: Vascular Surgery

## 2013-12-03 VITALS — BP 116/83 | HR 92 | Ht 71.0 in | Wt 269.4 lb

## 2013-12-03 DIAGNOSIS — Z48812 Encounter for surgical aftercare following surgery on the circulatory system: Secondary | ICD-10-CM | POA: Diagnosis not present

## 2013-12-03 DIAGNOSIS — T82598A Other mechanical complication of other cardiac and vascular devices and implants, initial encounter: Secondary | ICD-10-CM

## 2013-12-03 DIAGNOSIS — Z4931 Encounter for adequacy testing for hemodialysis: Secondary | ICD-10-CM | POA: Diagnosis present

## 2013-12-03 DIAGNOSIS — I120 Hypertensive chronic kidney disease with stage 5 chronic kidney disease or end stage renal disease: Secondary | ICD-10-CM | POA: Diagnosis present

## 2013-12-03 DIAGNOSIS — N186 End stage renal disease: Secondary | ICD-10-CM | POA: Insufficient documentation

## 2013-12-03 DIAGNOSIS — Z992 Dependence on renal dialysis: Secondary | ICD-10-CM | POA: Insufficient documentation

## 2013-12-03 NOTE — Progress Notes (Signed)
Patient is a 48 year old male who is status post ligation of a left radiocephalic fistula and plication of a left upper arm fistula on July 27. The plan is to start cannulating the fistula this Friday.  Physical exam:  Filed Vitals:   12/03/13 1047  BP: 116/83  Pulse: 92  Height:  (1.803 m)  Weight: 269 lb 6.4 oz (122.199 kg)  SpO2: 96%    Left upper extremity: Well-healed upper and lower arm incisions. Audible bruit palpable thrill in fistula. No decreased sensation and.   Data: Duplex ultrasound AV fistula was performed today. There is no narrowing. Fistula diameter is 10-20 mm. Depth is less than 4 mm.  I reviewed and interpreted this study.  Assessment: Doing well status post plication of upper arm fistula. Ready for cannulation  Plan: See above followup when necessary  Fabienne Bruns, MD [facility] [place] Office: [phone] Pager: [phone]

## 2013-12-09 ENCOUNTER — Encounter: Payer: Self-pay | Admitting: Internal Medicine

## 2013-12-09 ENCOUNTER — Ambulatory Visit (INDEPENDENT_AMBULATORY_CARE_PROVIDER_SITE_OTHER): Payer: Medicare Other | Admitting: Internal Medicine

## 2013-12-09 VITALS — BP 130/82 | HR 91 | Temp 98.2°F | Resp 20 | Ht 69.49 in | Wt 274.2 lb

## 2013-12-09 DIAGNOSIS — G4733 Obstructive sleep apnea (adult) (pediatric): Secondary | ICD-10-CM

## 2013-12-09 DIAGNOSIS — N186 End stage renal disease: Secondary | ICD-10-CM

## 2013-12-09 DIAGNOSIS — Z992 Dependence on renal dialysis: Secondary | ICD-10-CM

## 2013-12-09 DIAGNOSIS — E1142 Type 2 diabetes mellitus with diabetic polyneuropathy: Secondary | ICD-10-CM

## 2013-12-09 DIAGNOSIS — D631 Anemia in chronic kidney disease: Secondary | ICD-10-CM

## 2013-12-09 DIAGNOSIS — N039 Chronic nephritic syndrome with unspecified morphologic changes: Secondary | ICD-10-CM

## 2013-12-09 DIAGNOSIS — I10 Essential (primary) hypertension: Secondary | ICD-10-CM

## 2013-12-09 MED ORDER — PREGABALIN 50 MG PO CAPS: 50.0000 mg | ORAL_CAPSULE | Freq: Two times a day (BID) | ORAL | Status: AC

## 2013-12-09 NOTE — Progress Notes (Signed)
Patient ID: Randall [name], male   DOB: 11/06/1965, 48 y.o.   MRN: [id number]    Chief Complaint  Patient presents with  . Establish Care   Allergies  Allergen Reactions  . Iohexol Itching    Pt had severe itching to head,face,upper chest after 80 ml Omni 300 administered.No obvious hives. I spoke to Dr Ruffin Frederick, and he didn't feel the need for Benadryl to be given.  KR   HPI 48 y/o male patient is here to establish care. He has history of ESRD and is on dialysis, OSA, diet controlled diabetes, HTN off all medications. He is s/p gastric bypass in 2001 and had lap banding in 2011. He follows with [facility] He denies any specific concern this visit He would like to lose weight and come down to 220 lbs His neuropathic pain has worsened, currently on 75 mg lyrica daily No falls reported  Review of Systems  Constitutional: Negative for fever, chills, malaise/fatigue and diaphoresis. has gained few pounds HENT: Negative for congestion, hearing loss and sore throat.   Eyes: Negative for blurred vision, double vision and discharge. wears glasses. Due for exam beginning of 2016 Respiratory: Negative for cough, sputum production, shortness of breath and wheezing.   Cardiovascular: Negative for chest pain, palpitations, orthopnea and leg swelling.  Gastrointestinal: Negative for heartburn, nausea, vomiting, abdominal pain, diarrhea, melena, rectal bleed and constipation.  Genitourinary: Negative for dysuria, flank pain. makes very little urine Musculoskeletal: Negative for back pain, falls, joint pain and myalgias.  Skin: Negative for itching and rash. left arm fistula. Dialysis on M/W/F Neurological: Negative for dizziness, tingling, focal weakness and headaches. has neuropathic pain in bith feet.  Psychiatric/Behavioral: Negative for depression and memory loss. The patient is not nervous/anxious.    Wt Readings from Last 3 Encounters:  12/09/13 274 lb 3.2 oz (124.376 kg)  12/03/13 269 lb 6.4  oz (122.199 kg)  10/12/13 269 lb (122.018 kg)    Past Medical History  Diagnosis Date  . Hyperlipidemia   . Thyroid disorder     takes sensipar  . 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 Hyperthyroidism   . Hypertension     hx of off bp meds since 2012  . Sleep apnea     hx of sleep apnea prior to weight loss in 2012  . Diabetes mellitus     controlling by weight/diet  . Peritoneal dialysis status     oct 2014 to dec 2014  . ESRD on hemodialysis march 2012    [facility] onT,Th Sat, ESRD due to DM/HTN  . Neuropathy associated with endocrine disorder    Past Surgical History  Procedure Laterality Date  . Gastric restriction surgery  12/06/09    lap band  . Dialysis fistula creation  2011  . Insertion of dialysis catheter  11/13/2011    Procedure: INSERTION OF DIALYSIS CATHETER;  Surgeon: Sherren Kerns, MD;  Location: [facility] OR;  Service: Vascular;  Laterality: Right;  Insertion of 23cm dialysis catheter in right IJ  . Av fistula placement  12/05/2011    Procedure: ARTERIOVENOUS (AV) FISTULA CREATION;  Surgeon: Larina Earthly, MD;  Location: [facility] OR;  Service: Vascular;  Laterality: Left;  . Av fistula placement  01-23-2012    #1 ligation of 2 competing branches of left upper arm AVF   #2  superifical mobilization of left upper arm AVF   . Capd removal N/A 05/18/2013    Procedure: LAPAROSCOPIC REMOVAL CONTINUOUS AMBULATORY PERITONEAL DIALYSIS  (  CAPD) CATHETER;  Surgeon: Valarie MerinoMatthew B Martin, MD;  Location: [facility] ORS;  Service: General;  Laterality: N/A;  . Pd cath inserted      and removed.  . Ligation of arteriovenous  fistula Left 10/05/2013    Procedure: LEFT ARM LIGATION and EXCISION OF RADIOCEPHALIC ARTERIOVENOUS  FISTULA, PLICATION OF LEFT UPPER ARM FISTULA;  Surgeon: Sherren Kernsharles E Fields, MD;  [facility];  Service: Vascular;  Laterality: Left;  . Insertion of dialysis catheter Right 10/05/2013    Procedure: INSERTION OF DIALYSIS CATHETER IN RIGHT INTERNAL JUGULAR;  Surgeon: Sherren Kernsharles E  Fields, MD;  Location: [facility] OR;  Service: Vascular;  Laterality: Right;   Current Outpatient Prescriptions on File Prior to Visit  Medication Sig Dispense Refill  . acetaminophen (TYLENOL) 325 MG tablet Take 650 mg by mouth every 6 (six) hours as needed for moderate pain.      Marland Kitchen. lanthanum (FOSRENOL) 1000 MG chewable tablet Chew 2,000-3,000 mg by mouth See admin instructions. Takes 3 tablets with meals and 2 tablets with snacks      . multivitamin (RENA-VIT) TABS tablet Take 1 tablet by mouth at bedtime.  30 tablet  0  . SENSIPAR 60 MG tablet Take 60 mg by mouth at bedtime.       . white petrolatum (VASELINE) GEL Apply 1 application topically as needed for dry skin (on feet).      Marland Kitchen. oxyCODONE (ROXICODONE) 5 MG immediate release tablet Take 1 tablet (5 mg total) by mouth every 6 (six) hours as needed for severe pain.  30 tablet  0  . pregabalin (LYRICA) 25 MG capsule Take 25 mg by mouth as needed.       . pregabalin (LYRICA) 50 MG capsule Take 1 capsule (50 mg total) by mouth daily. Patient takes 2 at night since March 2015, Dr. Darrick Pennaeterding.  60 capsule  0   No current facility-administered medications on file prior to visit.    History   Social History  . Marital Status: Divorced    Spouse Name: N/A    Number of Children: 3  . Years of Education: College   Occupational History  . Not on file.   Social History Main Topics  . Smoking status: Never Smoker   . Smokeless tobacco: Never Used  . Alcohol Use: No  . Drug Use: No  . Sexual Activity: Yes    Birth Control/ Protection: Condom   Other Topics Concern  . Not on file   Social History Narrative   Patient is single and lives with his son.   Patient has three children.   Patient is disabled.   Patient has a college education.   Patient is right-handed.   Patient drinks very little caffeine, maybe once a week.   Family History  Problem Relation Age of Onset  . Hypertension Mother   . Diabetes Mother   . Hypertension Father     . Diabetes Father   . Diabetes Brother   . Kidney disease Brother    Physical exam BP 130/82  Pulse 91  Temp(Src) 98.2 F (36.8 C) (Oral)  Resp 20  Ht 5' 9.49" (1.765 m)  Wt 274 lb 3.2 oz (124.376 kg)  BMI 39.93 kg/m2  SpO2 98%  General- adult male in no acute distress, obese Head- atraumatic, normocephalic Eyes- PERRLA, EOMI, no pallor, no icterus, no discharge Neck- no lymphadenopathy, no thyromegaly, no jugular vein distension, no carotid bruit Nose- normal nasaal mucosa, no maxillary sinus tenderness, no frontal sinus tenderness  Mouth- normal mucus membrane, no oral thrush, normal oropharynx Cardiovascular- normal s1,s2, no murmurs, normal distal pulses Respiratory- bilateral clear to auscultation, no wheeze, no rhonchi, no crackles Abdomen- bowel sounds present, soft, non tender Musculoskeletal- able to move all 4 extremities, no leg edema Neurological- no focal deficit, normal reflexes, normal muscle strength, normal sensation to fine touch and vibration Skin- warm and dry, fistula in left arm Psychiatry- alert and oriented to person, place and time, normal mood and affect  Labs-  Lab Results  Component Value Date   WBC 9.3 07/07/2013   HGB 15.6 10/05/2013   HCT 46.0 10/05/2013   MCV 94.1 07/07/2013   PLT 212 07/07/2013   CMP     Component Value Date/Time   NA 138 10/05/2013 0842   K 4.3 10/05/2013 0842   CL 98 07/07/2013 0521   CO2 24 07/07/2013 0521   GLUCOSE 97 10/05/2013 0842   BUN 48* 07/07/2013 0521   CREATININE 13.42* 07/07/2013 0521   CALCIUM 7.2* 07/07/2013 0521   CALCIUM 7.4* 05/26/2010 0921   PROT 9.8* 07/04/2013 1839   ALBUMIN 3.6 07/04/2013 1839   AST 62* 07/04/2013 1839   ALT <5 07/04/2013 1839   ALKPHOS 110 07/04/2013 1839   BILITOT 0.5 07/04/2013 1839   GFRNONAA 4* 07/07/2013 0521   GFRAA 4* 07/07/2013 0521   Assessment/plan  1. Polyneuropathy in diabetes(357.2) Will increase his lyrica to 50 mg bid. Check a1c prior to next visit. All lab work at  dialysis centre. Diet controlled dm, not on any meds. Also check lipid panel and renal function - pregabalin (LYRICA) 50 MG capsule; Take 1 capsule (50 mg total) by mouth 2 (two) times daily.  Dispense: 60 capsule; Refill: 3  2. OSA (obstructive sleep apnea) Pending repeat sleep study. Not on any CPAP - pregabalin (LYRICA) 50 MG capsule; Take 1 capsule (50 mg total) by mouth 2 (two) times daily.  Dispense: 60 capsule; Refill: 3  3. ESRD on dialysis Continue HD 3 days a week. Continue phoslo and sensipar with renavite - pregabalin (LYRICA) 50 MG capsule; Take 1 capsule (50 mg total) by mouth 2 (two) times daily.  Dispense: 60 capsule; Refill: 3  4. Essential hypertension Well controlled this visit, off all meds, check bmp  5. Anemia in chronic kidney disease(285.21) Continue renavite, monitor h&h

## 2013-12-11 ENCOUNTER — Encounter (INDEPENDENT_AMBULATORY_CARE_PROVIDER_SITE_OTHER): Payer: 59 | Admitting: Surgery

## 2013-12-18 ENCOUNTER — Encounter (INDEPENDENT_AMBULATORY_CARE_PROVIDER_SITE_OTHER): Payer: 59 | Admitting: Surgery

## 2014-02-18 ENCOUNTER — Encounter (HOSPITAL_COMMUNITY): Payer: Self-pay | Admitting: Vascular Surgery

## 2014-03-16 ENCOUNTER — Encounter: Payer: Medicare Other | Admitting: Internal Medicine

## 2015-01-07 IMAGING — CT CT ABD-PELV W/ CM
2 of 5 series · 16 of 46 positions shown, 18 images · IV contrast (APPLIED)
Comparison: CT scan of January 21, 2013.

CLINICAL DATA: Abdominal pain.

EXAM:
CT ABDOMEN AND PELVIS WITH CONTRAST
TECHNIQUE: Multidetector CT imaging of the abdomen and pelvis was performed
using the standard protocol following bolus administration of
intravenous contrast.
CONTRAST:  80mL OMNIPAQUE IOHEXOL 300 MG/ML  SOLN

[Series 2: abd/ pelvis 5.0 i30f 1 · axial · 0.84mm/px · z∈[+673,+1113]mm · 13 of 100 slices shown, 15 images]
[im 6/100  soft-tissue]
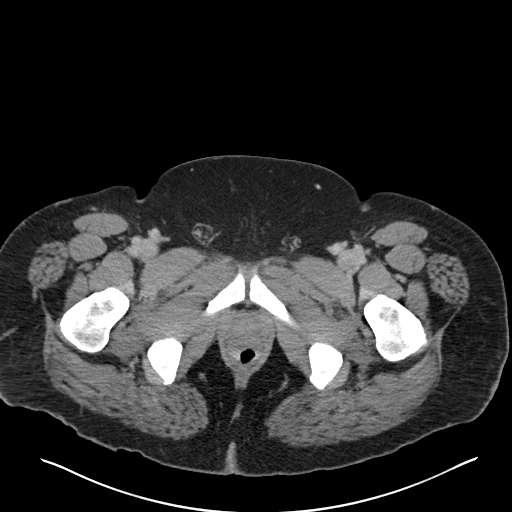
[im 6/100  bone]
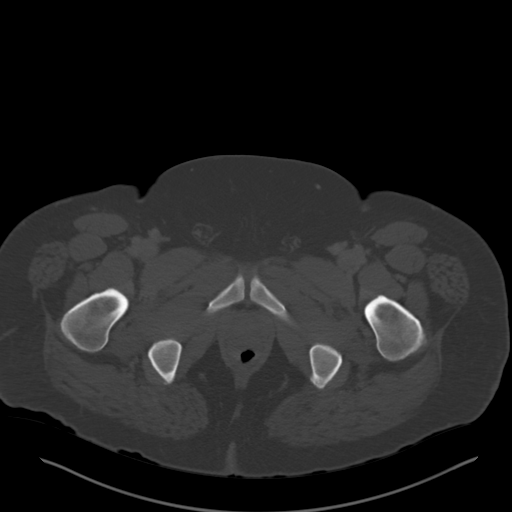
[im 16/100  soft-tissue]
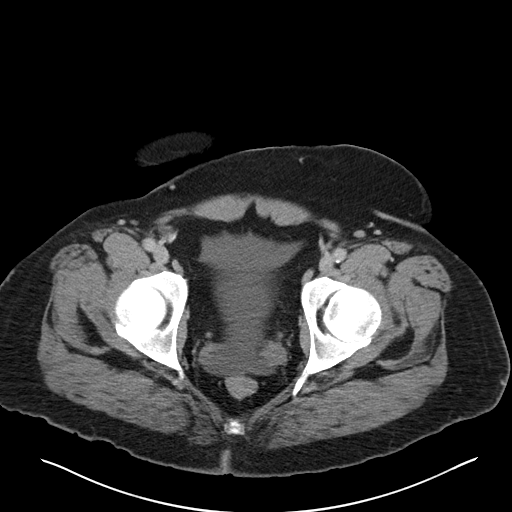
[im 21/100  soft-tissue]
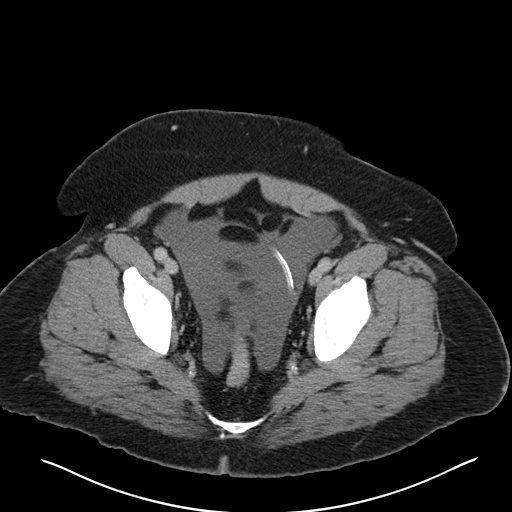
[im 27/100  soft-tissue]
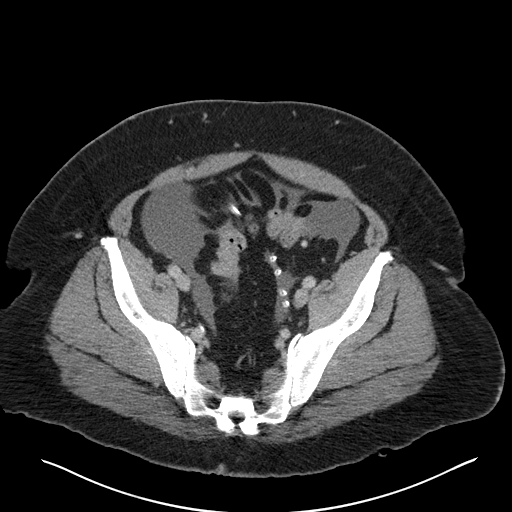
[im 37/100  soft-tissue]
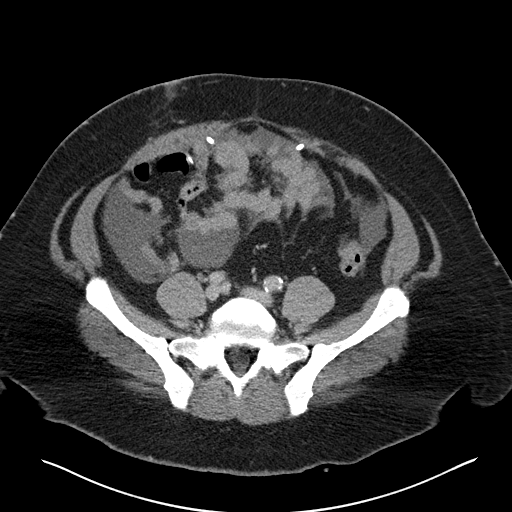
[im 42/100  soft-tissue]
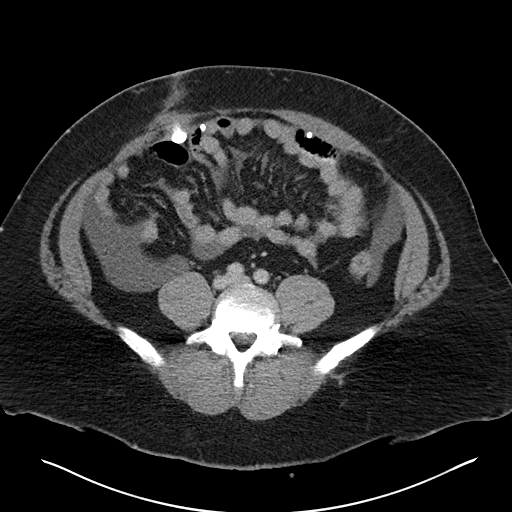
[im 53/100  soft-tissue]
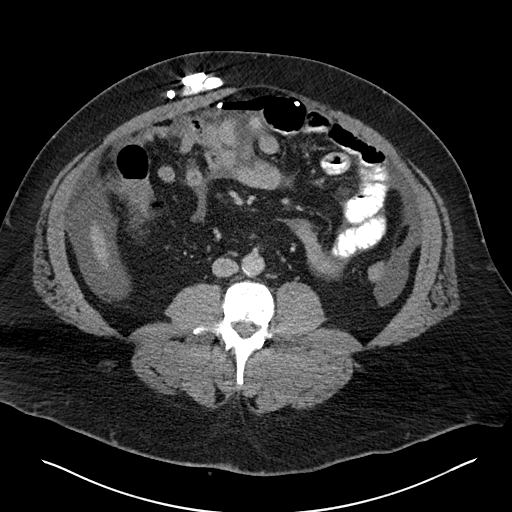
[im 58/100  soft-tissue]
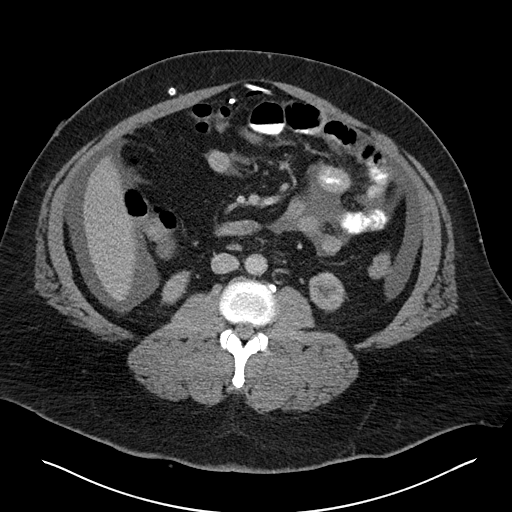
[im 63/100  soft-tissue]
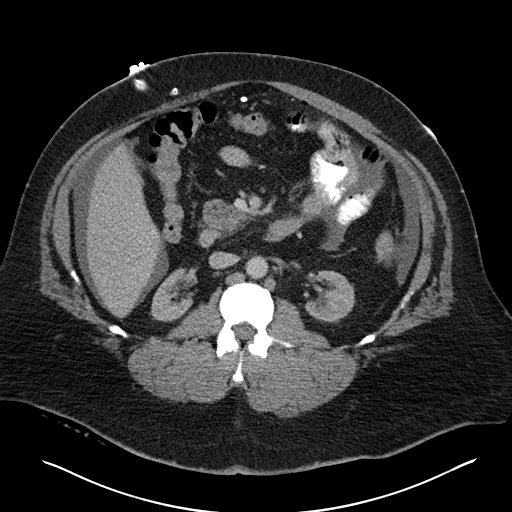
[im 63/100  bone]
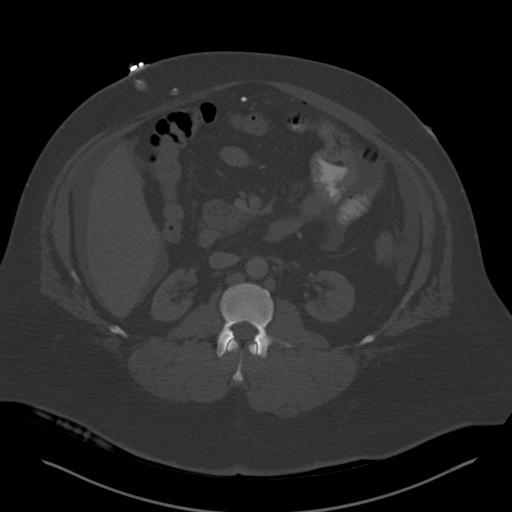
[im 73/100  soft-tissue]
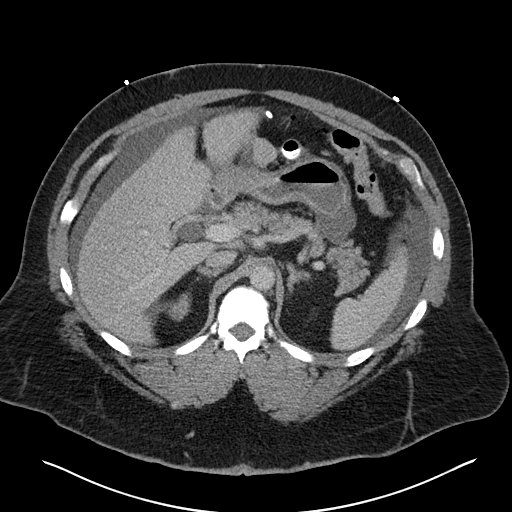
[im 79/100  soft-tissue]
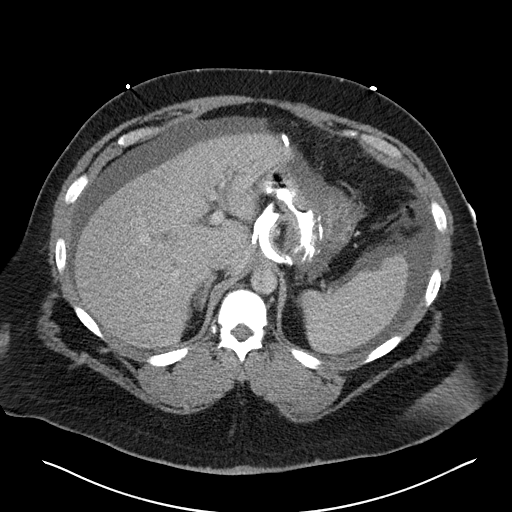
[im 84/100  soft-tissue]
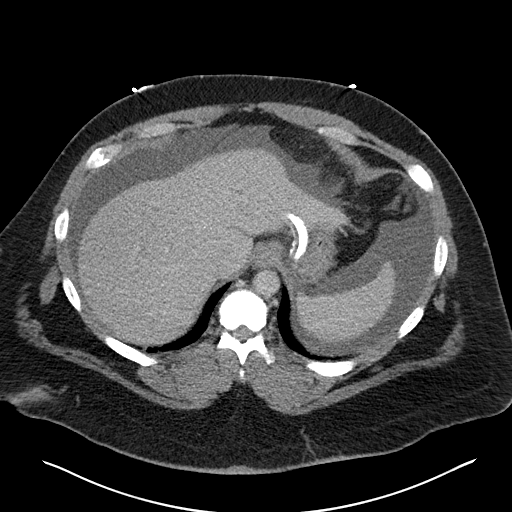
[im 94/100  soft-tissue]
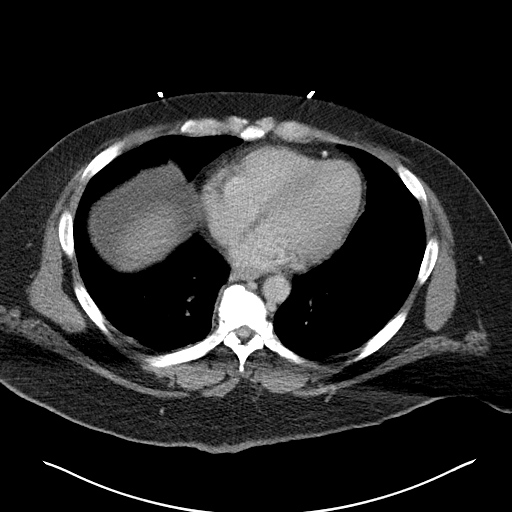

[Series 5: cororal soft tissue · coronal · 0.87mm/px · 3 of 105 slices shown]
[im 35/105  soft-tissue]
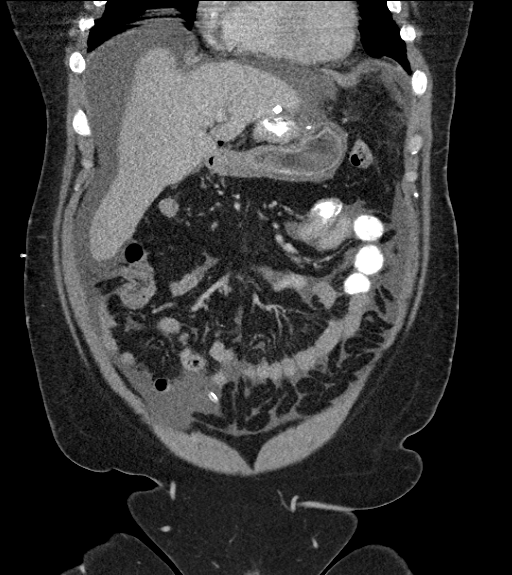
[im 47/105  soft-tissue]
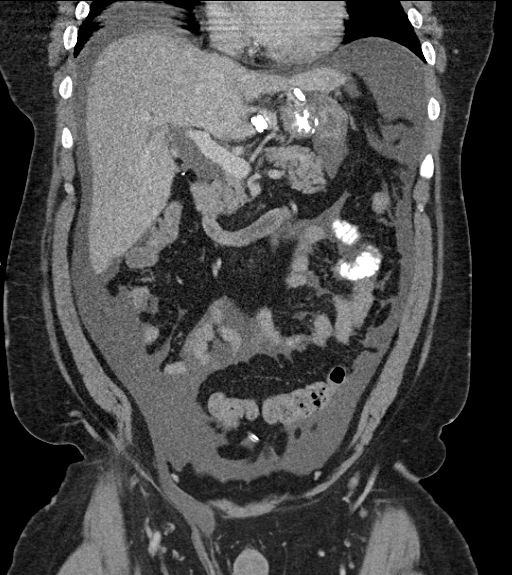
[im 58/105  soft-tissue]
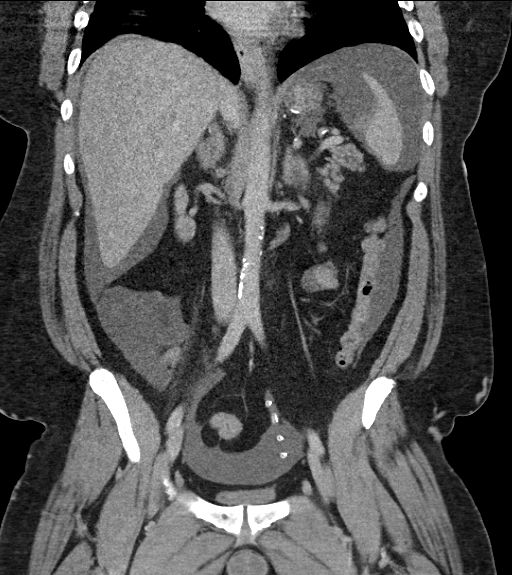

[16 of 46 positions shown; findings below may reference images not displayed]

FINDINGS: No acute abnormality is seen in the visualized lung bases. Status
post gastric band procedure which is unchanged compared to prior
exam. Status post cholecystectomy. No focal abnormality is seen in
the liver, spleen or pancreas. There is seen and increased amount of
fluid around the spleen and liver compared to prior exam, which most
likely is related to peritoneal dialysis. Peritoneal dialysis
catheter is again noted in the left side of the pelvis with free
fluid present in this area which is increased compared to prior
exam. Small amount of pneumoperitoneum is noted in the epigastric
region which was not clearly seen on prior exam. Bilateral renal
atrophy is noted and unchanged. Adrenal glands appear normal. No
evidence of bowel obstruction is noted. No osseous abnormality is
noted.
IMPRESSION: Status post gastric banding procedure and cholecystectomy.
Peritoneal dialysis catheter is again noted in the left side of the
pelvis which is unchanged compared to prior exam. There is noted an
increased amount of fluid in the pelvis as well as around the spleen
and liver most likely related to peritoneal dialysis.

There does appear to be a small amount of pneumoperitoneum present
in the epigastric 3 which was not present on prior exam, but most
likely is related to peritoneal dialysis.

## 2015-01-07 IMAGING — CR DG ABDOMEN ACUTE W/ 1V CHEST
3 series · 3 of 3 positions shown · non-contrast
Comparison: CT scan and chest radiograph of January 21, 2013.

CLINICAL DATA: Shortness of breath, abdominal pain.

EXAM:
ACUTE ABDOMEN SERIES (ABDOMEN 2 VIEW & CHEST 1 VIEW)

[w chest pa]
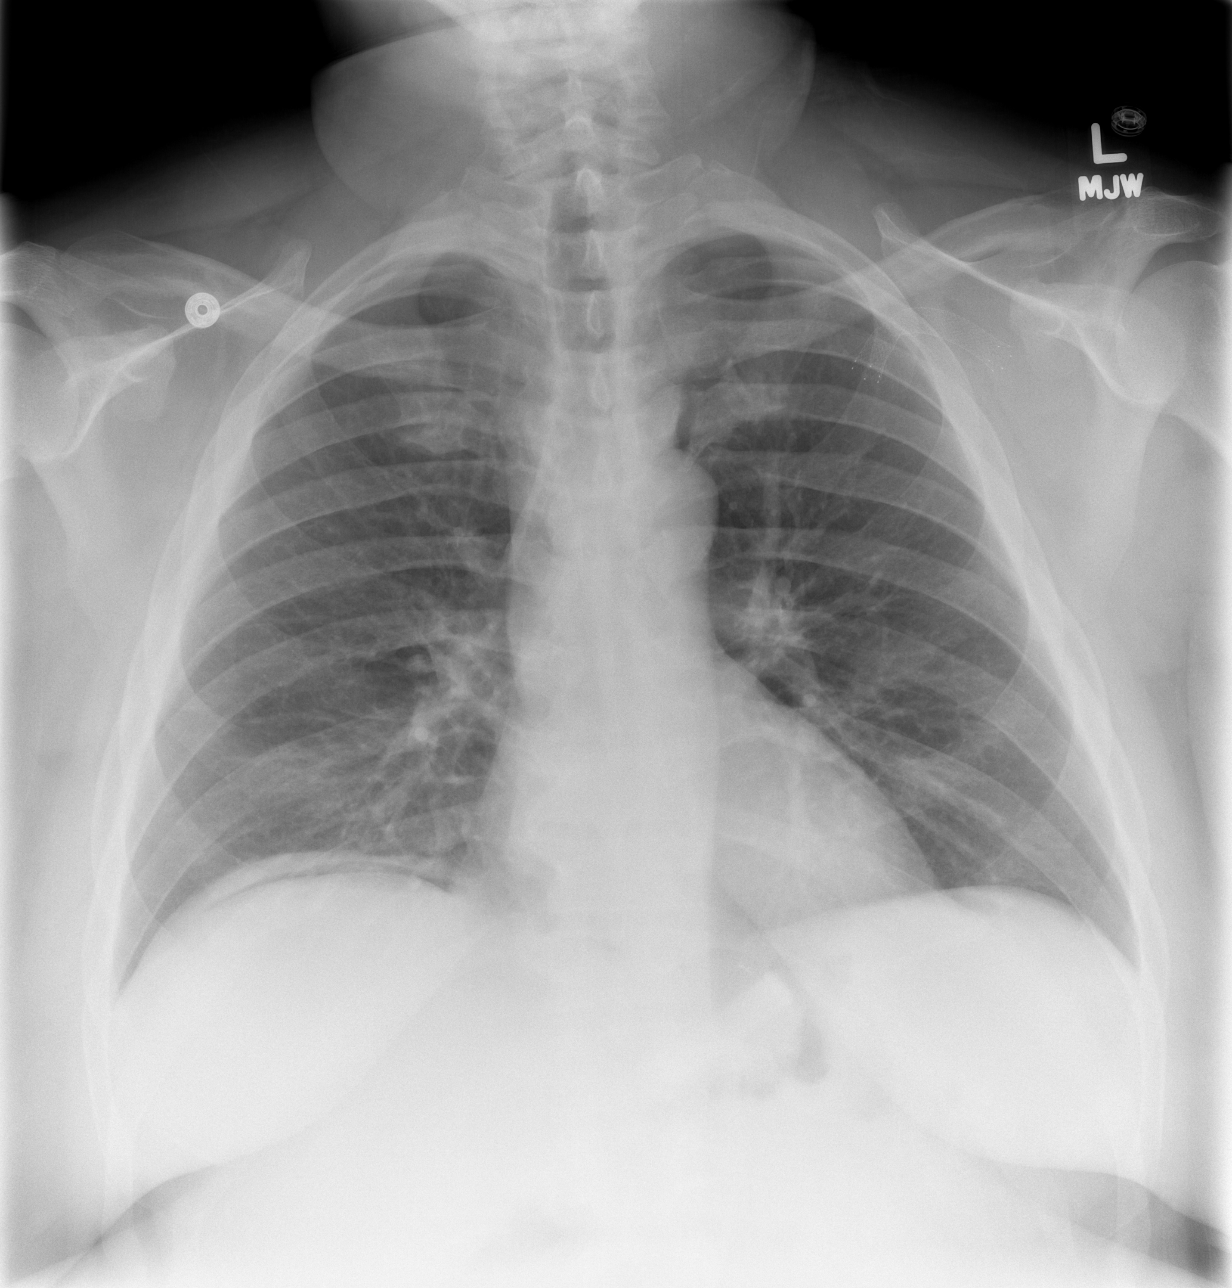

[w abdomen upright]
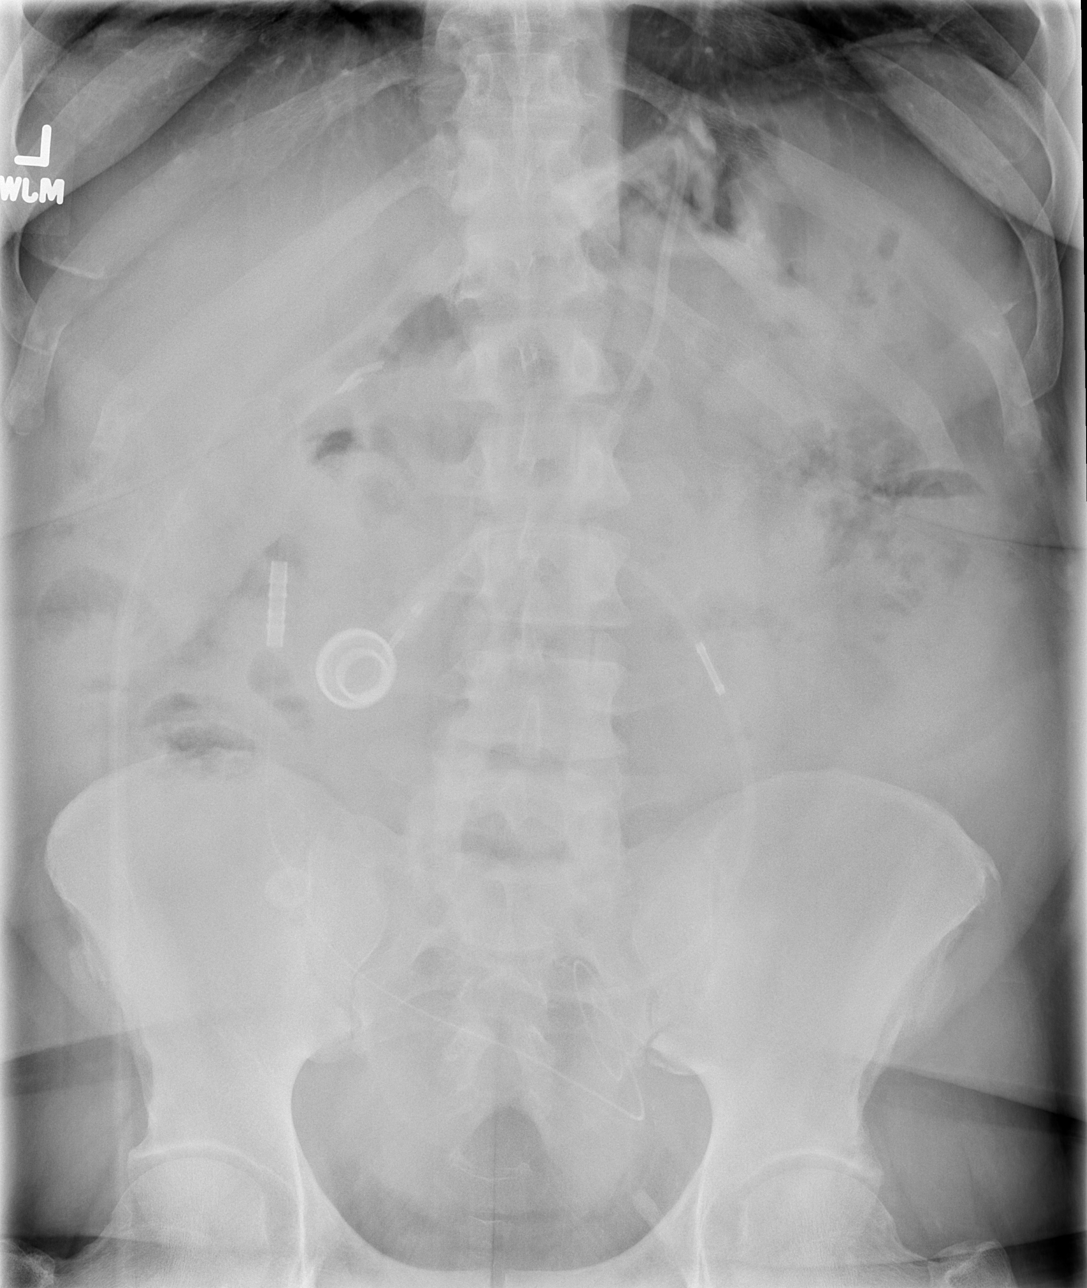

[t abdomen supine]
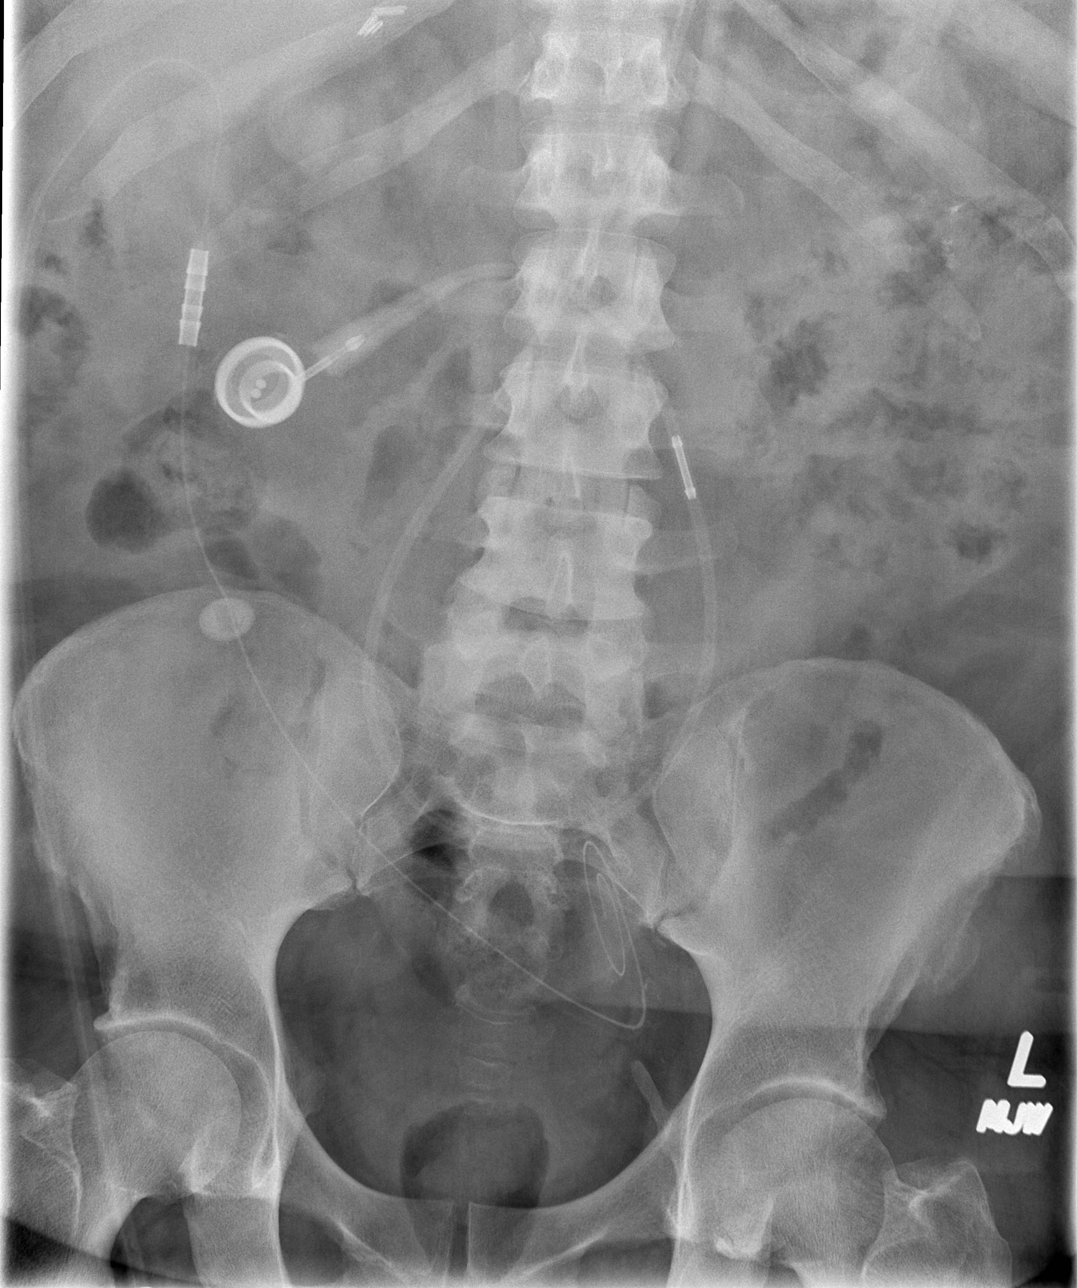

[3 of 3 positions shown; findings below may reference images not displayed]

FINDINGS: There is no evidence of dilated bowel loops. Peritoneal dialysis
catheter is seen loops within the left side of the pelvis. Status
post gastric band procedure. Status post cholecystectomy. There is
noted free air underneath the right he mid which may be related to
peritoneal dialysis, although rupture of hollow viscus cannot be
excluded. No radiopaque calculi is seen. Heart size and mediastinal
contours are within normal limits. Both lungs are clear.
IMPRESSION: No acute cardiopulmonary abnormality seen. No evidence of bowel
obstruction or ileus is noted. Small amount of free air is seen
underneath the right hemidiaphragm which may be related to
peritoneal dialysis, but rupture of hollow viscus cannot be
excluded. Roberto Tiger was notified of this finding by myself
personally at [DATE] a.m..

## 2015-12-16 ENCOUNTER — Encounter (HOSPITAL_COMMUNITY): Payer: Self-pay

## 2016-07-12 ENCOUNTER — Encounter (HOSPITAL_COMMUNITY): Payer: Self-pay
